# Patient Record
Sex: Female | Born: 1955 | Race: White | Hispanic: No | Marital: Married | Smoking: Current every day smoker
Health system: Southern US, Community
[De-identification: ages and names within clinical notes are randomized; demographics above are authoritative.]

## PROBLEM LIST (undated history)

## (undated) ENCOUNTER — Emergency Department (HOSPITAL_BASED_OUTPATIENT_CLINIC_OR_DEPARTMENT_OTHER): Admission: EM | Payer: Medicare Other | Source: Home / Self Care

## (undated) DIAGNOSIS — M519 Unspecified thoracic, thoracolumbar and lumbosacral intervertebral disc disorder: Secondary | ICD-10-CM

## (undated) DIAGNOSIS — F419 Anxiety disorder, unspecified: Secondary | ICD-10-CM

## (undated) DIAGNOSIS — R911 Solitary pulmonary nodule: Secondary | ICD-10-CM

## (undated) DIAGNOSIS — N390 Urinary tract infection, site not specified: Secondary | ICD-10-CM

## (undated) DIAGNOSIS — F909 Attention-deficit hyperactivity disorder, unspecified type: Secondary | ICD-10-CM

## (undated) DIAGNOSIS — F319 Bipolar disorder, unspecified: Secondary | ICD-10-CM

## (undated) DIAGNOSIS — F329 Major depressive disorder, single episode, unspecified: Secondary | ICD-10-CM

## (undated) DIAGNOSIS — N189 Chronic kidney disease, unspecified: Secondary | ICD-10-CM

## (undated) DIAGNOSIS — I728 Aneurysm of other specified arteries: Secondary | ICD-10-CM

## (undated) DIAGNOSIS — G8929 Other chronic pain: Secondary | ICD-10-CM

## (undated) DIAGNOSIS — M47812 Spondylosis without myelopathy or radiculopathy, cervical region: Secondary | ICD-10-CM

## (undated) DIAGNOSIS — J439 Emphysema, unspecified: Secondary | ICD-10-CM

## (undated) DIAGNOSIS — M545 Low back pain: Secondary | ICD-10-CM

## (undated) HISTORY — DX: Unspecified thoracic, thoracolumbar and lumbosacral intervertebral disc disorder: M51.9

## (undated) HISTORY — PX: SHOULDER SURGERY: SHX246

## (undated) HISTORY — PX: APPENDECTOMY: SHX54

## (undated) HISTORY — DX: Major depressive disorder, single episode, unspecified: F32.9

## (undated) HISTORY — DX: Spondylosis without myelopathy or radiculopathy, cervical region: M47.812

## (undated) HISTORY — DX: Urinary tract infection, site not specified: N39.0

## (undated) HISTORY — DX: Attention-deficit hyperactivity disorder, unspecified type: F90.9

## (undated) HISTORY — DX: Anxiety disorder, unspecified: F41.9

## (undated) HISTORY — DX: Low back pain: M54.5

## (undated) HISTORY — DX: Other chronic pain: G89.29

## (undated) HISTORY — PX: COLONOSCOPY: SHX174

## (undated) HISTORY — PX: CHOLECYSTECTOMY: SHX55

---

## 1990-09-04 HISTORY — PX: OTHER SURGICAL HISTORY: SHX169

## 1993-09-04 HISTORY — PX: NECK SURGERY: SHX720

## 1995-09-05 HISTORY — PX: ABDOMINAL HYSTERECTOMY: SHX81

## 2000-07-16 ENCOUNTER — Encounter: Payer: Self-pay | Admitting: Emergency Medicine

## 2000-07-16 ENCOUNTER — Emergency Department (HOSPITAL_COMMUNITY): Admission: EM | Admit: 2000-07-16 | Discharge: 2000-07-16 | Payer: Self-pay | Admitting: Emergency Medicine

## 2000-09-21 ENCOUNTER — Encounter: Admission: RE | Admit: 2000-09-21 | Discharge: 2000-09-21 | Payer: Self-pay | Admitting: Family Medicine

## 2000-10-12 ENCOUNTER — Encounter: Admission: RE | Admit: 2000-10-12 | Discharge: 2000-10-12 | Payer: Self-pay | Admitting: Family Medicine

## 2000-11-09 ENCOUNTER — Encounter: Payer: Self-pay | Admitting: Family Medicine

## 2000-11-09 ENCOUNTER — Encounter: Admission: RE | Admit: 2000-11-09 | Discharge: 2000-11-09 | Payer: Self-pay | Admitting: Family Medicine

## 2000-12-13 ENCOUNTER — Encounter
Admission: RE | Admit: 2000-12-13 | Discharge: 2000-12-13 | Payer: Self-pay | Admitting: Physical Medicine and Rehabilitation

## 2000-12-13 ENCOUNTER — Encounter: Payer: Self-pay | Admitting: Physical Medicine and Rehabilitation

## 2001-01-23 ENCOUNTER — Emergency Department (HOSPITAL_COMMUNITY): Admission: EM | Admit: 2001-01-23 | Discharge: 2001-01-23 | Payer: Self-pay | Admitting: Emergency Medicine

## 2001-01-23 ENCOUNTER — Encounter: Payer: Self-pay | Admitting: Emergency Medicine

## 2001-10-07 ENCOUNTER — Emergency Department (HOSPITAL_COMMUNITY): Admission: EM | Admit: 2001-10-07 | Discharge: 2001-10-07 | Payer: Self-pay | Admitting: *Deleted

## 2002-04-20 ENCOUNTER — Emergency Department (HOSPITAL_COMMUNITY): Admission: EM | Admit: 2002-04-20 | Discharge: 2002-04-20 | Payer: Self-pay | Admitting: Emergency Medicine

## 2003-09-05 HISTORY — PX: OTHER SURGICAL HISTORY: SHX169

## 2006-01-05 ENCOUNTER — Ambulatory Visit (HOSPITAL_COMMUNITY): Admission: RE | Admit: 2006-01-05 | Discharge: 2006-01-06 | Payer: Self-pay | Admitting: General Surgery

## 2006-01-05 ENCOUNTER — Encounter (INDEPENDENT_AMBULATORY_CARE_PROVIDER_SITE_OTHER): Payer: Self-pay | Admitting: Specialist

## 2006-01-05 IMAGING — RF DG CHOLANGIOGRAM OPERATIVE
1 series · 4 of 4 positions shown · non-contrast
Comparison: none

CLINICAL DATA: Cholecystectomy for gallstones.  
INTRAOPERATIVE CHOLANGIOGRAM ? [DATE]:
Cholangiogram images submitted for interpretation were performed with a C-arm in the operating room.  Opacified biliary tree shows no filling defects or obstruction.   of contrast into the duodenum.  No extravasation.

[Series 1: run · 4 of 95 frames shown]
[frame 15/95]
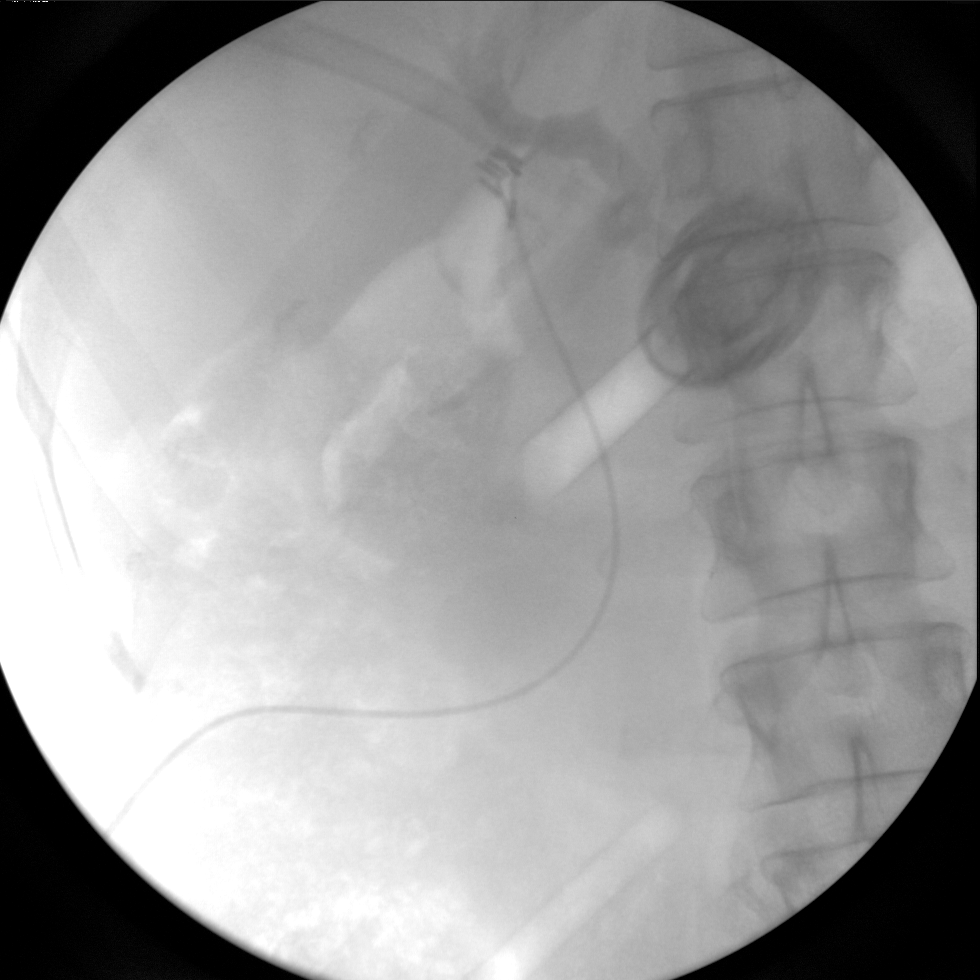
[frame 48/95]
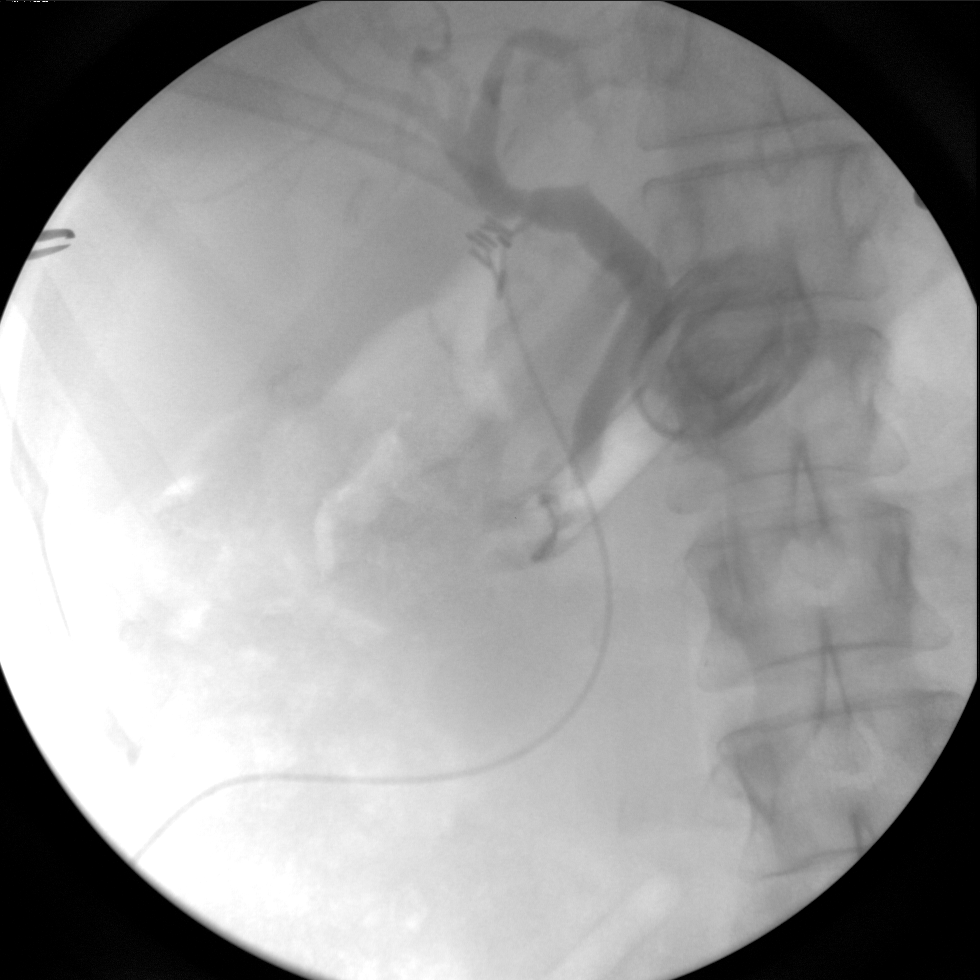
[frame 70/95]
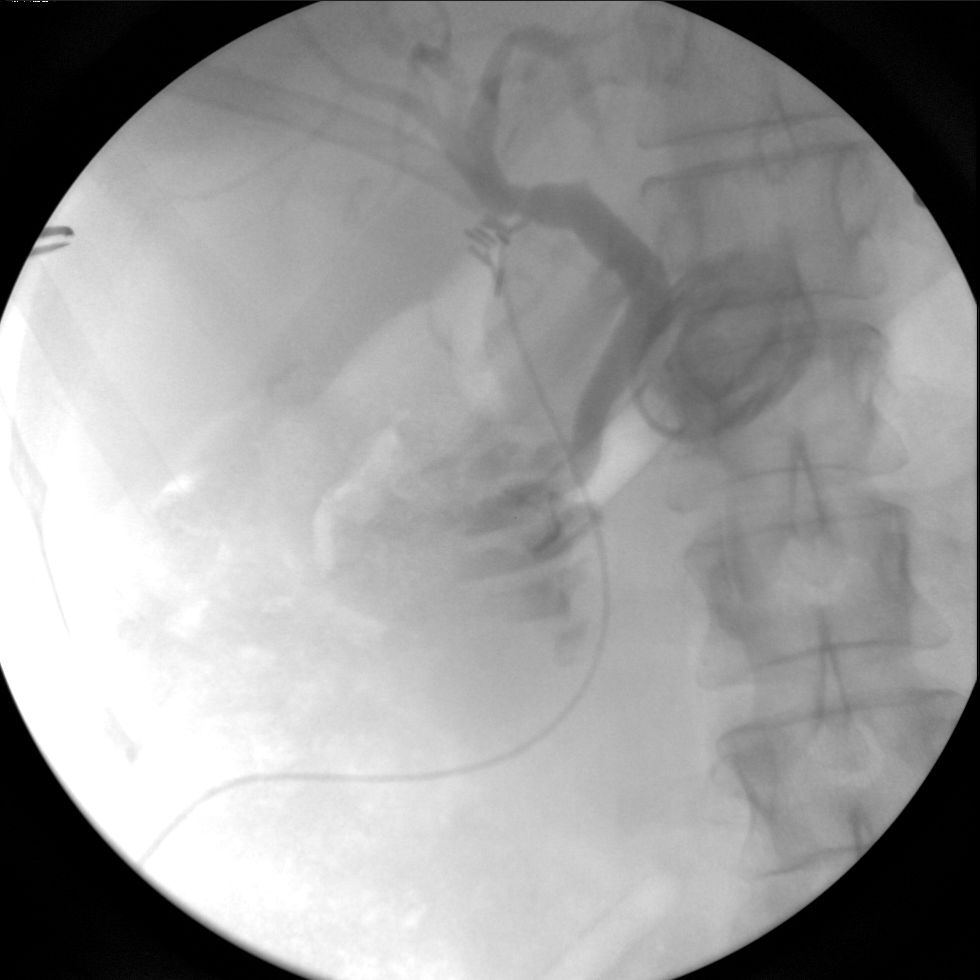
[frame 81/95]
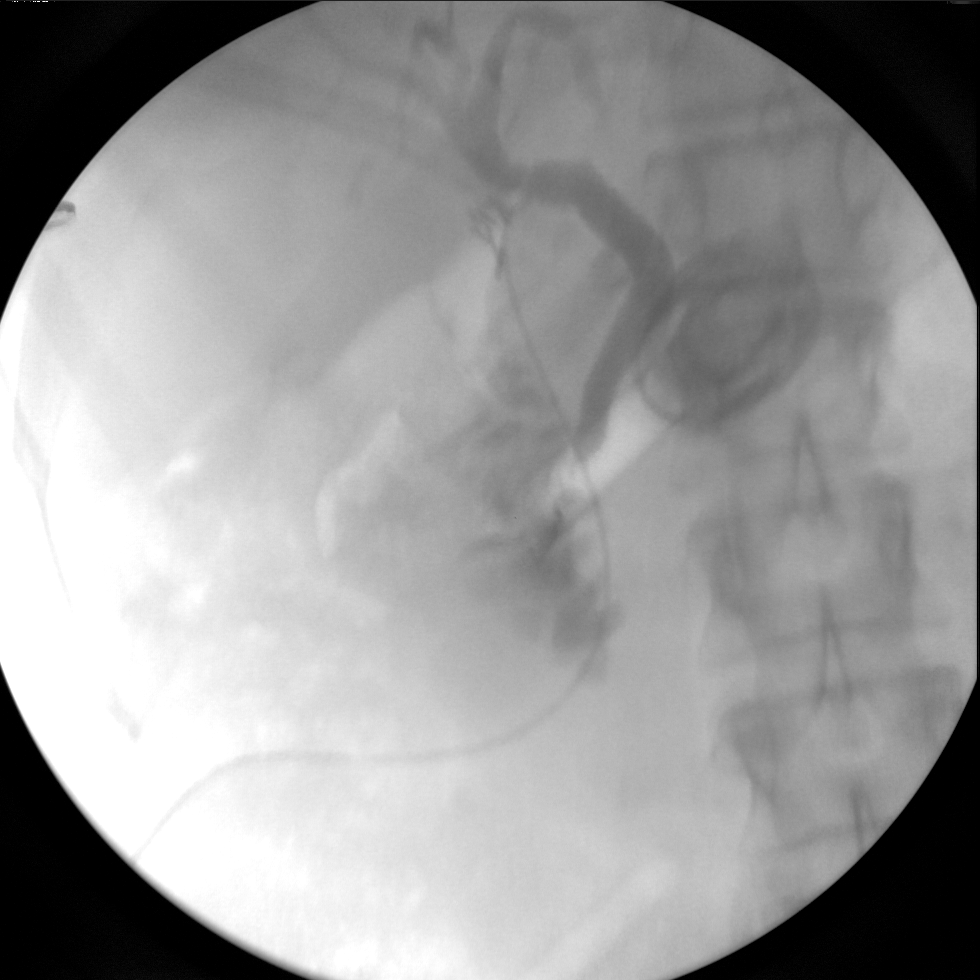

[4 of 4 positions shown; findings below may reference images not displayed]

IMPRESSION: Normal intraoperative cholangiogram.

## 2008-12-26 ENCOUNTER — Emergency Department (HOSPITAL_COMMUNITY): Admission: EM | Admit: 2008-12-26 | Discharge: 2008-12-26 | Payer: Self-pay | Admitting: Emergency Medicine

## 2009-03-04 DIAGNOSIS — F411 Generalized anxiety disorder: Secondary | ICD-10-CM | POA: Insufficient documentation

## 2009-03-04 DIAGNOSIS — F988 Other specified behavioral and emotional disorders with onset usually occurring in childhood and adolescence: Secondary | ICD-10-CM | POA: Insufficient documentation

## 2009-03-04 DIAGNOSIS — F329 Major depressive disorder, single episode, unspecified: Secondary | ICD-10-CM | POA: Insufficient documentation

## 2009-03-18 ENCOUNTER — Encounter: Admission: RE | Admit: 2009-03-18 | Discharge: 2009-03-18 | Payer: Self-pay | Admitting: Family Medicine

## 2009-10-25 ENCOUNTER — Encounter (INDEPENDENT_AMBULATORY_CARE_PROVIDER_SITE_OTHER): Payer: Self-pay | Admitting: *Deleted

## 2009-10-25 ENCOUNTER — Ambulatory Visit: Payer: Self-pay | Admitting: Internal Medicine

## 2009-11-04 ENCOUNTER — Ambulatory Visit: Payer: Self-pay | Admitting: Internal Medicine

## 2009-11-04 ENCOUNTER — Telehealth: Payer: Self-pay | Admitting: Internal Medicine

## 2010-02-10 ENCOUNTER — Emergency Department (HOSPITAL_COMMUNITY): Admission: EM | Admit: 2010-02-10 | Discharge: 2010-02-10 | Payer: Self-pay | Admitting: Emergency Medicine

## 2010-02-10 IMAGING — CR DG FOOT COMPLETE 3+V*L*
3 series · 3 of 3 positions shown · non-contrast
Comparison: None.

CLINICAL DATA: Foot pain and swelling following injury today.

LEFT FOOT - COMPLETE 3+ VIEW

[t foot ap left]
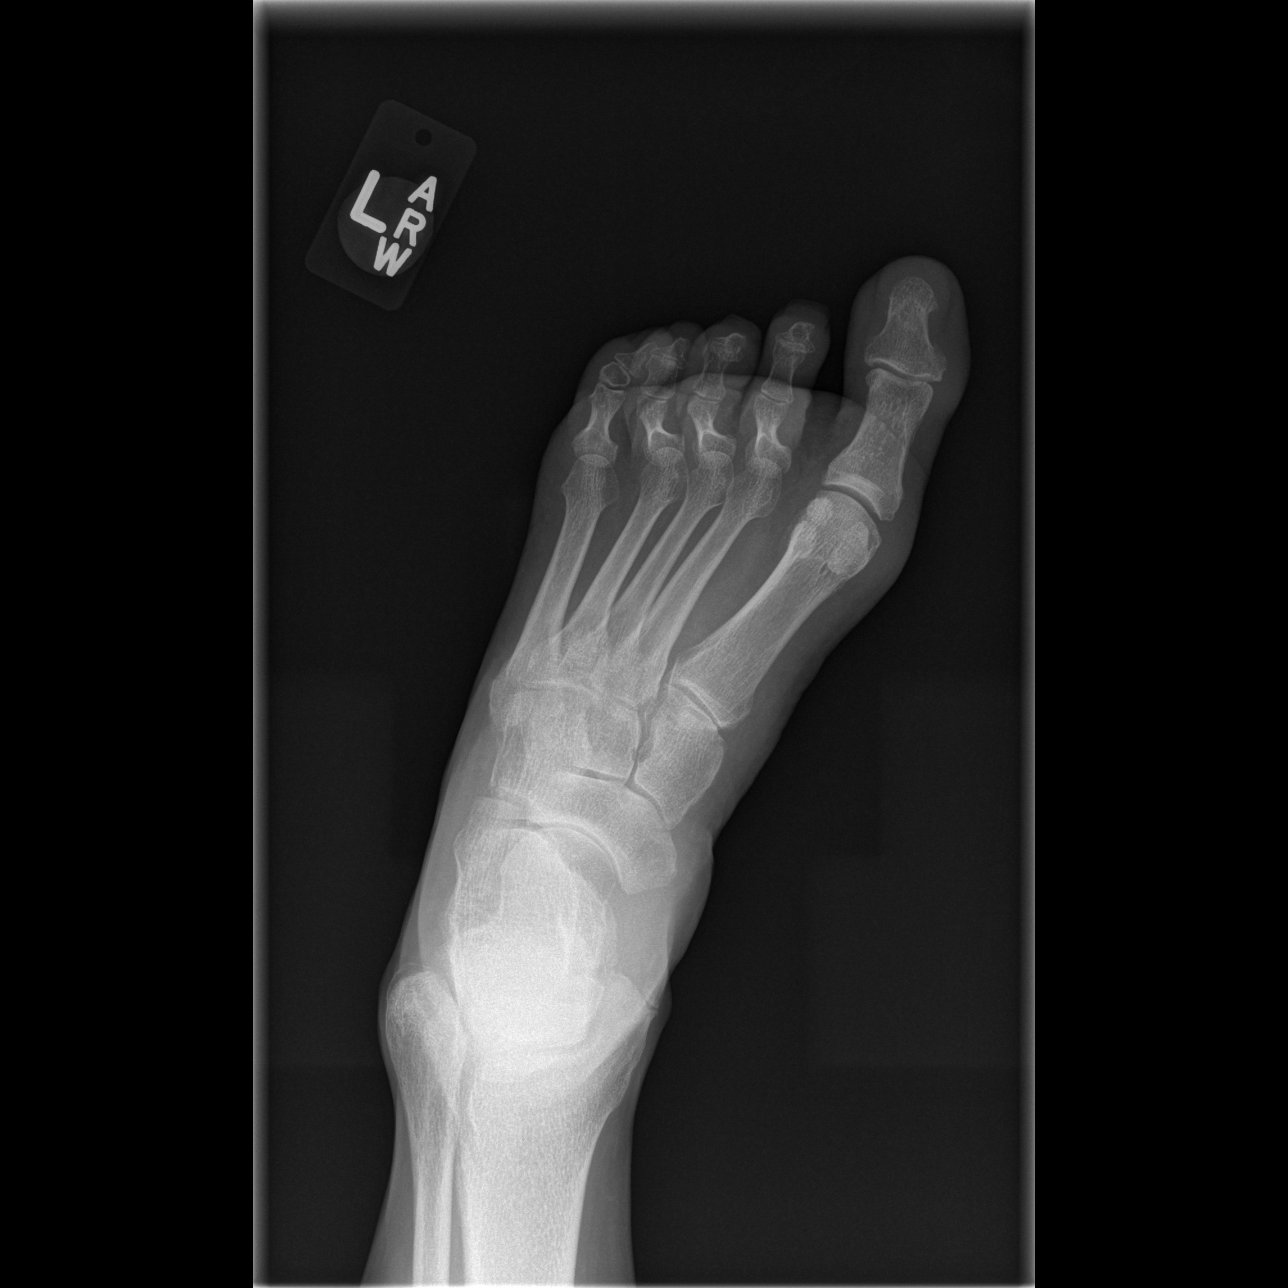

[t foot oblique left]
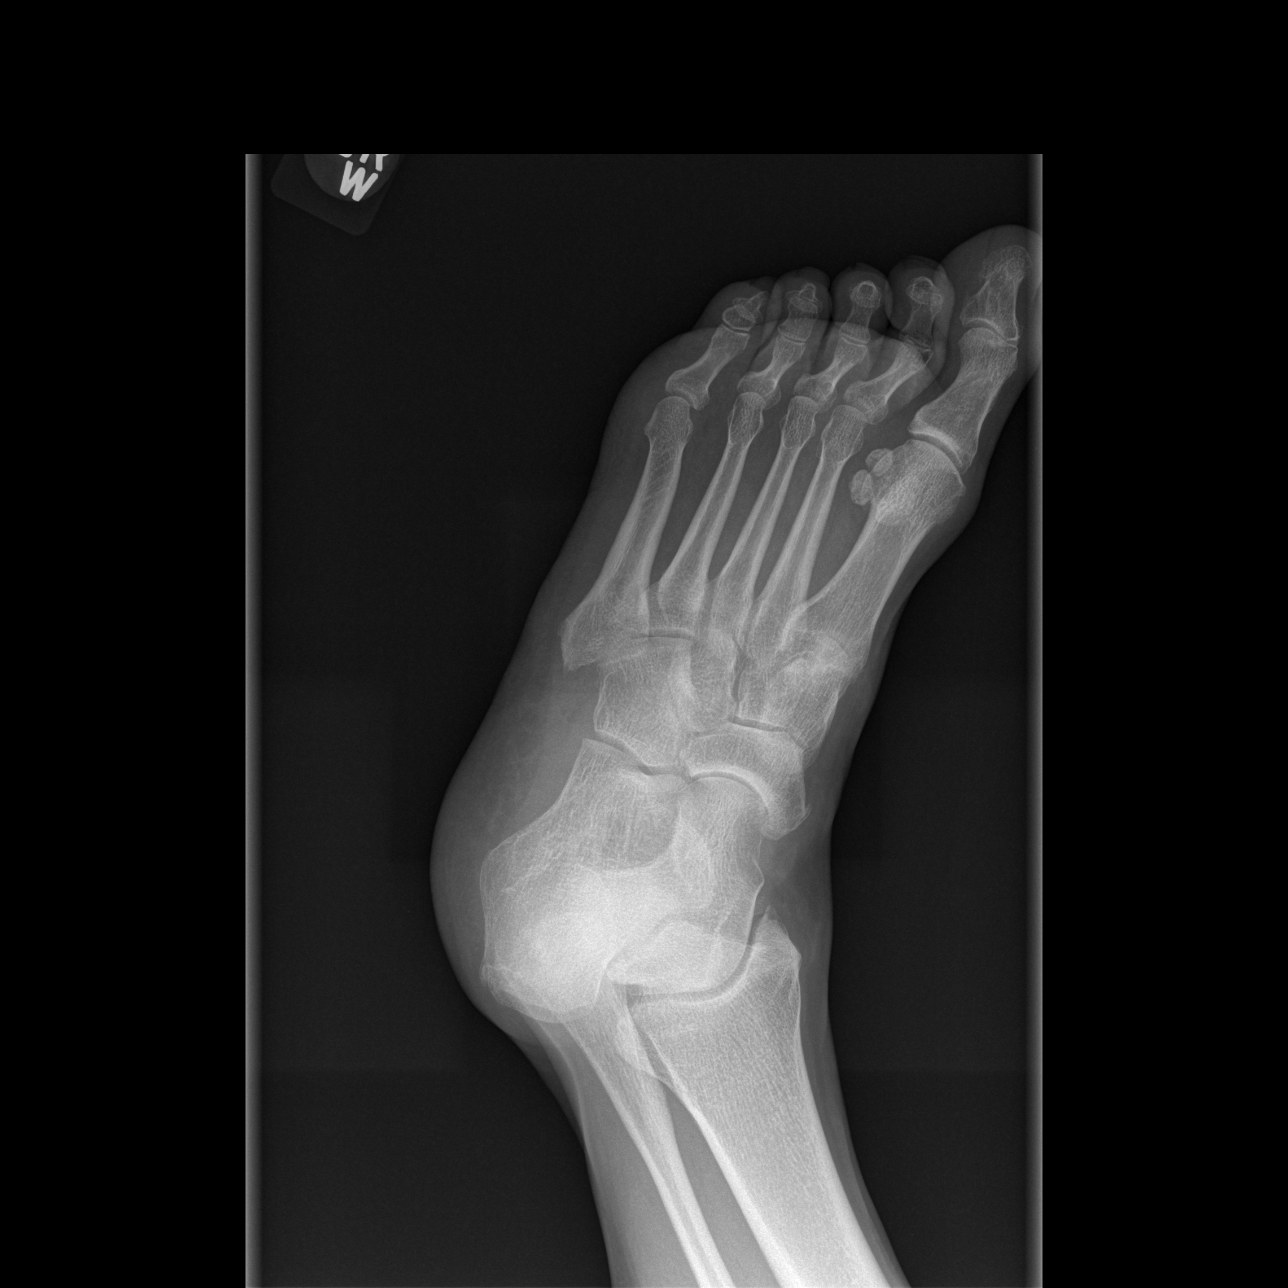

[t foot lat left]
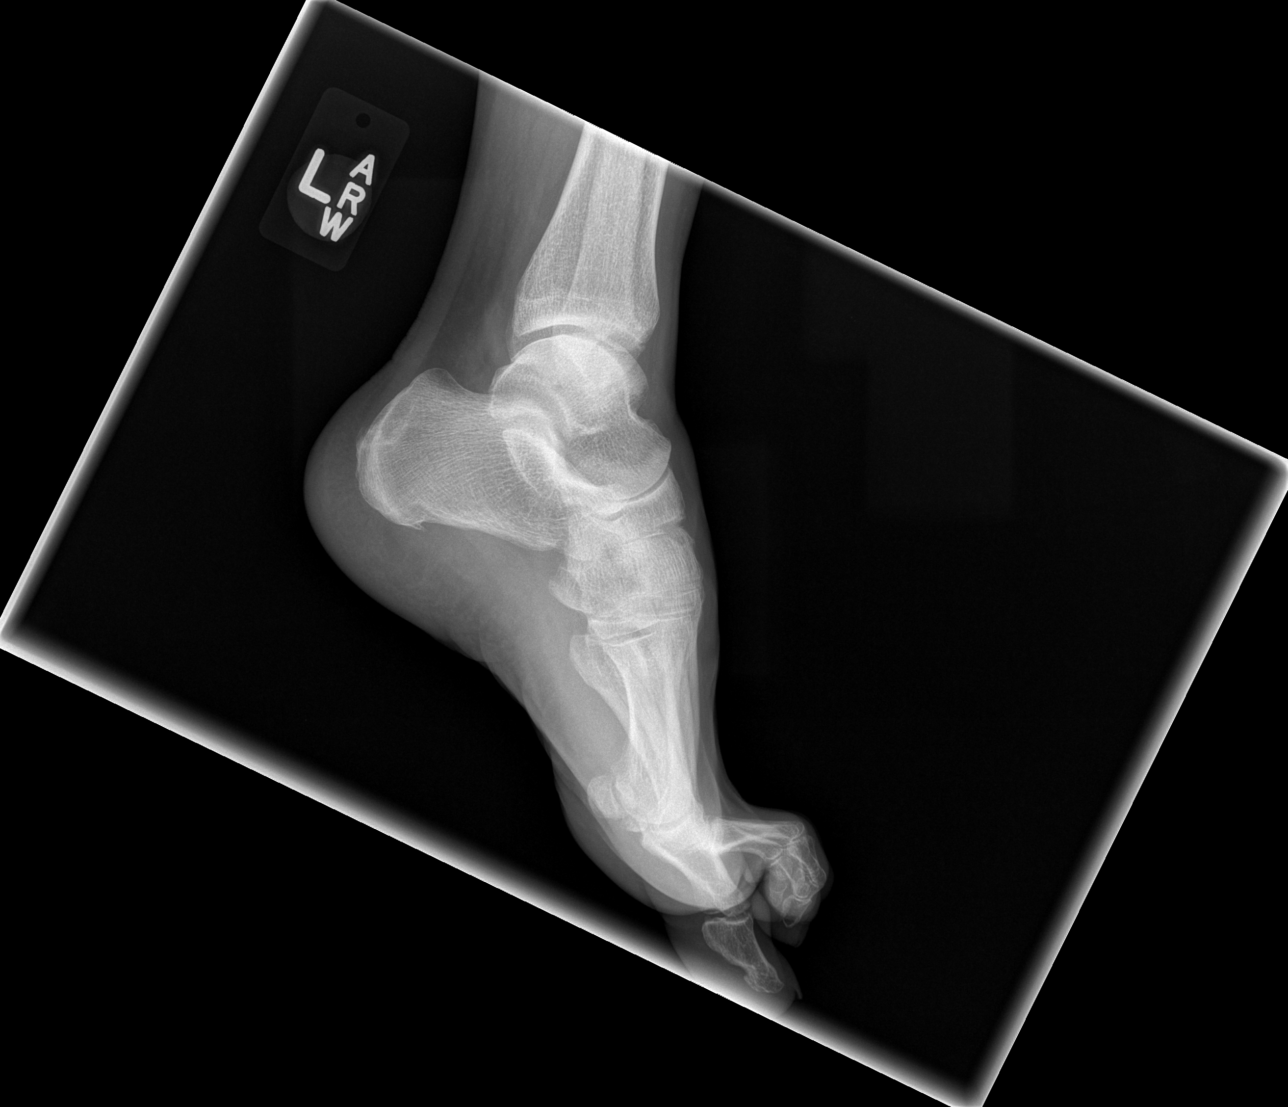

[3 of 3 positions shown; findings below may reference images not displayed]

FINDINGS: There is a high arch.  Mineralization and alignment are
normal.  There is no evidence of acute fracture or dislocation.
The lateral sesamoid of the first metatarsal appears bipartite.  No
acute soft tissue findings are evident.
IMPRESSION: No acute osseous findings.

## 2010-03-04 LAB — HM MAMMOGRAPHY: HM Mammogram: NORMAL

## 2010-10-04 NOTE — Miscellaneous (Signed)
Summary: previsit/rm  Clinical Lists Changes  Medications: Added new medication of OSMOPREP 1.102-0.398 GM  TABS (SOD PHOS MONO-SOD PHOS DIBASIC) As per prep instructions. - Signed Rx of OSMOPREP 1.102-0.398 GM  TABS (SOD PHOS MONO-SOD PHOS DIBASIC) As per prep instructions.;  #32 x 0;  Signed;  Entered by: Sherren Kerns RN;  Authorized by: Hilarie Fredrickson MD;  Method used: Electronically to [facility] [address] [place], [place]  [address], Ph: [phone] or [phone], Fax: [phone] Allergies: Added new allergy or adverse reaction of SULFA Added new allergy or adverse reaction of ASPIRIN Observations: Added new observation of ALLERGY REV: Done (10/25/2009 10:45) Added new observation of NKA: F (10/25/2009 10:45)    Prescriptions: OSMOPREP 1.102-0.398 GM  TABS (SOD PHOS MONO-SOD PHOS DIBASIC) As per prep instructions.  #32 x 0   Entered by:   Sherren Kerns RN   Authorized by:   Hilarie Fredrickson MD   Signed by:   Sherren Kerns RN on 10/25/2009   Method used:   Electronically to        [facility]* (retail)       [address] [place], [place]  [address]       Ph: [phone] or [phone]       Fax: [phone]   RxID:   [id number]

## 2010-10-04 NOTE — Procedures (Signed)
Summary: Colonoscopy  Patient: [name] Note: All result statuses are Final unless otherwise noted.  Tests: (1) Colonoscopy (COL)   COL Colonoscopy           DONE     [facility]     [address]     [place], [place]  [address]           COLONOSCOPY PROCEDURE REPORT           PATIENT:  Diana Alvarado, Diana Alvarado  MR#:  [id number]     BIRTHDATE:  02-Nov-1955, 53 yrs. old  GENDER:  female           ENDOSCOPIST:  Wilhemina Bonito. Eda Keys, MD     Referred by:  .Direct Self,           PROCEDURE DATE:  11/04/2009     PROCEDURE:  Average-risk screening colonoscopy     G0121     ASA CLASS:  Class II     INDICATIONS:  Routine Risk Screening           MEDICATIONS:   Fentanyl 75 mcg IV, Versed 8 mg IV, Benadryl 50 mg     IV           DESCRIPTION OF PROCEDURE:   After the risks benefits and     alternatives of the procedure were thoroughly explained, informed     consent was obtained.  Digital rectal exam was performed and     revealed no abnormalities.   The LB CF-[id number] endoscope     was introduced through the anus and advanced to the cecum, which     was identified by both the appendix and ileocecal valve, limited     by poor preparation. Time to cecum = 6:27 min.   The quality of     the prep was poor, using Osmoprep.  The instrument was then     withdrawn as the colon was fully examined with great limitations.     <<PROCEDUREIMAGES>>           FINDINGS:  Poor prep limited this examination.   Retroflexed views     in the rectum revealed no abnormalities.    The scope was then     withdrawn from the patient and the procedure completed.           COMPLICATIONS:  None           ENDOSCOPIC IMPRESSION:     1) Poor prep (exam limited). Exam inadequate     RECOMMENDATIONS:     1) RECOMMEND REPEATING SCREENING COLONOSCOPY IN ONE YEAR.     RECOMMEND SPLIT PREP (MOVI PREP) WITH STRICT ATTENTION TO THE     INSTRUCTIONS           ______________________________     Wilhemina Bonito. Eda Keys, MD          CC:  Cannon Kettle NP;  The Patient           n.     eSIGNED:   Timica Marcom N. Eda Keys at 11/04/2009 12:51 PM           Dawayne Cirri, [id number]  Note: An exclamation mark (!) indicates a result that was not dispersed into the flowsheet. Document Creation Date: 11/04/2009 12:51 PM _______________________________________________________________________  (1) Order result status: Final Collection or observation date-time: 11/04/2009 12:45 Requested date-time:  Receipt date-time:  Reported date-time:  Referring Physician:   Ordering Physician: Fransico Setters [id number]) Specimen Source:  Source: Launa Grill Order Number: [id number] Lab site:   Appended Document: Colonoscopy    Clinical Lists Changes  Observations: Added new observation of COLONNXTDUE: 11/2010 (11/04/2009 13:23)

## 2010-10-04 NOTE — Letter (Signed)
Summary: [facility] Instructions  [facility]  [address] [place], [place] [address]   Phone: [phone]  Fax: [phone]       Diana Alvarado    1956/03/07    MRN: [id number]        Procedure Day /Date:  thursday 11/04/09     Arrival Time: 10:30 am     Procedure Time: 11:30 am     Location of Procedure:                    _x_  [facility] (4th Floor)     PREPARATION FOR COLONOSCOPY WITH OSMOPREP  Starting 5 days prior to your procedure  10/30/09 do not eat nuts, seeds, popcorn, corn, beans, peas,  salads, or any raw vegetables.  Do not take any fiber supplements (e.g. Metamucil, Citrucel, and Benefiber). _________________________________________________________________________________________________  THE DAY BEFORE YOUR PROCEDURE             DATE: 11/03/09    DAY: Wednesday  1.   Drink clear liquids the entire day - NO SOLID FOOD.  Drink at least 64 oz. of fluid during the day to prevent hydration and help the prep work efficiently.    2.   Do not drink anything colored red or purple.  Avoid juices with pulp.  No orange juice.              CLEAR LIQUIDS INCLUDE: Water Jello Ice Popsicles Tea (sugar ok, no milk/cream) Powdered fruit flavored drinks Coffee (sugar ok, no milk/cream) Gatorade Juice: apple, white grape, white cranberry  Lemonade Clear bullion, consomm, broth(low sodium) Carbonated beverages (any kind) Strained chicken noodle soup Hard Candy   3.   Beginning at 5:00 p.m. or 6:00 p.m. the night before your procedure, drink one dose (4 tablets with 8 oz. of any clear liquid) every 15 minutes for a total of 5 doses (20 tablets total).  ___________________________________________________________________________________________________   THE DAY OF YOUR PROCEDURE            DATE: 11/04/09   DAY: Thursday  1.   Beginning at 6:30 am (5 hours before procedure), drink one dose (4 tablets with 8 oz. of any clear liquid) every 15 minutes  for a total of 3 doses (12 tablets).  2.   You may drink clear liquids until  9:30 am (2 hours before exam).  Do not drink anything after this time.       MEDICATION INSTRUCTIONS  Unless otherwise instructed, you should take regular prescription medications with a small sip of water as early as possible the morning of your procedure.          Additional medication instructions: n/a          OTHER INSTRUCTIONS  You will need a responsible adult at least 54 years of age to accompany you and drive you home.   This person must remain in the waiting room during your procedure.  Wear loose fitting clothing that is easily removed.  Leave jewelry and other valuables at home.  However, you may wish to bring a book to read or an iPod/MP3 player to listen to music as you wait for your procedure to start.  Remove all body piercing jewelry and leave at home.  Total time from sign-in until discharge is approximately 2-3 hours.  You should go home directly after your procedure and rest.  You can resume normal activities the day after your procedure.  The day of your procedure  you should not:   Drive   Make legal decisions   Operate machinery   Drink alcohol   Return to work  You will receive specific instructions about eating, activities and medications before you leave.   The above instructions have been reviewed and explained to me by  Sherren Kerns RN  October 25, 2009 11:52 AM     I fully understand and can verbalize these instructions _____________________________ Date _________

## 2010-10-04 NOTE — Progress Notes (Signed)
Summary: prep prblems  Phone Note Call from Patient   Summary of Call: has not mved bowels yet after first part of osmoprep mild distress but no nausea vamiting or sig pain advised go ahead with second half of prep and take 1-2 fleets enema and we would call this AM to reassess  Initial call taken by: Iva Boop MD, Clementeen Graham,  November 04, 2009 7:20 AM  Follow-up for Phone Call        Spoke with pt stated her prep started working at 07:30- Dr Marina Goodell informed pt told to drink plenty of wateruntil 2 hours prior to procedure and come on in. Pt reassured. Follow-up by: Oda Cogan,  November 04, 2009 8:29 AM

## 2010-12-22 ENCOUNTER — Encounter: Payer: Self-pay | Admitting: Internal Medicine

## 2011-01-20 NOTE — Op Note (Signed)
NAME:  Diana Alvarado, Diana Alvarado                  ACCOUNT NO.:  [id number]   MEDICAL RECORD NO.:  [id number]          PATIENT TYPE:  AMB   LOCATION:  SDS                          FACILITY:  [facility]   PHYSICIAN:  Gita Kudo, M.D. DATE OF BIRTH:  04-29-1956   DATE OF PROCEDURE:  DATE OF DISCHARGE:                                 OPERATIVE REPORT   OPERATIVE PROCEDURE:  Laparoscopic cholecystectomy with intraoperative  cholangiogram.   SURGEON:  Dr. Jerelene Redden.   ASSISTANT:  Dr. Cherylynn Ridges.   ANESTHESIA:  General endotracheal.   PREOPERATIVE DIAGNOSIS:  Gallstones.   POSTOPERATIVE DIAGNOSIS:  Gallstones.   CLINICAL SUMMARY:  [name] is a 55 year old woman with chronic abdominal  pain and urinary tract infections.  While being worked up, renal ultrasound  was obtained and also demonstrated gallstones.  In retrospect, she does have  some symptoms of cholecystitis and is brought in for elective  cholecystectomy.   OPERATIVE FINDINGS:  Her liver function studies were normal.  The  gallbladder ultrasound showed stones and small stones were felt.  The  cholangiogram looked normal.   OPERATIVE PROCEDURE:  Under satisfactory general endotracheal anesthesia,  having received 1.0 grams Ancef preop, the patient was positioned, prepped  and draped in the standard fashion.  A total of 30 mL of 0.5% Marcaine with  epinephrine was infiltrated for postop analgesia.  A midline incision made  at the umbilicus inferiorly and carried into the peritoneum through the  midline.  A figure-of-eight #0 Vicryl suture used to control this incision  and a office Hussan port inserted and secured.  Then two #5 ports placed  laterally and a second #10 medially.  Visualization was good.  Lateral  graspers gave excellent exposure and operating through the medial port, I  took down filmy adhesions to the gallbladder.  With lateral retraction, the  cystic duct and artery were each circumferentially dissected and  definitely  identified and then controlled with multiple clips on the artery which was  divided and a single clip on the duct near the gallbladder.  Incision made  in the duct and a percutaneous catheter placed into it and cholangiogram  obtained.  Catheter withdrawn when we reviewed the cholangiogram which  showed no obstruction or defect.  Then the duct was controlled with multiple  clips and divided.  Bleeding sites were controlled with cautery and then the  gallbladder removed from below upward with cautery.  The liver bed was made  hemostatic by cautery or clips and lavaged with saline and suctioned dry.  Following that, the camera moved to the upper port and a large grasper  placed through the umbilicus and the gallbladder removed intact, without  spillage or problem.  The operative site again checked, lavaged with saline  and suctioned dry and then ports and CO2 released.  Midline closed with a previous figure-of-eight Vicryl and a second  interrupted Vicryl suture.  Then the skin edges approximated with Steri-  Strips after 4-0 Vicryl for subcu.  Sterile absorbent dressings were applied  and the patient went to the recovery  room from the operating room in good  condition without complication.           ______________________________  Gita Kudo, M.D.     MRL/MEDQ  D:  01/05/2006  T:  01/05/2006  Job:  [id number]   cc:   Nolon Nations, MD   Bertram Millard. Dahlstedt, M.D.  Fax: [phone]

## 2011-02-15 ENCOUNTER — Encounter: Payer: Self-pay | Admitting: Internal Medicine

## 2011-04-19 ENCOUNTER — Other Ambulatory Visit (INDEPENDENT_AMBULATORY_CARE_PROVIDER_SITE_OTHER): Payer: Medicare Other

## 2011-04-19 ENCOUNTER — Encounter: Payer: Self-pay | Admitting: Internal Medicine

## 2011-04-19 ENCOUNTER — Ambulatory Visit (INDEPENDENT_AMBULATORY_CARE_PROVIDER_SITE_OTHER): Payer: Medicare Other | Admitting: Internal Medicine

## 2011-04-19 VITALS — BP 120/80 | HR 100 | Temp 97.0°F | Ht 61.0 in | Wt 134.2 lb

## 2011-04-19 DIAGNOSIS — M519 Unspecified thoracic, thoracolumbar and lumbosacral intervertebral disc disorder: Secondary | ICD-10-CM

## 2011-04-19 DIAGNOSIS — F32A Depression, unspecified: Secondary | ICD-10-CM

## 2011-04-19 DIAGNOSIS — J45909 Unspecified asthma, uncomplicated: Secondary | ICD-10-CM

## 2011-04-19 DIAGNOSIS — Z0000 Encounter for general adult medical examination without abnormal findings: Secondary | ICD-10-CM

## 2011-04-19 DIAGNOSIS — M545 Low back pain, unspecified: Secondary | ICD-10-CM

## 2011-04-19 DIAGNOSIS — G8929 Other chronic pain: Secondary | ICD-10-CM

## 2011-04-19 DIAGNOSIS — Z79899 Other long term (current) drug therapy: Secondary | ICD-10-CM

## 2011-04-19 DIAGNOSIS — M5030 Other cervical disc degeneration, unspecified cervical region: Secondary | ICD-10-CM | POA: Insufficient documentation

## 2011-04-19 DIAGNOSIS — M47812 Spondylosis without myelopathy or radiculopathy, cervical region: Secondary | ICD-10-CM

## 2011-04-19 HISTORY — DX: Unspecified thoracic, thoracolumbar and lumbosacral intervertebral disc disorder: M51.9

## 2011-04-19 HISTORY — DX: Spondylosis without myelopathy or radiculopathy, cervical region: M47.812

## 2011-04-19 HISTORY — DX: Other chronic pain: G89.29

## 2011-04-19 HISTORY — DX: Depression, unspecified: F32.A

## 2011-04-19 HISTORY — DX: Low back pain, unspecified: M54.50

## 2011-04-19 LAB — BASIC METABOLIC PANEL
BUN: 16 mg/dL (ref 6–23)
CO2: 32 mEq/L (ref 19–32)
Calcium: 9.1 mg/dL (ref 8.4–10.5)
Chloride: 106 mEq/L (ref 96–112)
Creatinine, Ser: 0.8 mg/dL (ref 0.4–1.2)
GFR: 76.21 mL/min (ref >60.00)
Glucose, Bld: 91 mg/dL (ref 70–99)
Potassium: 4.5 mEq/L (ref 3.5–5.1)
Sodium: 143 mEq/L (ref 135–145)

## 2011-04-19 LAB — URINALYSIS, ROUTINE W REFLEX MICROSCOPIC
Bilirubin Urine: NEGATIVE
Hgb urine dipstick: NEGATIVE
Ketones, ur: NEGATIVE
Leukocytes, UA: NEGATIVE
Nitrite: NEGATIVE
Specific Gravity, Urine: >=1.03 (ref 1.000–1.030)
Total Protein, Urine: NEGATIVE
Urine Glucose: NEGATIVE
Urobilinogen, UA: 0.2 (ref 0.0–1.0)
pH: 5 (ref 5.0–8.0)

## 2011-04-19 LAB — LIPID PANEL
Cholesterol: 197 mg/dL (ref 0–200)
HDL: 82 mg/dL (ref >39.00)
LDL Cholesterol: 103 mg/dL — ABNORMAL HIGH (ref 0–99)
Total CHOL/HDL Ratio: 2
Triglycerides: 58 mg/dL (ref 0.0–149.0)
VLDL: 11.6 mg/dL (ref 0.0–40.0)

## 2011-04-19 LAB — CBC WITH DIFFERENTIAL/PLATELET
Basophils Absolute: 0 10*3/uL (ref 0.0–0.1)
Basophils Relative: 0.5 % (ref 0.0–3.0)
Eosinophils Absolute: 0.1 10*3/uL (ref 0.0–0.7)
Eosinophils Relative: 1.8 % (ref 0.0–5.0)
HCT: 40.1 % (ref 36.0–46.0)
Hemoglobin: 13.6 g/dL (ref 12.0–15.0)
Lymphocytes Relative: 40.2 % (ref 12.0–46.0)
Lymphs Abs: 2.1 10*3/uL (ref 0.7–4.0)
MCHC: 33.9 g/dL (ref 30.0–36.0)
MCV: 95.1 fl (ref 78.0–100.0)
Monocytes Absolute: 0.3 10*3/uL (ref 0.1–1.0)
Monocytes Relative: 5.4 % (ref 3.0–12.0)
Neutro Abs: 2.7 10*3/uL (ref 1.4–7.7)
Neutrophils Relative %: 52.1 % (ref 43.0–77.0)
Platelets: 175 10*3/uL (ref 150.0–400.0)
RBC: 4.22 Mil/uL (ref 3.87–5.11)
RDW: 13.8 % (ref 11.5–14.6)
WBC: 5.1 10*3/uL (ref 4.5–10.5)

## 2011-04-19 LAB — HEPATIC FUNCTION PANEL
ALT: 47 U/L — ABNORMAL HIGH (ref 0–35)
AST: 25 U/L (ref 0–37)
Albumin: 4.4 g/dL (ref 3.5–5.2)
Alkaline Phosphatase: 104 U/L (ref 39–117)
Bilirubin, Direct: 0.1 mg/dL (ref 0.0–0.3)
Total Bilirubin: 0.6 mg/dL (ref 0.3–1.2)
Total Protein: 7.4 g/dL (ref 6.0–8.3)

## 2011-04-19 LAB — TSH: TSH: 1 u[IU]/mL (ref 0.35–5.50)

## 2011-04-19 MED ORDER — AMPHETAMINE-DEXTROAMPHETAMINE 30 MG PO TABS: 30.0000 mg | ORAL_TABLET | Freq: Two times a day (BID) | ORAL | Status: DC

## 2011-04-19 MED ORDER — FENTANYL 100 MCG/HR TD PT72: MEDICATED_PATCH | TRANSDERMAL | Status: DC

## 2011-04-19 MED ORDER — DULOXETINE HCL 60 MG PO CPEP: 60.0000 mg | ORAL_CAPSULE | Freq: Every day | ORAL | Status: DC

## 2011-04-19 MED ORDER — ARIPIPRAZOLE 10 MG PO TABS: 10.0000 mg | ORAL_TABLET | Freq: Every day | ORAL | Status: DC

## 2011-04-19 MED ORDER — ALPRAZOLAM 1 MG PO TABS: 1.0000 mg | ORAL_TABLET | Freq: Two times a day (BID) | ORAL | Status: DC

## 2011-04-19 MED ORDER — OXYCODONE-ACETAMINOPHEN 10-325 MG PO TABS: 1.0000 | ORAL_TABLET | Freq: Four times a day (QID) | ORAL | Status: DC | PRN

## 2011-04-19 MED ORDER — TRAZODONE HCL 100 MG PO TABS: ORAL_TABLET | ORAL | Status: DC

## 2011-04-19 NOTE — Patient Instructions (Addendum)
Please remember to followup with your GYN for the yearly pap smear and/or mammogram Please call the number on the recall letter to schedule your colonoscopy Please consider contacting your insurance to see if they cover the shingles shot Please return at your convnience for the flu shot Please go to LAB in the Basement for the blood and/or urine tests to be done today Please call the phone number [phone] (the PhoneTree System) for results of testing in 2-3 days;  When calling, simply dial the number, and when prompted enter the MRN number above (the Medical Record Number) and the # key, then the message should start. All medications were refilled today, and sent to the pharmacy except for the controlled substances Please continue to see Dr Ethelene Hal for your ongoing pain needs Please return in 1 year for your yearly visit, or sooner if needed, with Lab testing done 3-5 days before

## 2011-04-19 NOTE — Assessment & Plan Note (Signed)
Overall doing well, age appropriate education and counseling updated, referrals for preventative services and immunizations addressed, dietary and smoking counseling addressed, most recent labs and ECG reviewed.  I have personally reviewed and have noted: 1) the patient's medical and social history 2) The pt's use of alcohol, tobacco, and illicit drugs 3) The patient's current medications and supplements 4) Functional ability including ADL's, fall risk, home safety risk, hearing and visual impairment 5) Diet and physical activities 6) Evidence for depression or mood disorder 7) The patient's height, weight, and BMI have been recorded in the chart I have made referrals, and provided counseling and education based on review of the above Pt to call for mammogram, and f/u colonoscopy

## 2011-04-19 NOTE — Progress Notes (Signed)
Subjective:    Patient ID: Diana Alvarado, female    DOB: 07-Sep-1955, 55 y.o.   MRN: [id number]  HPI  Here for wellness and f/u;  Overall doing ok;  Pt denies CP, worsening SOB, DOE, wheezing, orthopnea, PND, worsening LE edema, palpitations, dizziness or syncope.  Pt denies neurological change such as new Headache, facial or extremity weakness.  Pt denies polydipsia, polyuria, or low sugar symptoms. Pt states overall good compliance with treatment and medications, good tolerability, and trying to follow lower cholesterol diet.  Pt denies worsening depressive symptoms, suicidal ideation or panic. No fever, wt loss, night sweats, loss of appetite, or other constitutional symptoms.  Pt states good ability with ADL's, low fall risk, home safety reviewed and adequate, no significant changes in hearing or vision, and occasionally active with exercise.  Sees Dr Ethelene Hal for ongoing back pain, no change recently, and pain relatively well controlled, disabled for about 8 yrs Past Medical History  Diagnosis Date  . Asthma 04/19/2011  . Depression 04/19/2011  . DJD (degenerative joint disease), cervical 04/19/2011  . UTI (lower urinary tract infection)   . Lumbar disc disease 04/19/2011  . Chronic LBP 04/19/2011   Past Surgical History  Procedure Date  . Gall bladder 2005  . Abdominal hysterectomy 1997  . Ruptured disc 1992    repaired ruptured disc, neck    reports that she has been smoking.  She does not have any smokeless tobacco history on file. She reports that she does not drink alcohol or use illicit drugs. family history includes Arthritis in her other; Cancer in her other; and Diabetes in her other. Allergies  Allergen Reactions  . Aspirin     REACTION: hives  . Sulfonamide Derivatives     REACTION: hives   No current outpatient prescriptions on file prior to visit.   Review of Systems Review of Systems  Constitutional: Negative for diaphoresis, activity change, appetite change and unexpected  weight change.  HENT: Negative for hearing loss, ear pain, facial swelling, mouth sores and neck stiffness.   Eyes: Negative for pain, redness and visual disturbance.  Respiratory: Negative for shortness of breath and wheezing.   Cardiovascular: Negative for chest pain and palpitations.  Gastrointestinal: Negative for diarrhea, blood in stool, abdominal distention and rectal pain.  Genitourinary: Negative for hematuria, flank pain and decreased urine volume.  Musculoskeletal: Negative for myalgias and joint swelling.  Skin: Negative for color change and wound.  Neurological: Negative for syncope and numbness.  Hematological: Negative for adenopathy.  Psychiatric/Behavioral: Negative for hallucinations, self-injury, decreased concentration and agitation.      Objective:   Physical Exam BP 120/80  Pulse 100  Temp(Src) 97 F (36.1 C) (Oral)  Ht 5\' 1"  (1.549 m)  Wt 134 lb 4 oz (60.895 kg)  BMI 25.37 kg/m2  SpO2 91% Physical Exam  VS noted, NAD, somewhat masked facies, dysphoric affect Constitutional: Pt is oriented to person, place, and time. Appears well-developed and well-nourished.  HENT:  Head: Normocephalic and atraumatic.  Right Ear: External ear normal.  Left Ear: External ear normal.  Nose: Nose normal.  Mouth/Throat: Oropharynx is clear and moist.  Eyes: Conjunctivae and EOM are normal. Pupils are equal, round, and reactive to light.  Neck: Normal range of motion. Neck supple. No JVD present. No tracheal deviation present.  Cardiovascular: Normal rate, regular rhythm, normal heart sounds and intact distal pulses.   Pulmonary/Chest: Effort normal and breath sounds normal.  Abdominal: Soft. Bowel sounds are normal. There is no tenderness.  Musculoskeletal: Normal range of motion except for worsening left LBP with left hip flexion. Exhibits no edema.  Lymphadenopathy:  Has no cervical adenopathy.  Neurological: Pt is alert and oriented to person, place, and time. Pt has  normal reflexes. No cranial nerve deficit.  Skin: Skin is warm and dry. No rash noted.       Assessment & Plan:

## 2011-07-06 ENCOUNTER — Telehealth: Payer: Self-pay | Admitting: Internal Medicine

## 2011-07-06 MED ORDER — AMPHETAMINE-DEXTROAMPHETAMINE 30 MG PO TABS: 30.0000 mg | ORAL_TABLET | Freq: Two times a day (BID) | ORAL | Status: DC

## 2011-07-06 NOTE — Telephone Encounter (Signed)
Pt informed, rx placed upfront in cabinet ready for pickup. 

## 2011-07-06 NOTE — Telephone Encounter (Signed)
Addended by: Anselm Jungling on: 07/06/2011 10:44 AM   Modules accepted: Orders

## 2011-07-06 NOTE — Telephone Encounter (Signed)
i printed 

## 2011-07-06 NOTE — Telephone Encounter (Signed)
Addended by: Romero Belling on: 07/06/2011 01:04 PM   Modules accepted: Orders

## 2011-07-06 NOTE — Telephone Encounter (Signed)
The pt is requesting an adderall refill   Thanks!

## 2011-07-11 ENCOUNTER — Other Ambulatory Visit: Payer: Self-pay

## 2011-07-11 MED ORDER — AMPHETAMINE-DEXTROAMPHETAMINE 30 MG PO TABS: 30.0000 mg | ORAL_TABLET | Freq: Two times a day (BID) | ORAL | Status: DC

## 2011-07-11 NOTE — Telephone Encounter (Signed)
Too soon for xanax ;  Should have refills to mid feb 2013

## 2011-07-11 NOTE — Telephone Encounter (Signed)
Done hardcopy to robin  

## 2011-07-11 NOTE — Telephone Encounter (Signed)
Called the patient to inform 3  Month adderral prescriptions as requested are ready for pickup at the front desk.

## 2011-07-11 NOTE — Telephone Encounter (Signed)
The patient had 1 adderal refiled while JWJ was out of town. She has not picked up yet and is requesting to add 2 more refills as usually gets a 3 month supply at one time. Also needs her xanax refilled, please advise. Call back number when prescriptions are ready is [phone]

## 2011-07-19 ENCOUNTER — Ambulatory Visit: Payer: Medicare Other

## 2011-07-21 ENCOUNTER — Ambulatory Visit: Payer: Medicare Other

## 2011-07-26 ENCOUNTER — Ambulatory Visit: Payer: Medicare Other

## 2011-07-31 ENCOUNTER — Other Ambulatory Visit: Payer: Self-pay

## 2011-08-01 ENCOUNTER — Other Ambulatory Visit: Payer: Self-pay | Admitting: Internal Medicine

## 2011-08-01 NOTE — Telephone Encounter (Signed)
Pt received 6 mo total rx starting about mid august 2012

## 2011-08-02 NOTE — Telephone Encounter (Signed)
Patient informed. 

## 2011-08-09 ENCOUNTER — Other Ambulatory Visit: Payer: Self-pay

## 2011-08-09 NOTE — Telephone Encounter (Signed)
6 mo rx done mid aug 2012;   Too soon for new rx

## 2011-08-10 NOTE — Telephone Encounter (Signed)
Patient informed. 

## 2011-09-01 ENCOUNTER — Other Ambulatory Visit: Payer: Self-pay | Admitting: *Deleted

## 2011-09-01 NOTE — Telephone Encounter (Signed)
Ok to inform pharmacy ok for early refill this time

## 2011-09-01 NOTE — Telephone Encounter (Signed)
Pt is requesting early refill of Alprazolam-pt states that she is going out of town tomorrow morning and will be out of town on January 1-please advise.

## 2011-09-04 MED ORDER — ALPRAZOLAM 1 MG PO TABS: 1.0000 mg | ORAL_TABLET | Freq: Two times a day (BID) | ORAL | Status: DC

## 2011-09-04 NOTE — Telephone Encounter (Signed)
Done hardcopy to robin  

## 2011-09-04 NOTE — Telephone Encounter (Signed)
Called the pharmacy and prescription is not too early to fill as last fill was 07/10/11,  The pharmacist stated she has no refills and needs new prescription written

## 2011-10-03 ENCOUNTER — Other Ambulatory Visit: Payer: Self-pay

## 2011-10-03 MED ORDER — AMPHETAMINE-DEXTROAMPHETAMINE 30 MG PO TABS: 30.0000 mg | ORAL_TABLET | Freq: Two times a day (BID) | ORAL | Status: DC

## 2011-10-03 NOTE — Telephone Encounter (Signed)
Called patient informed prescription requested is ready for pickup. 

## 2011-10-03 NOTE — Telephone Encounter (Signed)
Done hardcopy to robin  

## 2011-10-09 ENCOUNTER — Telehealth: Payer: Self-pay | Admitting: Internal Medicine

## 2011-10-09 MED ORDER — AMPHETAMINE-DEXTROAMPHETAMINE 30 MG PO TABS: 30.0000 mg | ORAL_TABLET | Freq: Two times a day (BID) | ORAL | Status: DC

## 2011-10-09 NOTE — Telephone Encounter (Signed)
Per pt daughter only picked up 2 adderall 30mg  twice a day prescription--usually pick up 3--

## 2011-10-09 NOTE — Telephone Encounter (Signed)
I think pt really picked up one rx of adderall  I added the next 2 for total of 3 - Done hardcopy to robin

## 2011-11-17 ENCOUNTER — Other Ambulatory Visit: Payer: Medicare Other

## 2011-11-17 ENCOUNTER — Encounter: Payer: Self-pay | Admitting: Internal Medicine

## 2011-11-17 ENCOUNTER — Ambulatory Visit (INDEPENDENT_AMBULATORY_CARE_PROVIDER_SITE_OTHER): Payer: Medicare Other | Admitting: Internal Medicine

## 2011-11-17 VITALS — BP 110/74 | HR 80 | Temp 98.2°F | Ht 61.0 in | Wt 134.4 lb

## 2011-11-17 DIAGNOSIS — F419 Anxiety disorder, unspecified: Secondary | ICD-10-CM

## 2011-11-17 DIAGNOSIS — R300 Dysuria: Secondary | ICD-10-CM | POA: Insufficient documentation

## 2011-11-17 DIAGNOSIS — F411 Generalized anxiety disorder: Secondary | ICD-10-CM

## 2011-11-17 HISTORY — DX: Anxiety disorder, unspecified: F41.9

## 2011-11-17 LAB — POCT URINALYSIS DIPSTICK
Bilirubin, UA: NEGATIVE
Glucose, UA: NEGATIVE
Ketones, UA: NEGATIVE
Nitrite, UA: NEGATIVE
Protein, UA: NEGATIVE
Spec Grav, UA: 1.01
Urobilinogen, UA: 0.2
pH, UA: 7

## 2011-11-17 MED ORDER — ALPRAZOLAM 1 MG PO TABS: 1.0000 mg | ORAL_TABLET | Freq: Two times a day (BID) | ORAL | Status: DC

## 2011-11-17 MED ORDER — CIPROFLOXACIN HCL 500 MG PO TABS: 500.0000 mg | ORAL_TABLET | Freq: Two times a day (BID) | ORAL | Status: AC

## 2011-11-17 NOTE — Patient Instructions (Signed)
Take all new medications as prescribed  - sent to your pharmacy Continue all other medications as before, including the refill of the alprazolam today Your specimen will be sent for urine culture If results indicate need for change, you will notified by phone.

## 2011-11-18 ENCOUNTER — Encounter: Payer: Self-pay | Admitting: Internal Medicine

## 2011-11-18 NOTE — Assessment & Plan Note (Signed)
C/w acute cystitis, for cipro course,  Urine studies,  to f/u any worsening symptoms or concerns

## 2011-11-18 NOTE — Assessment & Plan Note (Signed)
O/w stable overall by hx and exam, med refill given

## 2011-11-18 NOTE — Progress Notes (Signed)
  Subjective:    Patient ID: Diana Alvarado, female    DOB: 12-Jun-1956, 56 y.o.   MRN: [id number]  HPI  Here with 1 days onset midl to mod dysuria and frequency, urgency, and low mid abd pain, but no hematuria, high fever, chills , n/v, flank pain.  Has had mult UTI in the past, none recent.  Overall good compliance with treatment, and good medicine tolerability.  Needs xanax refill.  Past Medical History  Diagnosis Date  . Asthma 04/19/2011  . Depression 04/19/2011  . DJD (degenerative joint disease), cervical 04/19/2011  . UTI (lower urinary tract infection)   . Lumbar disc disease 04/19/2011  . Chronic LBP 04/19/2011  . Anxiety 11/17/2011   Past Surgical History  Procedure Date  . Gall bladder 2005  . Abdominal hysterectomy 1997  . Ruptured disc 1992    repaired ruptured disc, neck    reports that she has been smoking.  She does not have any smokeless tobacco history on file. She reports that she does not drink alcohol or use illicit drugs. family history includes Arthritis in her other; Cancer in her other; and Diabetes in her other. Allergies  Allergen Reactions  . Aspirin     REACTION: hives  . Sulfonamide Derivatives     REACTION: hives   Current Outpatient Prescriptions on File Prior to Visit  Medication Sig Dispense Refill  . amphetamine-dextroamphetamine (ADDERALL) 30 MG tablet Take 1 tablet (30 mg total) by mouth 2 (two) times daily. To fill Dec 03, 2011  60 tablet  0  . ARIPiprazole (ABILIFY) 10 MG tablet Take 1 tablet (10 mg total) by mouth daily.  90 tablet  3  . DULoxetine (CYMBALTA) 60 MG capsule Take 1 capsule (60 mg total) by mouth daily.  90 capsule  3  . fentaNYL (DURAGESIC) 100 MCG/HR Use asd 1 patch every 2 days  5 patch  0  . oxyCODONE-acetaminophen (PERCOCET) 10-325 MG per tablet Take 1 tablet by mouth every 6 (six) hours as needed for pain.  20 tablet  0  . traZODone (DESYREL) 100 MG tablet 2 by mouth at bedtime  180 tablet  1   Review of Systems All otherwise  neg per pt    Objective:   Physical Exam BP 110/74  Pulse 80  Temp(Src) 98.2 F (36.8 C) (Oral)  Ht 5\' 1"  (1.549 m)  Wt 134 lb 6 oz (60.952 kg)  BMI 25.39 kg/m2  SpO2 94% Physical Exam  VS noted, mild ill Constitutional: Pt appears well-developed and well-nourished.  HENT: Head: Normocephalic.  Right Ear: External ear normal.  Left Ear: External ear normal.  Eyes: Conjunctivae and EOM are normal. Pupils are equal, round, and reactive to light.  Neck: Normal range of motion. Neck supple.  Cardiovascular: Normal rate and regular rhythm.   Pulmonary/Chest: Effort normal and breath sounds normal.  Abd:  Soft, NT, non-distended, + BS except mild low mid abd tender without guarding or rebound, no flank tender Psychiatric: Pt behavior is normal. Thought content normal. 1+nervous       Assessment & Plan:

## 2011-11-19 LAB — URINE CULTURE: Colony Count: 4000

## 2011-12-25 ENCOUNTER — Telehealth: Payer: Self-pay | Admitting: *Deleted

## 2011-12-25 MED ORDER — AMPHETAMINE-DEXTROAMPHETAMINE 30 MG PO TABS: 30.0000 mg | ORAL_TABLET | Freq: Two times a day (BID) | ORAL | Status: DC

## 2011-12-25 NOTE — Telephone Encounter (Signed)
Done hardcopy to robin  

## 2011-12-25 NOTE — Telephone Encounter (Signed)
Pt left msg on vm requesting refill on adderral prescription for the next 3 months...12/25/11@12 :20pm/LMB

## 2011-12-25 NOTE — Telephone Encounter (Signed)
Called left detailed message that prescriptions requested are ready for pickup at the front desk. 

## 2012-01-07 ENCOUNTER — Other Ambulatory Visit: Payer: Self-pay | Admitting: Internal Medicine

## 2012-01-08 NOTE — Telephone Encounter (Signed)
Done per emr 

## 2012-01-23 ENCOUNTER — Telehealth: Payer: Self-pay | Admitting: Internal Medicine

## 2012-01-23 DIAGNOSIS — M519 Unspecified thoracic, thoracolumbar and lumbosacral intervertebral disc disorder: Secondary | ICD-10-CM

## 2012-01-23 DIAGNOSIS — M47812 Spondylosis without myelopathy or radiculopathy, cervical region: Secondary | ICD-10-CM

## 2012-01-23 DIAGNOSIS — G8929 Other chronic pain: Secondary | ICD-10-CM

## 2012-01-23 DIAGNOSIS — M545 Low back pain, unspecified: Secondary | ICD-10-CM

## 2012-01-23 NOTE — Telephone Encounter (Signed)
The pt called and is hoping to get a referral to a pain clinic.     [phone]

## 2012-01-24 NOTE — Telephone Encounter (Signed)
Refer done.

## 2012-02-01 ENCOUNTER — Encounter: Payer: Self-pay | Admitting: Physical Medicine & Rehabilitation

## 2012-02-06 ENCOUNTER — Encounter: Payer: Medicare Other | Attending: Physical Medicine & Rehabilitation

## 2012-02-06 ENCOUNTER — Encounter: Payer: Self-pay | Admitting: Physical Medicine & Rehabilitation

## 2012-02-06 ENCOUNTER — Ambulatory Visit (HOSPITAL_BASED_OUTPATIENT_CLINIC_OR_DEPARTMENT_OTHER): Payer: Medicare Other | Admitting: Physical Medicine & Rehabilitation

## 2012-02-06 VITALS — BP 123/79 | HR 91 | Ht 61.0 in | Wt 134.0 lb

## 2012-02-06 DIAGNOSIS — M545 Low back pain, unspecified: Secondary | ICD-10-CM | POA: Insufficient documentation

## 2012-02-06 DIAGNOSIS — G8929 Other chronic pain: Secondary | ICD-10-CM

## 2012-02-06 DIAGNOSIS — M961 Postlaminectomy syndrome, not elsewhere classified: Secondary | ICD-10-CM | POA: Insufficient documentation

## 2012-02-06 DIAGNOSIS — M549 Dorsalgia, unspecified: Secondary | ICD-10-CM

## 2012-02-06 DIAGNOSIS — R52 Pain, unspecified: Secondary | ICD-10-CM

## 2012-02-06 DIAGNOSIS — M79609 Pain in unspecified limb: Secondary | ICD-10-CM | POA: Insufficient documentation

## 2012-02-06 MED ORDER — GABAPENTIN 100 MG PO CAPS: 100.0000 mg | ORAL_CAPSULE | Freq: Three times a day (TID) | ORAL | Status: DC

## 2012-02-06 NOTE — Progress Notes (Addendum)
Subjective:    Patient ID: Diana Alvarado, female    DOB: December 21, 1955, 56 y.o.   MRN: [id number]  HPI Several year history of low back pain radiating into the left lower extremity down to the foot. No history of trauma. Symptoms are worsening with time. Last imaging study of the lumbar area was 9 years ago. Has seen an orthopedist for her back up until one month ago. Patient has had epidurals in the past. She has been maintained on high-dose narcotic analgesics on a chronic basis. Her dose has been reduced more recently to fentanyl 25 mcg. Her pain seemed to be worsened when she went from the 75 mcg patch to the 50 mcg patch. Also taking oxycodone 10 mg dose 3-4 times per day but is currently out of them for the last week. On disability for neck and back problems Pain Inventory Average Pain 10 Pain Right Now 9 My pain is sharp and stabbing  In the last 24 hours, has pain interfered with the following? General activity 10 Relation with others 10 Enjoyment of life 10 What TIME of day is your pain at its worst? evening Sleep (in general) Fair  Pain is worse with: walking, sitting and some activites Pain improves with: medication Relief from Meds: 7  Mobility use a cane how many minutes can you walk? ability to climb steps?  no do you drive?  yes Do you have any goals in this area?  yes  Function disabled: date disabled  I need assistance with the following:  household duties Do you have any goals in this area?  no  Neuro/Psych tingling depression anxiety  Prior Studies Any changes since last visit?  no  Physicians involved in your care Any changes since last visit?  no   Family History  Problem Relation Age of Onset  . Arthritis Other   . Cancer Other     cancer  . Diabetes Other    History   Social History  . Marital Status: Divorced    Spouse Name: N/A    Number of Children: N/A  . Years of Education: 16   Occupational History  . RN    Social History  Main Topics  . Smoking status: Current Everyday Smoker  . Smokeless tobacco: None  . Alcohol Use: No  . Drug Use: No  . Sexually Active: None   Other Topics Concern  . None   Social History Narrative  . None   Past Surgical History  Procedure Date  . Gall bladder 2005  . Abdominal hysterectomy 1997  . Ruptured disc 1992    repaired ruptured disc, neck   Past Medical History  Diagnosis Date  . Asthma 04/19/2011  . Depression 04/19/2011  . DJD (degenerative joint disease), cervical 04/19/2011  . UTI (lower urinary tract infection)   . Lumbar disc disease 04/19/2011  . Chronic LBP 04/19/2011  . Anxiety 11/17/2011   BP 123/79  Pulse 91  Ht 5\' 1"  (1.549 m)  Wt 134 lb (60.782 kg)  BMI 25.32 kg/m2  SpO2 99%     Review of Systems  Psychiatric/Behavioral: Positive for dysphoric mood.  All other systems reviewed and are negative.       Objective:   Physical Exam  Constitutional: She is oriented to person, place, and time. She appears well-developed and well-nourished.  HENT:  Head: Normocephalic and atraumatic.  Eyes: Conjunctivae and EOM are normal. Pupils are equal, round, and reactive to light.  Musculoskeletal:  Cervical back: She exhibits decreased range of motion and tenderness. She exhibits no deformity and no spasm.       Lumbar back: She exhibits decreased range of motion and tenderness. She exhibits no swelling, no deformity and no spasm.       Tenderness at the lumbosacral junction paraspinal muscles-mild Right trapezius tenderness mild to moderate  Neurological: She is alert and oriented to person, place, and time. She displays no atrophy. No sensory deficit. She exhibits normal muscle tone. Coordination and gait normal.  Reflex Scores:      Tricep reflexes are 2+ on the right side and 2+ on the left side.      Bicep reflexes are 2+ on the right side and 2+ on the left side.      Brachioradialis reflexes are 2+ on the right side and 2+ on the left  side.      Patellar reflexes are 2+ on the right side and 2+ on the left side.      Achilles reflexes are 2+ on the right side and 2+ on the left side.      -SLR  Psychiatric: She has a normal mood and affect.          Assessment & Plan:  1. Chronic low back pain. Some radiation to the left lower extremity. No other physical exam findings consistent with radiculopathy. She has not had any physical therapy in the past. We'll make a referral. We'll check x-rays of the lumbar spine. If no improvement in one month would get MRI.  Urine drug screen showed methadone, hydrocodone which she did not report She is not a good narcotic analgesic candidate Recommend [facility] We can treat back pain without narcotics after she finishes their program

## 2012-02-06 NOTE — Patient Instructions (Signed)
You'll need to get an x-ray at [facility] imaging I have referred you to physical therapy at: [facility] Please call and about one week's time. By that time we should have the x-rays as well as the urine drug screen results. We discussed the policies of this clinic and the necessary steps we need to document the reason for pain medicine usage

## 2012-02-07 ENCOUNTER — Other Ambulatory Visit: Payer: Self-pay

## 2012-02-07 NOTE — Telephone Encounter (Signed)
Too soon, had total 4 mo rx on mar 15

## 2012-02-08 ENCOUNTER — Other Ambulatory Visit: Payer: Self-pay | Admitting: Family Medicine

## 2012-02-08 DIAGNOSIS — Z1231 Encounter for screening mammogram for malignant neoplasm of breast: Secondary | ICD-10-CM

## 2012-02-12 ENCOUNTER — Telehealth: Payer: Self-pay

## 2012-02-12 NOTE — Telephone Encounter (Signed)
Pt aware that her UDS was inconsistent. Positive for Methadone, she states that she may have taken on of her son's Methadone tablets. She is aware that Dr. Wynn Banker will have to look these results over and we will get back with her.

## 2012-02-12 NOTE — Telephone Encounter (Signed)
Pt would like to know uds results so she can start getting medications.

## 2012-02-13 ENCOUNTER — Telehealth: Payer: Self-pay | Admitting: *Deleted

## 2012-02-13 NOTE — Telephone Encounter (Signed)
Pt aware and states that she wants Dr. Wynn Banker to know that she hasn't been using Methadone. Her son has it in a liquid form and he put it in a gatorade and she drank half of it.

## 2012-02-13 NOTE — Telephone Encounter (Signed)
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      I will not be prescribing narcotic analgesics for this patient.      I would recommend detox[facility] at [facility].      If she completes the [facility] program I can see her for nonnarcotic back pain management

## 2012-02-13 NOTE — Telephone Encounter (Signed)
LM for pt to call back.

## 2012-02-19 ENCOUNTER — Encounter: Payer: Medicare Other | Attending: Physical Medicine & Rehabilitation | Admitting: Physical Medicine and Rehabilitation

## 2012-02-22 ENCOUNTER — Other Ambulatory Visit: Payer: Self-pay

## 2012-02-22 NOTE — Telephone Encounter (Signed)
Next refill not due until July 13 by my count

## 2012-02-24 ENCOUNTER — Other Ambulatory Visit: Payer: Self-pay | Admitting: Internal Medicine

## 2012-02-26 ENCOUNTER — Other Ambulatory Visit: Payer: Self-pay | Admitting: Internal Medicine

## 2012-02-26 NOTE — Telephone Encounter (Signed)
Faxed hardcopy to pharmacy. 

## 2012-02-26 NOTE — Telephone Encounter (Signed)
Done hardcopy to robin  

## 2012-02-28 ENCOUNTER — Ambulatory Visit: Payer: Medicare Other

## 2012-03-05 ENCOUNTER — Ambulatory Visit: Payer: Medicare Other

## 2012-03-08 ENCOUNTER — Ambulatory Visit: Payer: Medicare Other

## 2012-03-15 ENCOUNTER — Other Ambulatory Visit: Payer: Self-pay

## 2012-03-15 MED ORDER — AMPHETAMINE-DEXTROAMPHETAMINE 30 MG PO TABS: 30.0000 mg | ORAL_TABLET | Freq: Two times a day (BID) | ORAL | Status: DC

## 2012-03-15 NOTE — Telephone Encounter (Signed)
Called the patient left detailed message that prescription requested is ready for pickup at the front desk. 

## 2012-03-15 NOTE — Telephone Encounter (Signed)
Done hardcopy to robin  

## 2012-03-25 ENCOUNTER — Encounter: Payer: Self-pay | Admitting: Internal Medicine

## 2012-03-27 ENCOUNTER — Telehealth: Payer: Self-pay

## 2012-03-27 MED ORDER — VARENICLINE TARTRATE 1 MG PO TABS: 1.0000 mg | ORAL_TABLET | Freq: Two times a day (BID) | ORAL | Status: AC

## 2012-03-27 MED ORDER — VARENICLINE TARTRATE 0.5 MG X 11 & 1 MG X 42 PO MISC: ORAL | Status: DC

## 2012-03-27 MED ORDER — VARENICLINE TARTRATE 0.5 MG X 11 & 1 MG X 42 PO MISC: ORAL | Status: AC

## 2012-03-27 NOTE — Telephone Encounter (Signed)
[facility] 

## 2012-03-27 NOTE — Telephone Encounter (Signed)
Pt called requesting Rx for chantix to assist in smoking cessation, please advise.   [facility]

## 2012-03-27 NOTE — Telephone Encounter (Signed)
Patient informed. 

## 2012-03-27 NOTE — Telephone Encounter (Signed)
Faxed hardcopy to pharmacy. 

## 2012-04-15 ENCOUNTER — Ambulatory Visit: Payer: Medicare Other

## 2012-04-19 ENCOUNTER — Other Ambulatory Visit (INDEPENDENT_AMBULATORY_CARE_PROVIDER_SITE_OTHER): Payer: Medicare Other

## 2012-04-19 ENCOUNTER — Encounter: Payer: Self-pay | Admitting: Internal Medicine

## 2012-04-19 ENCOUNTER — Ambulatory Visit (INDEPENDENT_AMBULATORY_CARE_PROVIDER_SITE_OTHER): Payer: Medicare Other | Admitting: Internal Medicine

## 2012-04-19 VITALS — BP 122/78 | HR 92 | Temp 97.2°F | Ht 61.0 in | Wt 132.2 lb

## 2012-04-19 DIAGNOSIS — Z79899 Other long term (current) drug therapy: Secondary | ICD-10-CM

## 2012-04-19 DIAGNOSIS — M545 Low back pain, unspecified: Secondary | ICD-10-CM

## 2012-04-19 DIAGNOSIS — Z0000 Encounter for general adult medical examination without abnormal findings: Secondary | ICD-10-CM

## 2012-04-19 DIAGNOSIS — Z13220 Encounter for screening for lipoid disorders: Secondary | ICD-10-CM

## 2012-04-19 DIAGNOSIS — G8929 Other chronic pain: Secondary | ICD-10-CM

## 2012-04-19 LAB — CBC WITH DIFFERENTIAL/PLATELET
Basophils Absolute: 0 10*3/uL (ref 0.0–0.1)
Basophils Relative: 0.4 % (ref 0.0–3.0)
Eosinophils Absolute: 0.1 10*3/uL (ref 0.0–0.7)
Eosinophils Relative: 2.5 % (ref 0.0–5.0)
HCT: 40.1 % (ref 36.0–46.0)
Hemoglobin: 13.4 g/dL (ref 12.0–15.0)
Lymphocytes Relative: 33.9 % (ref 12.0–46.0)
Lymphs Abs: 2 10*3/uL (ref 0.7–4.0)
MCHC: 33.4 g/dL (ref 30.0–36.0)
MCV: 95.4 fl (ref 78.0–100.0)
Monocytes Absolute: 0.3 10*3/uL (ref 0.1–1.0)
Monocytes Relative: 5.5 % (ref 3.0–12.0)
Neutro Abs: 3.5 10*3/uL (ref 1.4–7.7)
Neutrophils Relative %: 57.7 % (ref 43.0–77.0)
Platelets: 170 10*3/uL (ref 150.0–400.0)
RBC: 4.21 Mil/uL (ref 3.87–5.11)
RDW: 13.2 % (ref 11.5–14.6)
WBC: 6 10*3/uL (ref 4.5–10.5)

## 2012-04-19 LAB — URINALYSIS, ROUTINE W REFLEX MICROSCOPIC
Bilirubin Urine: NEGATIVE
Hgb urine dipstick: NEGATIVE
Ketones, ur: NEGATIVE
Leukocytes, UA: NEGATIVE
Nitrite: NEGATIVE
Specific Gravity, Urine: >=1.03 (ref 1.000–1.030)
Total Protein, Urine: NEGATIVE
Urine Glucose: NEGATIVE
Urobilinogen, UA: 0.2 (ref 0.0–1.0)
pH: 5.5 (ref 5.0–8.0)

## 2012-04-19 LAB — BASIC METABOLIC PANEL
BUN: 15 mg/dL (ref 6–23)
CO2: 28 mEq/L (ref 19–32)
Calcium: 9.3 mg/dL (ref 8.4–10.5)
Chloride: 103 mEq/L (ref 96–112)
Creatinine, Ser: 0.8 mg/dL (ref 0.4–1.2)
GFR: 76.99 mL/min (ref >60.00)
Glucose, Bld: 89 mg/dL (ref 70–99)
Potassium: 4.3 mEq/L (ref 3.5–5.1)
Sodium: 139 mEq/L (ref 135–145)

## 2012-04-19 LAB — LIPID PANEL
Cholesterol: 205 mg/dL — ABNORMAL HIGH (ref 0–200)
HDL: 69.7 mg/dL (ref >39.00)
Total CHOL/HDL Ratio: 3
Triglycerides: 73 mg/dL (ref 0.0–149.0)
VLDL: 14.6 mg/dL (ref 0.0–40.0)

## 2012-04-19 LAB — HEPATIC FUNCTION PANEL
ALT: 15 U/L (ref 0–35)
AST: 19 U/L (ref 0–37)
Albumin: 4.4 g/dL (ref 3.5–5.2)
Alkaline Phosphatase: 69 U/L (ref 39–117)
Bilirubin, Direct: 0.1 mg/dL (ref 0.0–0.3)
Total Bilirubin: 0.5 mg/dL (ref 0.3–1.2)
Total Protein: 7.2 g/dL (ref 6.0–8.3)

## 2012-04-19 LAB — TSH: TSH: 1.12 u[IU]/mL (ref 0.35–5.50)

## 2012-04-19 MED ORDER — GABAPENTIN 100 MG PO CAPS: 100.0000 mg | ORAL_CAPSULE | Freq: Three times a day (TID) | ORAL | Status: DC

## 2012-04-19 MED ORDER — ARIPIPRAZOLE 10 MG PO TABS: 10.0000 mg | ORAL_TABLET | Freq: Every day | ORAL | Status: DC

## 2012-04-19 MED ORDER — FENTANYL 100 MCG/HR TD PT72: MEDICATED_PATCH | TRANSDERMAL | Status: DC

## 2012-04-19 MED ORDER — AMPHETAMINE-DEXTROAMPHETAMINE 30 MG PO TABS: 30.0000 mg | ORAL_TABLET | Freq: Two times a day (BID) | ORAL | Status: DC

## 2012-04-19 MED ORDER — TRAZODONE HCL 100 MG PO TABS: ORAL_TABLET | ORAL | Status: DC

## 2012-04-19 MED ORDER — DULOXETINE HCL 60 MG PO CPEP: 60.0000 mg | ORAL_CAPSULE | Freq: Every day | ORAL | Status: DC

## 2012-04-19 MED ORDER — OXYCODONE-ACETAMINOPHEN 10-325 MG PO TABS
1.0000 | ORAL_TABLET | Freq: Four times a day (QID) | ORAL | Status: DC | PRN
Start: 2012-04-19 — End: 2012-05-15

## 2012-04-19 MED ORDER — ALPRAZOLAM 1 MG PO TABS: 1.0000 mg | ORAL_TABLET | Freq: Two times a day (BID) | ORAL | Status: DC | PRN

## 2012-04-19 NOTE — Progress Notes (Signed)
Subjective:    Patient ID: Diana Alvarado, female    DOB: 14-Oct-1955, 56 y.o.   MRN: [id number]  HPI  Here for wellness and f/u;  Overall doing ok;  Pt denies CP, worsening SOB, DOE, wheezing, orthopnea, PND, worsening LE edema, palpitations, dizziness or syncope.  Pt denies neurological change such as new Headache, facial or extremity weakness.  Pt denies polydipsia, polyuria, or low sugar symptoms. Pt states overall good compliance with treatment and medications, good tolerability, and trying to follow lower cholesterol diet.  Pt denies worsening depressive symptoms, suicidal ideation or panic. No fever, wt loss, night sweats, loss of appetite, or other constitutional symptoms.  Pt states good ability with ADL's, low fall risk, home safety reviewed and adequate, no significant changes in hearing or vision, and occasionally active with exercise.  Pt continues to have recurring LBP without change in severity, bowel or bladder change, fever, wt loss,  worsening LE pain/numbness/weakness, gait change or falls.; did see orthopedic/dr Beane who rec'd  surgury (lumbar fusion) but still considering for LLE radicular pain persistent.   No longer sees Dr Ethelene Hal for pain, has colonscopy and mammogram sched;d in the very near future. Currently on the chantix - trying to quit smoking. Disable RN due to spine dz Past Medical History  Diagnosis Date  . Asthma 04/19/2011  . Depression 04/19/2011  . DJD (degenerative joint disease), cervical 04/19/2011  . UTI (lower urinary tract infection)   . Lumbar disc disease 04/19/2011  . Chronic LBP 04/19/2011  . Anxiety 11/17/2011   Past Surgical History  Procedure Date  . Gall bladder 2005  . Abdominal hysterectomy 1997  . Ruptured disc 1992    repaired ruptured disc, neck    reports that she has been smoking.  She does not have any smokeless tobacco history on file. She reports that she does not drink alcohol or use illicit drugs. family history includes Arthritis in her  other; Cancer in her other; and Diabetes in her other. Allergies  Allergen Reactions  . Aspirin     REACTION: hives  . Sulfonamide Derivatives     REACTION: hives   Current Outpatient Prescriptions on File Prior to Visit  Medication Sig Dispense Refill  . ALPRAZolam (XANAX) 1 MG tablet Take 1 tablet (1 mg total) by mouth 2 (two) times daily as needed for sleep.  60 tablet  1  . amphetamine-dextroamphetamine (ADDERALL) 30 MG tablet Take 1 tablet (30 mg total) by mouth 2 (two) times daily. To fil March 31, 2012  60 tablet  0  . ARIPiprazole (ABILIFY) 10 MG tablet Take 1 tablet (10 mg total) by mouth daily.  90 tablet  3  . DULoxetine (CYMBALTA) 60 MG capsule Take 1 capsule (60 mg total) by mouth daily.  90 capsule  3  . fentaNYL (DURAGESIC) 100 MCG/HR Use asd 1 patch every 2 days  5 patch  0  . gabapentin (NEURONTIN) 100 MG capsule Take 1 capsule (100 mg total) by mouth 3 (three) times daily.  90 capsule  1  . traZODone (DESYREL) 100 MG tablet TAKE 2 TABLETS BY MOUTH EVERY NIGHT AT BEDTIME  180 tablet  1  . varenicline (CHANTIX CONTINUING MONTH PAK) 1 MG tablet Take 1 tablet (1 mg total) by mouth 2 (two) times daily.  60 tablet  1  . varenicline (CHANTIX STARTING MONTH PAK) 0.5 MG X 11 & 1 MG X 42 tablet Take one 0.5 mg tablet by mouth once daily for 3 days, then increase to  one 0.5 mg tablet twice daily for 4 days, then increase to one 1 mg tablet twice daily.  53 tablet  0  . oxyCODONE-acetaminophen (PERCOCET) 10-325 MG per tablet Take 1 tablet by mouth every 6 (six) hours as needed for pain.  20 tablet  0   Review of Systems Review of Systems  Constitutional: Negative for diaphoresis, activity change, appetite change and unexpected weight change.  HENT: Negative for hearing loss, ear pain, facial swelling, mouth sores and neck stiffness.   Eyes: Negative for pain, redness and visual disturbance.  Respiratory: Negative for shortness of breath and wheezing.   Cardiovascular: Negative for  chest pain and palpitations.  Gastrointestinal: Negative for diarrhea, blood in stool, abdominal distention and rectal pain.  Genitourinary: Negative for hematuria, flank pain and decreased urine volume.  Musculoskeletal: Negative for myalgias and joint swelling. 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  Neurological: Negative for syncope and numbness.  Hematological: Negative for adenopathy.  Psychiatric/Behavioral: Negative for hallucinations, self-injury, decreased concentration and agitation.      Objective:   Physical Exam BP 122/78  Pulse 92  Temp 97.2 F (36.2 C) (Oral)  Ht 5\' 1"  (1.549 m)  Wt 132 lb 4 oz (59.988 kg)  BMI 24.99 kg/m2  SpO2 98% Physical Exam  VS noted Constitutional: Pt is oriented to person, place, and time. Appears well-developed and well-nourished.  HENT:  Head: Normocephalic and atraumatic.  Right Ear: External ear normal.  Left Ear: External ear normal.  Nose: Nose normal.  Mouth/Throat: Oropharynx is clear and moist.  Eyes: Conjunctivae and EOM are normal. Pupils are equal, round, and reactive to light.  Neck: Normal range of motion. Neck supple. No JVD present. No tracheal deviation present.  Cardiovascular: Normal rate, regular rhythm, normal heart sounds and intact distal pulses.   Pulmonary/Chest: Effort normal and breath sounds normal.  Abdominal: Soft. Bowel sounds are normal. There is no tenderness.  Musculoskeletal: Normal range of motion. Exhibits no edema. 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  Neurological: Pt is alert and oriented to person, place, and time. Pt has normal reflexes. No cranial nerve deficit. Motor/gait intact Skin: Skin is warm and  dry. No rash noted.  Psychiatric:  Has  normal mood and affect. Behavior is normal.     Assessment & Plan:

## 2012-04-19 NOTE — Assessment & Plan Note (Signed)

## 2012-04-19 NOTE — Patient Instructions (Addendum)
Continue all other medications as before You are given the refills as requested Please keep your appointments with your specialists as you have planned - Dr Shelle Iron, as well as the mammogram and colonoscopy Please go to LAB in the Basement for the blood and/or urine tests to be done today You will be contacted by phone if any changes need to be made immediately.  Otherwise, you will receive a letter about your results with an explanation. Please stop smoking as you are doing Please return in 1 year for your yearly visit, or sooner if needed, with Lab testing done 3-5 days before

## 2012-04-20 ENCOUNTER — Encounter: Payer: Self-pay | Admitting: Internal Medicine

## 2012-04-20 ENCOUNTER — Other Ambulatory Visit: Payer: Self-pay | Admitting: Physical Medicine & Rehabilitation

## 2012-04-20 NOTE — Assessment & Plan Note (Signed)
stable overall by hx and exam, most recent data reviewed with pt, and pt to continue medical treatment as before, for pain med refills

## 2012-04-22 ENCOUNTER — Encounter: Payer: Self-pay | Admitting: Internal Medicine

## 2012-04-22 LAB — LDL CHOLESTEROL, DIRECT: Direct LDL: 118.8 mg/dL

## 2012-04-24 ENCOUNTER — Telehealth: Payer: Self-pay | Admitting: Internal Medicine

## 2012-04-24 ENCOUNTER — Ambulatory Visit: Payer: Medicare Other

## 2012-04-24 MED ORDER — ZOLPIDEM TARTRATE 10 MG PO TABS: 10.0000 mg | ORAL_TABLET | Freq: Every evening | ORAL | Status: DC | PRN

## 2012-04-24 NOTE — Telephone Encounter (Signed)
Done hardcopy to robin  

## 2012-04-24 NOTE — Telephone Encounter (Signed)
Called the patient faxed hardcopy to [facility] per pt. Request.

## 2012-04-24 NOTE — Telephone Encounter (Signed)
Caller: [name]/Patient; Patient Name: Diana Alvarado; PCP: Oliver Barre; Best Callback Phone Number: [phone].  Forgot to ask MD for RX for Ambien 10 mg  at office visit 04/19/12; RX was formerly written by Dr Ethelene Hal. [facility]   Information noted and sent to [facility] pool for follow up  for older adult with question about prescribed medication per Medication Question Call guideline. Please call cell [phone] if any problem with new RX otherwise caller will follow up with pharmacy within 24 hours.

## 2012-05-01 ENCOUNTER — Other Ambulatory Visit: Payer: Self-pay | Admitting: Internal Medicine

## 2012-05-01 ENCOUNTER — Ambulatory Visit
Admission: RE | Admit: 2012-05-01 | Discharge: 2012-05-01 | Disposition: A | Discharge status: Home / Self Care | Payer: Medicare Other | Source: Ambulatory Visit | Attending: Family Medicine | Admitting: Family Medicine

## 2012-05-01 DIAGNOSIS — Z1231 Encounter for screening mammogram for malignant neoplasm of breast: Secondary | ICD-10-CM

## 2012-05-14 ENCOUNTER — Encounter: Payer: Medicare Other | Admitting: Internal Medicine

## 2012-05-14 ENCOUNTER — Telehealth: Payer: Self-pay

## 2012-05-14 ENCOUNTER — Telehealth: Payer: Self-pay | Admitting: *Deleted

## 2012-05-14 NOTE — Telephone Encounter (Signed)
To robin to clarify question

## 2012-05-14 NOTE — Telephone Encounter (Signed)
Pt RSC previsit til 05/21/12.

## 2012-05-14 NOTE — Telephone Encounter (Signed)
Pt called requesting a call back regarding question on last lab work

## 2012-05-15 ENCOUNTER — Telehealth: Payer: Self-pay

## 2012-05-15 MED ORDER — OXYCODONE-ACETAMINOPHEN 10-325 MG PO TABS: 1.0000 | ORAL_TABLET | Freq: Four times a day (QID) | ORAL | Status: DC | PRN

## 2012-05-15 MED ORDER — BENZONATATE 100 MG PO CAPS: ORAL_CAPSULE | ORAL | Status: DC

## 2012-05-15 NOTE — Telephone Encounter (Signed)
Called the patient and she wanted to know what her cholesterol results were.  Informed of those results and that letter had been mailed.  The patient had not been able to check her PO Box as has taking care of her sister.  Patient has been informed of results.

## 2012-05-15 NOTE — Telephone Encounter (Signed)
Patient informed rx sent in for cough and MD instructions.  Also that hardcopy is ready for pickup at the front desk.

## 2012-05-15 NOTE — Telephone Encounter (Signed)
Hard to know why she has the cough;  Ok for [employer], would need OV if not improved or persists  Percocet - Done hardcopy to [employer]

## 2012-05-15 NOTE — Telephone Encounter (Signed)
The patient called to inform she has had a cough, no fever for 3 days and would like something called ([facility] in.  Also requesting to pickup her percocet prescription on Friday.  Please advise call back number is [phone]

## 2012-05-21 ENCOUNTER — Ambulatory Visit (AMBULATORY_SURGERY_CENTER): Payer: Medicare Other | Admitting: *Deleted

## 2012-05-21 ENCOUNTER — Encounter: Payer: Self-pay | Admitting: Internal Medicine

## 2012-05-21 VITALS — Ht 61.0 in | Wt 131.6 lb

## 2012-05-21 DIAGNOSIS — Z1211 Encounter for screening for malignant neoplasm of colon: Secondary | ICD-10-CM

## 2012-05-21 MED ORDER — MOVIPREP 100 G PO SOLR: 1.0000 | Freq: Once | ORAL | Status: DC

## 2012-05-22 ENCOUNTER — Telehealth: Payer: Self-pay

## 2012-05-22 NOTE — Telephone Encounter (Signed)
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

## 2012-05-22 NOTE — Telephone Encounter (Signed)
[facility] in [place] called to inform the patient brought a rx for percocet today to be filled written by Dr. Jonny Ruiz.They returned the hardcopy to the patient and did not fill. They  Wanted to inform MD that the patient was given #120 percocet on 05/15/12 for [facility] written by another MD.  Also she had filled at [facility] in [place] percocet on 04/24/12 by Dr. Jonny Ruiz.  Call back number for [facility] is [phone] Leotis Shames)

## 2012-05-23 NOTE — Telephone Encounter (Signed)
D/C letter completed by MD given to [place] to mail.

## 2012-05-24 ENCOUNTER — Telehealth: Payer: Self-pay | Admitting: Internal Medicine

## 2012-05-24 NOTE — Telephone Encounter (Signed)
Dismissal Letter sent by Certified Mail 05/24/2012  Received the Return Receipt showing the patient has picked up Dismissal Letter 06/04/2012

## 2012-05-27 ENCOUNTER — Telehealth: Payer: Self-pay | Admitting: Internal Medicine

## 2012-05-27 ENCOUNTER — Telehealth: Payer: Self-pay | Admitting: *Deleted

## 2012-05-27 NOTE — Telephone Encounter (Signed)
Spoke with patient.  Explained to her that this should not interfere with her procedure tomorrow.  She will proceed with prep for procedure tomorrow.

## 2012-05-27 NOTE — Telephone Encounter (Signed)
Returned pts call.  She states that she has a dry cough and wanted to make sure it was still ok to proceed with procedure.  She reports no fever or any other symptoms.  She will call back if anything else develops but will plan to be here for procedure tomorrow.

## 2012-05-28 ENCOUNTER — Encounter: Payer: Self-pay | Admitting: Internal Medicine

## 2012-05-28 ENCOUNTER — Encounter: Payer: Medicare Other | Admitting: Internal Medicine

## 2012-05-28 ENCOUNTER — Ambulatory Visit (AMBULATORY_SURGERY_CENTER): Payer: Medicare Other | Admitting: Internal Medicine

## 2012-05-28 VITALS — BP 119/72 | HR 77 | Temp 96.9°F | Resp 18 | Ht 61.0 in | Wt 131.0 lb

## 2012-05-28 DIAGNOSIS — Z1211 Encounter for screening for malignant neoplasm of colon: Secondary | ICD-10-CM

## 2012-05-28 MED ORDER — SODIUM CHLORIDE 0.9 % IV SOLN: 500.0000 mL | INTRAVENOUS | Status: DC

## 2012-05-28 NOTE — Op Note (Signed)
[facility] [address] [place], [address]   COLONOSCOPY PROCEDURE REPORT  PATIENT: Diana Alvarado, Diana Alvarado  MR#: [id number] BIRTHDATE: 1955-09-11 , 55  yrs. old GENDER: Female ENDOSCOPIST: Roxy Cedar, MD REFERRED XB:[id number] Recall, M.D. PROCEDURE DATE:  05/28/2012 PROCEDURE:   Colonoscopy, screening ASA CLASS:   Class II INDICATIONS:average risk patient for colon cancer.   Prior exam 11-2009 w/ poor prep MEDICATIONS: MAC sedation, administered by CRNA and propofol (Diprivan) 200mg  IV  DESCRIPTION OF PROCEDURE:   After the risks benefits and alternatives of the procedure were thoroughly explained, informed consent was obtained.  A digital rectal exam revealed no abnormalities of the rectum.   The LB [id number]  endoscope was introduced through the anus and advanced to the cecum, which was identified by both the appendix and ileocecal valve. No adverse events experienced.   The quality of the prep was good, using MoviPrep  The instrument was then slowly withdrawn as the colon was fully examined.      COLON FINDINGS: A normal appearing cecum, ileocecal valve, and appendiceal orifice were identified.  The ascending, hepatic flexure, transverse, splenic flexure, descending, sigmoid colon and rectum appeared unremarkable.  No polyps or cancers were seen. Retroflexed views revealed no abnormalities. The time to cecum=2 minutes 57 seconds.  Withdrawal time=10 minutes 0 seconds.  The scope was withdrawn and the procedure completed. COMPLICATIONS: There were no complications.  ENDOSCOPIC IMPRESSION: 1. Normal colon  RECOMMENDATIONS: 1. Continue current colorectal screening recommendations for "routine risk" patients with a repeat colonoscopy in 10 years.   eSigned:  Roxy Cedar, MD 05/28/2012 10:46 AM   cc: Corwin Levins, MD and The Patient

## 2012-05-28 NOTE — Patient Instructions (Addendum)
YOU HAD AN ENDOSCOPIC PROCEDURE TODAY AT THE [facility]: Refer to the procedure report that was given to you for any specific questions about what was found during the examination.  If the procedure report does not answer your questions, please call your gastroenterologist to clarify.  If you requested that your care partner not be given the details of your procedure findings, then the procedure report has been included in a sealed envelope for you to review at your convenience later.  YOU SHOULD EXPECT: Some feelings of bloating in the abdomen. Passage of more gas than usual.  Walking can help get rid of the air that was put into your GI tract during the procedure and reduce the bloating. If you had a lower endoscopy (such as a colonoscopy or flexible sigmoidoscopy) you may notice spotting of blood in your stool or on the toilet paper. If you underwent a bowel prep for your procedure, then you may not have a normal bowel movement for a few days.  DIET: Your first meal following the procedure should be a light meal and then it is ok to progress to your normal diet.  A half-sandwich or bowl of soup is an example of a good first meal.  Heavy or fried foods are harder to digest and may make you feel nauseous or bloated.  Likewise meals heavy in dairy and vegetables can cause extra gas to form and this can also increase the bloating.  Drink plenty of fluids but you should avoid alcoholic beverages for 24 hours.  ACTIVITY: Your care partner should take you home directly after the procedure.  You should plan to take it easy, moving slowly for the rest of the day.  You can resume normal activity the day after the procedure however you should NOT DRIVE or use heavy machinery for 24 hours (because of the sedation medicines used during the test).    SYMPTOMS TO REPORT IMMEDIATELY: A gastroenterologist can be reached at any hour.  During normal business hours, 8:30 AM to 5:00 PM Monday through Friday,  call [phone].  After hours and on weekends, please call the GI answering service at [phone] who will take a message and have the physician on call contact you.   Following lower endoscopy (colonoscopy or flexible sigmoidoscopy):  Excessive amounts of blood in the stool  Significant tenderness or worsening of abdominal pains  Swelling of the abdomen that is new, acute  Fever of 100F or higher  FOLLOW UP: If any biopsies were taken you will be contacted by phone or by letter within the next 1-3 weeks.  Call your gastroenterologist if you have not heard about the biopsies in 3 weeks.  Our staff will call the home number listed on your records the next business day following your procedure to check on you and address any questions or concerns that you may have at that time regarding the information given to you following your procedure. This is a courtesy call and so if there is no answer at the home number and we have not heard from you through the emergency physician on call, we will assume that you have returned to your regular daily activities without incident.  SIGNATURES/CONFIDENTIALITY: You and/or your care partner have signed paperwork which will be entered into your electronic medical record.  These signatures attest to the fact that that the information above on your After Visit Summary has been reviewed and is understood.  Full responsibility of the confidentiality of this   discharge information lies with you and/or your care-partner.   Thank-you for choosing us for your healthcare needs. 

## 2012-05-28 NOTE — Progress Notes (Addendum)
Patient did not have preoperative order for IV antibiotic SSI prophylaxis. (G8918)  Patient did not experience any of the following events: a burn prior to discharge; a fall within the facility; wrong site/side/patient/procedure/implant event; or a hospital transfer or hospital admission upon discharge from the facility. (G8907)  

## 2012-05-29 ENCOUNTER — Telehealth: Payer: Self-pay | Admitting: *Deleted

## 2012-05-29 NOTE — Telephone Encounter (Signed)
  Follow up Call-  Call back number 05/28/2012  Post procedure Call Back phone  # [phone]  Permission to leave phone message Yes     Patient questions:  Do you have a fever, pain , or abdominal swelling? no Pain Score  0 *  Have you tolerated food without any problems? yes  Have you been able to return to your normal activities? yes  Do you have any questions about your discharge instructions: Diet   no Medications  no Follow up visit  no  Do you have questions or concerns about your Care? no  Actions: * If pain score is 4 or above: No action needed, pain <4.

## 2012-06-04 ENCOUNTER — Ambulatory Visit: Payer: Medicare Other | Admitting: Family Medicine

## 2012-06-05 ENCOUNTER — Ambulatory Visit (INDEPENDENT_AMBULATORY_CARE_PROVIDER_SITE_OTHER): Payer: Medicare Other | Admitting: Family Medicine

## 2012-06-05 ENCOUNTER — Encounter: Payer: Self-pay | Admitting: Family Medicine

## 2012-06-05 VITALS — BP 136/83 | HR 99 | Ht 61.0 in | Wt 131.0 lb

## 2012-06-05 DIAGNOSIS — F411 Generalized anxiety disorder: Secondary | ICD-10-CM

## 2012-06-05 DIAGNOSIS — IMO0002 Reserved for concepts with insufficient information to code with codable children: Secondary | ICD-10-CM

## 2012-06-05 DIAGNOSIS — F17210 Nicotine dependence, cigarettes, uncomplicated: Secondary | ICD-10-CM | POA: Insufficient documentation

## 2012-06-05 DIAGNOSIS — M549 Dorsalgia, unspecified: Secondary | ICD-10-CM

## 2012-06-05 DIAGNOSIS — G8929 Other chronic pain: Secondary | ICD-10-CM

## 2012-06-05 DIAGNOSIS — F1920 Other psychoactive substance dependence, uncomplicated: Secondary | ICD-10-CM

## 2012-06-05 DIAGNOSIS — F1120 Opioid dependence, uncomplicated: Secondary | ICD-10-CM

## 2012-06-05 DIAGNOSIS — F1921 Other psychoactive substance dependence, in remission: Secondary | ICD-10-CM | POA: Insufficient documentation

## 2012-06-05 DIAGNOSIS — J40 Bronchitis, not specified as acute or chronic: Secondary | ICD-10-CM

## 2012-06-05 DIAGNOSIS — F419 Anxiety disorder, unspecified: Secondary | ICD-10-CM

## 2012-06-05 MED ORDER — AZITHROMYCIN 250 MG PO TABS: ORAL_TABLET | ORAL | Status: AC

## 2012-06-05 MED ORDER — ALBUTEROL SULFATE HFA 108 (90 BASE) MCG/ACT IN AERS: 2.0000 | INHALATION_SPRAY | Freq: Four times a day (QID) | RESPIRATORY_TRACT | Status: DC | PRN

## 2012-06-05 NOTE — Progress Notes (Signed)
CC: Diana Alvarado is a 56 y.o. female is here for Establish Care and Shortness of Breath   Subjective: HPI:  Patient presents to establish care with acute complaint of cough and requesting multiple medication refills.  She describes a productive cough has been worsening over the 1-2 weeks not associated with shortness of breath with exertion. Cough produces phlegm he does not have blood or any rust discoloration to it. Cough is present 24 hours a day however shortness of breath improves at night. She's a smoker does not believe she has had a condition like this before. She endorses chest discomfort described as "congestion "it has not appreciably changed with rest or activity. She denies left arm numbness, jaw discomfort, motor sensory disturbances, irregular heartbeat, orthopnea, fevers, chills, confusion.  She requests a refill on fentanyl patches, Percocet, Adderall, and Xanax.  Chart review she was recently discharged from Dr. Tresa Endo practice after [name] controlled substance database reflected Percocet been prescribed by him another provider. On further chart review her orthopedist Dr. Ethelene Hal who was also prescribing Percocet describes in his progress notes that the patient was specifically requesting Percocet and denied fentanyl use despite what is reflected on the [name]. Additionally it appears in June the patient was seen at a pain clinic staff by Dr. Claudette Laws and it appears a UDS was in consistent with her subjective use of medications specifically showing unaccounted for methadone in her urine.    I brought to the patient's attention that she's had 120 pill refills for Percocet since the middle of September and I don't feel that she's due for a refill. She states that her daughter erroneously refilled one of these medications and the pills were thrown out when this was discovered.  She was unable to answer why she was requesting a refill  of Percocet approximately 15 days early. She tells me she takes Xanax "maybe once every 3 days ". I pointed out that the [name] reflects monthly refills and this doesn't correlate with her self-reported dosing, she was to provide an answer for this discrepancy.  Without asking she tells me that she never has cravings or any medications and she "has never had a problem with medications ".   Review Of Systems Outlined In HPI  Past Medical History  Diagnosis Date  . Asthma 04/19/2011  . Depression 04/19/2011  . DJD (degenerative joint disease), cervical 04/19/2011  . UTI (lower urinary tract infection)   . Lumbar disc disease 04/19/2011  . Chronic LBP 04/19/2011  . Anxiety 11/17/2011     Family History  Problem Relation Age of Onset  . Arthritis Other   . Cancer Other     cancer  . Diabetes Other   . Colon cancer Maternal Aunt   . Esophageal cancer Neg Hx   . Rectal cancer Neg Hx   . Stomach cancer Neg Hx      History  Substance Use Topics  . Smoking status: Current Every Day Smoker -- 1.0 packs/day    Types: Cigarettes  . Smokeless tobacco: Never Used  . Alcohol Use: No     Objective: Filed Vitals:   06/05/12 1100  BP: 136/83  Pulse: 99    General: Alert and Oriented, No Acute Distress HEENT: Pupils equal, round, reactive to light. Conjunctivae clear.  Pink inferior turbinates.  Moist mucous membranes, pharynx without inflammation nor lesions.  Neck supple without palpable lymphadenopathy nor abnormal masses. Lungs: Diffuse rhonchi and end  expiratory wheezing without signs of consolidation nor rales.  Comfortable work of breathing. Good air movement. Cardiac: Regular rate and rhythm. Normal S1/S2.  No murmurs, rubs, nor gallops.   Extremities: No peripheral edema.   Mental Status: Mild agitation, resting tremor in bilateral hands. Overall pleasant. Skin: Warm and dry.  Assessment & Plan: Diana Alvarado was seen today for establish care and  shortness of breath.  Diagnoses and associated orders for this visit:  Bronchitis - azithromycin (ZITHROMAX) 250 MG tablet; Take two tabs at once on day 1, then one tab daily on days 2-5. - albuterol (PROVENTIL HFA;VENTOLIN HFA) 108 (90 BASE) MCG/ACT inhaler; Inhale 2 puffs into the lungs every 6 (six) hours as needed for wheezing.  Chronic back pain  Degenerative disc disease  Anxiety  Narcotic Dependence  Discussed with the patient is quite concerned about her medication requests today in light of her irregular and inappropriate filling patterns recently on the [name]. Additionally, her inconsistencies between what is reported in the electronic medical record and her self reported medication regimen as he worried that medications including but not limited to Adderall, Xanax, Ambien, Percocet, fentanyl her causing more harm than good from a psychological and physical standpoint. On 3 separate attempts I expressed to the patient that I was concerned about her physical and psychological well-being on her current medication regimen and I believe immediate detoxification was warranted, I pointed out that there are many drug treatment programs locally be willing to help her with this problem. On all attempts she responded that she was not interested and did not feel that her current medication regimen was negatively affecting her. She was open to the idea of going to a pain management clinic, she tells me she's never been the one before and was unable to answer why her medical record states that she been seen by Dr.Kirsteins in June. I told her that I felt it was inappropriate for her to be prescribed controlled substances at this time and strongly encouraged her to consider psychiatric evaluation, she has accepted a referral to our team downstairs.  Worsening productive cough with shortness of breath and a smoker, suspicion of bacterial bronchitis, she prefers a  Z-Pak instead of doxycycline and will start albuterol. I have asked her to return if no better by Friday for consideration of stepup therapy. Rest return in 4 weeks to ensure psych and pain management are in the works and have asked her to call me if she reconsiders about drug treatment programs between now and then.  35 minutes spent in face-to-face visit today of which at least 50% was counseling or coordinating care.  Return in about 4 weeks (around 07/03/2012).

## 2012-06-08 ENCOUNTER — Other Ambulatory Visit: Payer: Self-pay | Admitting: Internal Medicine

## 2012-06-10 NOTE — Telephone Encounter (Signed)
To forward med request

## 2012-06-12 NOTE — Telephone Encounter (Signed)
See note

## 2012-07-07 ENCOUNTER — Other Ambulatory Visit: Payer: Self-pay | Admitting: Internal Medicine

## 2012-10-23 ENCOUNTER — Ambulatory Visit: Payer: Medicare Other | Admitting: Internal Medicine

## 2012-12-02 ENCOUNTER — Emergency Department (HOSPITAL_COMMUNITY)
Admission: EM | Admit: 2012-12-02 | Discharge: 2012-12-02 | Disposition: A | Discharge status: Home / Self Care | Payer: Medicare Other | Attending: Emergency Medicine | Admitting: Emergency Medicine

## 2012-12-02 ENCOUNTER — Encounter (HOSPITAL_COMMUNITY): Payer: Self-pay | Admitting: Emergency Medicine

## 2012-12-02 ENCOUNTER — Emergency Department (HOSPITAL_COMMUNITY): Payer: Medicare Other

## 2012-12-02 DIAGNOSIS — M1990 Unspecified osteoarthritis, unspecified site: Secondary | ICD-10-CM

## 2012-12-02 DIAGNOSIS — M6740 Ganglion, unspecified site: Secondary | ICD-10-CM | POA: Insufficient documentation

## 2012-12-02 DIAGNOSIS — M67431 Ganglion, right wrist: Secondary | ICD-10-CM

## 2012-12-02 DIAGNOSIS — Z87440 Personal history of urinary (tract) infections: Secondary | ICD-10-CM | POA: Insufficient documentation

## 2012-12-02 DIAGNOSIS — F329 Major depressive disorder, single episode, unspecified: Secondary | ICD-10-CM | POA: Insufficient documentation

## 2012-12-02 DIAGNOSIS — Z8739 Personal history of other diseases of the musculoskeletal system and connective tissue: Secondary | ICD-10-CM | POA: Insufficient documentation

## 2012-12-02 DIAGNOSIS — G8929 Other chronic pain: Secondary | ICD-10-CM | POA: Insufficient documentation

## 2012-12-02 DIAGNOSIS — Z79899 Other long term (current) drug therapy: Secondary | ICD-10-CM | POA: Insufficient documentation

## 2012-12-02 DIAGNOSIS — M19039 Primary osteoarthritis, unspecified wrist: Secondary | ICD-10-CM | POA: Insufficient documentation

## 2012-12-02 DIAGNOSIS — F17200 Nicotine dependence, unspecified, uncomplicated: Secondary | ICD-10-CM | POA: Insufficient documentation

## 2012-12-02 DIAGNOSIS — F411 Generalized anxiety disorder: Secondary | ICD-10-CM | POA: Insufficient documentation

## 2012-12-02 DIAGNOSIS — F3289 Other specified depressive episodes: Secondary | ICD-10-CM | POA: Insufficient documentation

## 2012-12-02 DIAGNOSIS — J45909 Unspecified asthma, uncomplicated: Secondary | ICD-10-CM | POA: Insufficient documentation

## 2012-12-02 IMAGING — CR DG WRIST COMPLETE 3+V*R*
4 series · 4 of 4 positions shown · non-contrast
Comparison: None.

CLINICAL DATA: Left wrist pain

RIGHT WRIST - COMPLETE 3+ VIEW

[x wrist pa right]
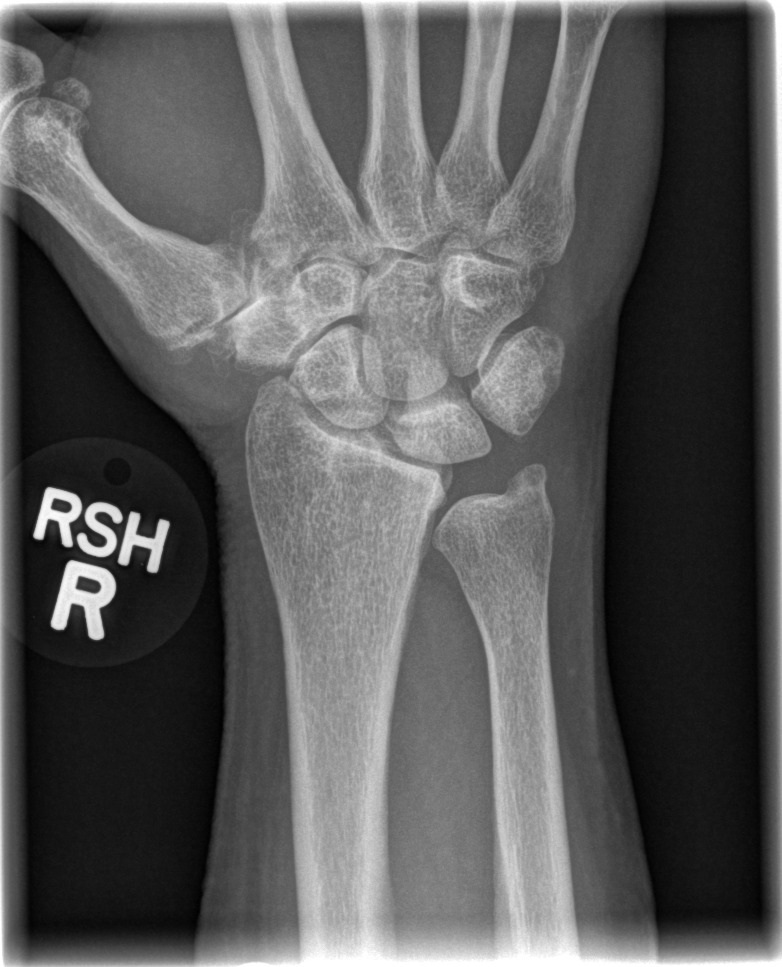

[x wrist obl right]
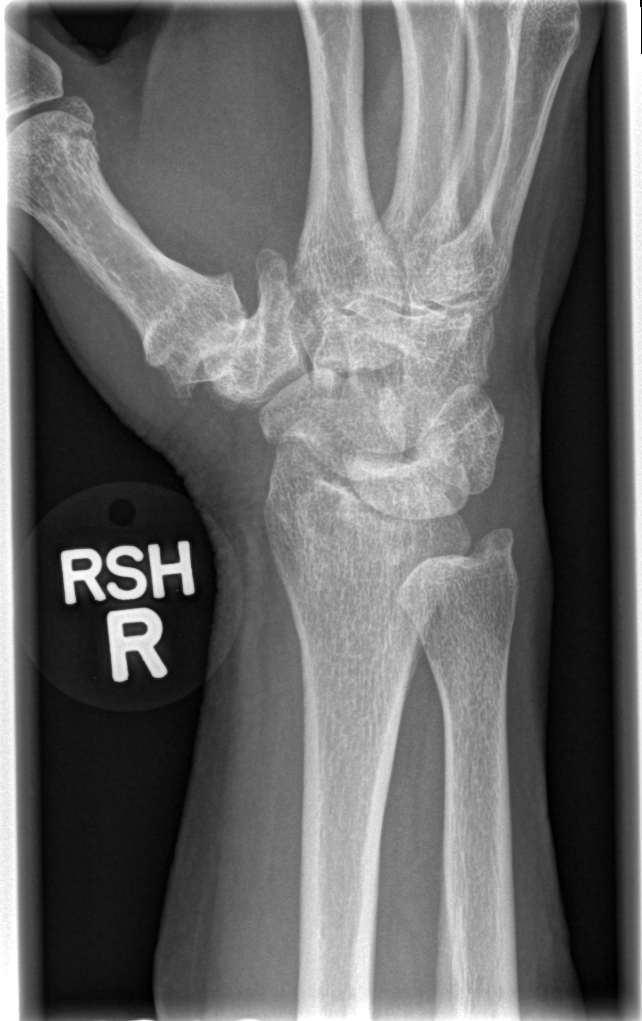

[x wrist lat right]
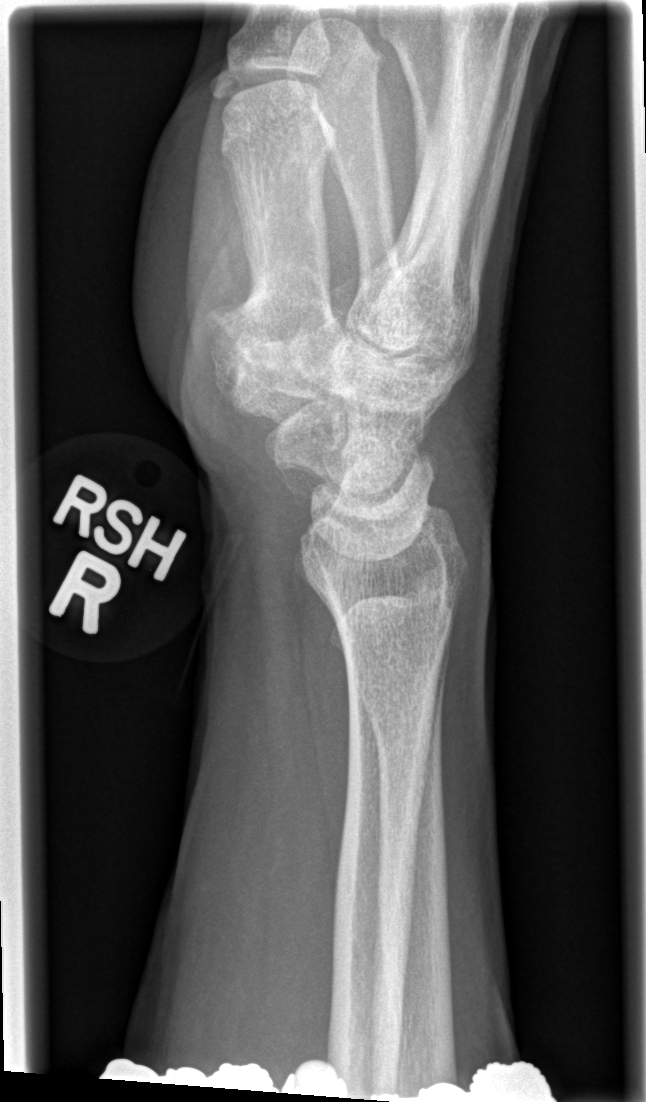

[x navicular]
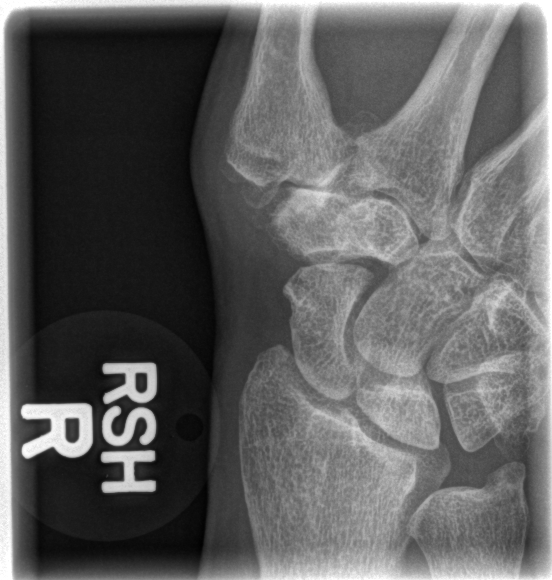

[4 of 4 positions shown; findings below may reference images not displayed]

FINDINGS: No fracture or dislocation is noted.  Narrowing and
osteophyte formation is seen involving the first carpometacarpal
joint consistent with osteoarthritis.  No radiopaque foreign body
or other soft tissue abnormality is noted.
IMPRESSION: Degenerative joint disease of first carpometacarpal joint.  No
acute abnormality seen in the right wrist.

## 2012-12-02 MED ORDER — HYDROCODONE-ACETAMINOPHEN 5-325 MG PO TABS: 2.0000 | ORAL_TABLET | Freq: Four times a day (QID) | ORAL | Status: DC | PRN

## 2012-12-02 NOTE — ED Provider Notes (Signed)
History     CSN: [id number]  Arrival date & time 12/02/12  1158   First MD Initiated Contact with Patient 12/02/12 1225      Chief Complaint  Patient presents with  . Wrist Pain    (Consider location/radiation/quality/duration/timing/severity/associated sxs/prior treatment) HPI Comments: Patient reports that she has had pain of her right wrist for the past 2 days.  She has also noticed a bump on the dorsal aspect of the wrist.  She denies any acute injury or trauma.  She does have a history of arthritis in her fingers.  She denies any erythema, warmth, or swelling of the wrist.  No numbness or tingling.  She has been taking Tylenol for the pain without relief.  Pain worse with movement.  Patient is a 57 y.o. female presenting with wrist pain. The history is provided by the patient.  Wrist Pain This is a new problem. Episode onset: Two days ago. The problem occurs constantly. The problem has been gradually worsening. Pertinent negatives include no chills, fever, joint swelling or numbness.    Past Medical History  Diagnosis Date  . Asthma 04/19/2011  . Depression 04/19/2011  . DJD (degenerative joint disease), cervical 04/19/2011  . UTI (lower urinary tract infection)   . Lumbar disc disease 04/19/2011  . Chronic LBP 04/19/2011  . Anxiety 11/17/2011    Past Surgical History  Procedure Laterality Date  . Gall bladder  2005  . Abdominal hysterectomy  1997  . Ruptured disc  1992    repaired ruptured disc, neck  . Neck surgery  1995    Family History  Problem Relation Age of Onset  . Arthritis Other   . Cancer Other     cancer  . Diabetes Other   . Colon cancer Maternal Aunt   . Esophageal cancer Neg Hx   . Rectal cancer Neg Hx   . Stomach cancer Neg Hx     History  Substance Use Topics  . Smoking status: Current Every Day Smoker -- 1.00 packs/day    Types: Cigarettes  . Smokeless tobacco: Never Used  . Alcohol Use: No    OB History   Grav Para Term Preterm  Abortions TAB SAB Ect Mult Living                  Review of Systems  Constitutional: Negative for fever and chills.  Musculoskeletal: Negative for joint swelling.       Wrist pain  Neurological: Negative for numbness.    Allergies  Aspirin; Codeine; and Sulfonamide derivatives  Home Medications   Current Outpatient Rx  Name  Route  Sig  Dispense  Refill  . albuterol (PROVENTIL HFA;VENTOLIN HFA) 108 (90 BASE) MCG/ACT inhaler   Inhalation   Inhale 2 puffs into the lungs every 6 (six) hours as needed for wheezing or shortness of breath.         . ALPRAZolam (XANAX) 1 MG tablet   Oral   Take 1 mg by mouth 2 (two) times daily as needed for anxiety.         Marland Kitchen amphetamine-dextroamphetamine (ADDERALL) 30 MG tablet   Oral   Take 30 mg by mouth 2 (two) times daily.         . ARIPiprazole (ABILIFY) 10 MG tablet   Oral   Take 1 tablet (10 mg total) by mouth daily.   90 tablet   3   . DULoxetine (CYMBALTA) 60 MG capsule   Oral   Take 60 mg  by mouth daily.         . traZODone (DESYREL) 100 MG tablet   Oral   Take 200 mg by mouth at bedtime.         Marland Kitchen zolpidem (AMBIEN) 10 MG tablet   Oral   Take 10 mg by mouth at bedtime as needed for sleep.            BP 117/72  Pulse 102  Temp(Src) 98.2 F (36.8 C) (Oral)  Resp 18  Ht 5\' 1"  (1.549 m)  Wt 130 lb (58.968 kg)  BMI 24.58 kg/m2  SpO2 98%  Physical Exam  Nursing note and vitals reviewed. Constitutional: She appears well-developed and well-nourished. No distress.  HENT:  Head: Normocephalic and atraumatic.  Neck: Normal range of motion. Neck supple.  Cardiovascular: Normal rate, regular rhythm and normal heart sounds.   Pulses:      Radial pulses are 2+ on the right side, and 2+ on the left side.  Pulmonary/Chest: Effort normal and breath sounds normal.  Musculoskeletal: Normal range of motion.       Right wrist: She exhibits normal range of motion, no swelling and no effusion.  Ganglion cyst of the  dorsal aspect of the right wrist  Neurological: She is alert. No sensory deficit.  Skin: Skin is warm and dry. She is not diaphoretic. No erythema.  Psychiatric: She has a normal mood and affect.    ED Course  Procedures (including critical care time)  Labs Reviewed - No data to display Dg Wrist Complete Right  12/02/2012  *RADIOLOGY REPORT*  Clinical Data: Left wrist pain  RIGHT WRIST - COMPLETE 3+ VIEW  Comparison: None.  Findings: No fracture or dislocation is noted.  Narrowing and osteophyte formation is seen involving the first carpometacarpal joint consistent with osteoarthritis.  No radiopaque foreign body or other soft tissue abnormality is noted.  IMPRESSION: Degenerative joint disease of first carpometacarpal joint.  No acute abnormality seen in the right wrist.   Original Report Authenticated By: Lupita Raider.,  M.D.      No diagnosis found.    MDM  Patient presenting with pain of the right wrist.  No acute injury.  Xray showing degenerative joint disease.  Patient found to have a Ganglion Cyst on physical exam.  Patient does not have any redness or warmth.  Full ROM of the wrist.  Therefore, cause of the pain does not appear to be infectious.  Xray showing degenerative changes.  Patient given short course of pain medication and instructed to follow up with PCP.        Pascal Lux [place], PA-C 12/02/12 1821

## 2012-12-02 NOTE — ED Notes (Addendum)
Pt c/o right wrist pain with knots on bone areas x 2 days with pain; pt denies injury

## 2012-12-03 NOTE — ED Provider Notes (Signed)
Medical screening examination/treatment/procedure(s) were performed by non-physician practitioner and as supervising physician I was immediately available for consultation/collaboration.  Lacrecia Delval, MD 12/03/12 0733 

## 2012-12-24 DIAGNOSIS — M6740 Ganglion, unspecified site: Secondary | ICD-10-CM | POA: Insufficient documentation

## 2012-12-24 DIAGNOSIS — M5020 Other cervical disc displacement, unspecified cervical region: Secondary | ICD-10-CM | POA: Insufficient documentation

## 2013-05-08 ENCOUNTER — Other Ambulatory Visit: Payer: Self-pay | Admitting: *Deleted

## 2013-05-08 ENCOUNTER — Other Ambulatory Visit: Payer: Self-pay

## 2013-05-13 ENCOUNTER — Other Ambulatory Visit: Payer: Self-pay | Admitting: Family Medicine

## 2013-05-13 DIAGNOSIS — Z780 Asymptomatic menopausal state: Secondary | ICD-10-CM

## 2013-05-13 DIAGNOSIS — E2839 Other primary ovarian failure: Secondary | ICD-10-CM

## 2013-05-22 ENCOUNTER — Other Ambulatory Visit: Payer: Medicare Other

## 2013-12-08 DIAGNOSIS — G4700 Insomnia, unspecified: Secondary | ICD-10-CM | POA: Insufficient documentation

## 2016-09-12 DIAGNOSIS — I728 Aneurysm of other specified arteries: Secondary | ICD-10-CM | POA: Insufficient documentation

## 2017-02-21 ENCOUNTER — Institutional Professional Consult (permissible substitution): Payer: Medicare Other | Admitting: Neurology

## 2017-03-28 ENCOUNTER — Institutional Professional Consult (permissible substitution): Payer: Medicare Other | Admitting: Neurology

## 2017-04-02 ENCOUNTER — Encounter: Payer: Self-pay | Admitting: Neurology

## 2017-04-30 ENCOUNTER — Telehealth: Payer: Self-pay | Admitting: Neurology

## 2017-04-30 ENCOUNTER — Institutional Professional Consult (permissible substitution): Payer: Medicare Other | Admitting: Neurology

## 2017-04-30 ENCOUNTER — Other Ambulatory Visit: Payer: Self-pay | Admitting: Orthopedic Surgery

## 2017-04-30 DIAGNOSIS — M533 Sacrococcygeal disorders, not elsewhere classified: Secondary | ICD-10-CM

## 2017-04-30 NOTE — Telephone Encounter (Signed)
Do not reschedule , 2 no shows is enough.

## 2017-04-30 NOTE — Telephone Encounter (Signed)
Pt no showed on 03/28/17 w/Dr. Rexene Alberts. She was rescheduled with Dr. Brett Fairy. Pt called she will not make her appt for today w/Dr. Brett Fairy. Do the doctors want to dismiss the pt

## 2017-05-01 ENCOUNTER — Encounter: Payer: Self-pay | Admitting: Neurology

## 2017-05-16 ENCOUNTER — Inpatient Hospital Stay
Admission: RE | Admit: 2017-05-16 | Discharge: 2017-05-16 | Disposition: A | Discharge status: Home / Self Care | Payer: Self-pay | Source: Ambulatory Visit | Attending: Orthopedic Surgery | Admitting: Orthopedic Surgery

## 2017-05-16 ENCOUNTER — Other Ambulatory Visit: Payer: Self-pay

## 2017-05-25 ENCOUNTER — Ambulatory Visit
Admission: RE | Admit: 2017-05-25 | Discharge: 2017-05-25 | Disposition: A | Discharge status: Home / Self Care | Payer: Medicare Other | Source: Ambulatory Visit | Attending: Orthopedic Surgery | Admitting: Orthopedic Surgery

## 2017-05-25 DIAGNOSIS — M533 Sacrococcygeal disorders, not elsewhere classified: Secondary | ICD-10-CM

## 2017-05-28 ENCOUNTER — Telehealth: Payer: Self-pay | Admitting: Radiology

## 2017-05-28 NOTE — Telephone Encounter (Signed)
Pt states she fell after going home Friday, post CT guided SI joint injection. Pt wanted to know if her leg getting numb after she got home was normal. Explained that yes it could happen but rarely after she had gotten home. Would have expected her to be numb before exiting the building

## 2018-02-04 DIAGNOSIS — M7541 Impingement syndrome of right shoulder: Secondary | ICD-10-CM | POA: Insufficient documentation

## 2018-02-25 ENCOUNTER — Other Ambulatory Visit: Payer: Self-pay

## 2018-02-25 ENCOUNTER — Emergency Department (HOSPITAL_COMMUNITY)
Admission: EM | Admit: 2018-02-25 | Discharge: 2018-02-25 | Discharge status: Against Medical Advice | Payer: Medicare Other

## 2018-03-25 ENCOUNTER — Ambulatory Visit: Payer: Medicare Other | Admitting: Nurse Practitioner

## 2018-03-26 ENCOUNTER — Ambulatory Visit: Payer: Medicare Other | Attending: Nurse Practitioner | Admitting: Nurse Practitioner

## 2018-03-26 ENCOUNTER — Other Ambulatory Visit: Payer: Self-pay

## 2018-03-26 ENCOUNTER — Encounter: Payer: Self-pay | Admitting: Nurse Practitioner

## 2018-03-26 VITALS — BP 129/77 | HR 87 | Temp 98.1°F | Ht 61.0 in | Wt 150.0 lb

## 2018-03-26 DIAGNOSIS — M5442 Lumbago with sciatica, left side: Secondary | ICD-10-CM | POA: Diagnosis not present

## 2018-03-26 DIAGNOSIS — G8929 Other chronic pain: Secondary | ICD-10-CM

## 2018-03-26 DIAGNOSIS — Z79899 Other long term (current) drug therapy: Secondary | ICD-10-CM

## 2018-03-26 DIAGNOSIS — M25552 Pain in left hip: Secondary | ICD-10-CM | POA: Insufficient documentation

## 2018-03-26 DIAGNOSIS — M542 Cervicalgia: Secondary | ICD-10-CM | POA: Diagnosis not present

## 2018-03-26 DIAGNOSIS — Z79891 Long term (current) use of opiate analgesic: Secondary | ICD-10-CM

## 2018-03-26 DIAGNOSIS — F17210 Nicotine dependence, cigarettes, uncomplicated: Secondary | ICD-10-CM | POA: Diagnosis not present

## 2018-03-26 DIAGNOSIS — F411 Generalized anxiety disorder: Secondary | ICD-10-CM | POA: Insufficient documentation

## 2018-03-26 DIAGNOSIS — M79604 Pain in right leg: Secondary | ICD-10-CM

## 2018-03-26 DIAGNOSIS — M961 Postlaminectomy syndrome, not elsewhere classified: Secondary | ICD-10-CM | POA: Insufficient documentation

## 2018-03-26 DIAGNOSIS — F329 Major depressive disorder, single episode, unspecified: Secondary | ICD-10-CM | POA: Insufficient documentation

## 2018-03-26 DIAGNOSIS — M899 Disorder of bone, unspecified: Secondary | ICD-10-CM

## 2018-03-26 DIAGNOSIS — G894 Chronic pain syndrome: Secondary | ICD-10-CM | POA: Insufficient documentation

## 2018-03-26 DIAGNOSIS — Z789 Other specified health status: Secondary | ICD-10-CM

## 2018-03-26 DIAGNOSIS — M79642 Pain in left hand: Secondary | ICD-10-CM

## 2018-03-26 DIAGNOSIS — M79641 Pain in right hand: Secondary | ICD-10-CM

## 2018-03-26 DIAGNOSIS — M79605 Pain in left leg: Secondary | ICD-10-CM

## 2018-03-26 NOTE — Patient Instructions (Signed)

## 2018-03-26 NOTE — Progress Notes (Signed)
Patient's Name: Diana Alvarado  MRN: [id number]  Referring Provider: Normajean Glasgow, MD  DOB: 03-Apr-1956  PCP: Default, Provider, MD  DOS: 03/26/2018  Note by: Dionisio David NP  Service setting: Ambulatory outpatient  Specialty: Interventional Pain Management  Location: [facility] (AMB) Pain Management Facility    Patient type: New Patient    Primary Reason(s) for Visit: Initial Patient Evaluation CC: Back Pain (lower); Neck Pain; and Hand Pain (bilateral)  HPI  Ms. Staff is a 62 y.o. year old, female patient, who comes today for an initial evaluation. She has Asthma; Major depression; DJD (degenerative joint disease), cervical; Preventative health care; Lumbar disc disease; Chronic LBP; Anxiety state; Cervical post-laminectomy syndrome; Cigarette nicotine dependence without complication; Attention deficit disorder; DDD (degenerative disc disease), cervical; Ganglion cyst; Generalized anxiety disorder; Impingement syndrome of right shoulder region; Insomnia; Personal history of drug dependence ([place]); Protrusion of cervical intervertebral disc; Splenic artery aneurysm (Uncertain); Chronic bilateral low back pain with left-sided sciatica (Primary Area of Pain); Chronic pain of left lower extremity (Secondary Area of Pain); Chronic neck pain (Tertiary Area of Pain); Pain in both hands (Fourth Area of Pain); Chronic hip pain, left; Chronic pain syndrome; Long term current use of opiate analgesic; Pharmacologic therapy; Disorder of skeletal system; and Problems influencing health status on their problem list.. Her primarily concern today is the Back Pain (lower); Neck Pain; and Hand Pain (bilateral)  Pain Assessment: Location: Lower Back Radiating: left hip down back of leg to bottom of foot Onset: More than a month ago Duration: Chronic pain Quality: Aching, Throbbing, Constant, Discomfort, Sharp Severity: 7 /10 (subjective, self-reported pain score)  Note: Reported level is compatible with observation. Clinically the  patient looks like a 1/10 A 1/10 is viewed as "Mild" and described as nagging, annoying, but not interfering with basic activities of daily living (ADL). [name] is able to eat, bathe, get dressed, do toileting (being able to get on and off the toilet and perform personal hygiene functions), transfer (move in and out of bed or a chair without assistance), and maintain continence (able to control bladder and bowel functions). Physiologic parameters such as blood pressure and heart rate apear wnl. Information on the proper use of the pain scale provided to the patient today. When using our objective Pain Scale, levels between 6 and 10/10 are said to belong in an emergency room, as it progressively worsens from a 6/10, described as severely limiting, requiring emergency care not usually available at an outpatient pain management facility. At a 6/10 level, communication becomes difficult and requires great effort. Assistance to reach the emergency department may be required. Facial flushing and profuse sweating along with potentially dangerous increases in heart rate and blood pressure will be evident. Effect on ADL: cant lie left side, hard to climb steps, limit mobility, cant sleep Timing: Constant Modifying factors: heat, lying on right side BP: 129/77  HR: 87  Onset and Duration: Date of injury: 5 years ago Cause of pain: Unknown Severity: Getting worse, NAS-11 at its worse: 8/10, NAS-11 at its best: 6/10, NAS-11 now: 7/10 and NAS-11 on the average: 6/10 Timing: Morning, Night and After a period of immobility Aggravating Factors: Bending, Climbing, Prolonged sitting, Prolonged standing, Squatting, Stooping , Twisting, Walking uphill and Working Alleviating Factors: Hot packs, Lying down, Medications, Resting and Warm showers or baths Associated Problems: Constipation, Depression, Fatigue, Sadness, Weakness and Pain that wakes patient up Quality of Pain: Aching, Burning, Constant, Deep, Disabling,  Heavy, Horrible, Nagging, Sharp, Stabbing, Throbbing and Toothache-like  Previous Examinations or Tests: Epidurogram, MRI scan, X-rays, Nerve conduction test and Orthopedic evaluation Previous Treatments: Epidural steroid injections, Narcotic medications, Pool exercises and Trigger point injections  The patient comes into the clinics today for the first time for a chronic pain management evaluation.  According to the patient her primary area of pain is in her lower back.  He admits that she has been having this pain for approximately 5 years.  Denies any precipitating factors.  She admits the pain is worse on the left.  She denies any previous surgery.  She has had an epidural steroid injection by different Ortho which was effective for her back pain.  She later states she had trigger point injections by Dr. Nelva Bush 5 years ago which helped a little. She has had physical therapy.  She states that it was not effective for her pain.  She had an MRI done 2018.  Her second area of pain is in her legs.  She admits that it goes down the left leg into the bottom of her foot.  She denies any toes being involved.  She denies any numbness or tingling.  She admits that she does have weakness.  She admits that she has had a nerve conduction study many years ago in [facility].  Her third area pain is in her neck.  She admits that she has limited range of motion.  She is status post cervical fusion for ruptured disc 10 years ago in [place].  She admits she did have a bone graft from her left hip.  She denies any previous injections, physical therapy or recent images.  Her fourth area of pain is in her hands.  She admits that she was diagnosed with osteoarthritis.  She has pain and swelling.  She denies any previous surgery, interventional therapy or physical therapy.  She denies any recent images.  Today I took the time to provide the patient with information regarding this pain practice. The patient  was informed that the practice is divided into two sections: an interventional pain management section, as well as a completely separate and distinct medication management section. I explained that there are procedure days for interventional therapies, and evaluation days for follow-ups and medication management. Because of the amount of documentation required during both, they are kept separated. This means that there is the possibility that she may be scheduled for a procedure on one day, and medication management the next. I have also informed her that because of staffing and facility limitations, this practice will no longer take patients for medication management only. To illustrate the reasons for this, I gave the patient the example of surgeons, and how inappropriate it would be to refer a patient to his/her care, just to write for the post-surgical antibiotics on a surgery done by a different surgeon.   Because interventional pain management is part of the board-certified specialty for the doctors, the patient was informed that joining this practice means that they are open to any and all interventional therapies. I made it clear that this does not mean that they will be forced to have any procedures done. What this means is that I believe interventional therapies to be essential part of the diagnosis and proper management of chronic pain conditions. Therefore, patients not interested in these interventional alternatives will be better served under the care of a different practitioner.  The patient was also made aware of my Comprehensive Pain Management Safety Guidelines where by joining this practice,  they limit all of their nerve blocks and joint injections to those done by our practice, for as long as we are retained to manage their care. Historic Controlled Substance Pharmacotherapy Review  PMP and historical list of controlled substances: Dextroamp amphetamine 30 mg, zolpidem 10 mg,  hydrocodone/acetaminophen 5/325 mg, buprenorphine 10 mcg patch, acetaminophen codeine No. 3, diazepam 5 mg, Lyrica 100 mg, hydrocodone/homo-atropine syrup, oxycodone 5 mg, Suboxone 2 mg /0.5 sublingual film, fentanyl 75 mcg, fentanyl 100 mcg, oxycodone 10 mg, oxycodone 15 mg, presently M0 0.5 mg, tramadol 50 mg, oxymorphone 10 mg, oxycodone 20 mg, ketamine HCl powder, hydrocodone/acetaminophen 7.5/325 mg, phentermine 37.5 mg, Highest opioid analgesic regimen found: Fentanyl 100 mcg every 42 hours plus oxycodone 20 mg 4 times daily (last fill date 10/24/2013) fentanyl 100 mcg plus oxycodone 80 mg/day Most recent opioid analgesic: Hydrocodone/acetaminophen 5/325 1 tablet 3 times daily (fill date 12/17/17 ) hydrocodone 15 mg/day Current opioid analgesics: None Highest recorded MME/day: 480 mg/day ( PMP overdose risk assessment 690) MME/day: 0 mg/day Medications: The patient did not bring the medication(s) to the appointment, as requested in our "New Patient Package" Pharmacodynamics: Desired effects: Analgesia: The patient reports >50% benefit. Reported improvement in function: The patient reports medication allows her to accomplish basic ADLs. Clinically meaningful improvement in function (CMIF): Sustained CMIF goals met Perceived effectiveness: Described as relatively effective, allowing for increase in activities of daily living (ADL) Undesirable effects: Side-effects or Adverse reactions: None reported Historical Monitoring: The patient  reports that she does not use drugs. List of all UDS Test(s): No results found for: MDMA, COCAINSCRNUR, PCPSCRNUR, PCPQUANT, CANNABQUANT, THCU, [place] List of all Serum Drug Screening Test(s):  No results found for: AMPHSCRSER, BARBSCRSER, BENZOSCRSER, COCAINSCRSER, PCPSCRSER, PCPQUANT, THCSCRSER, CANNABQUANT, OPIATESCRSER, OXYSCRSER, PROPOXSCRSER Historical Background Evaluation: [place] PDMP: Six (6) year initial data search conducted. Abnormal pattern detected. A  pattern of multiple prescribers and multiple pharmacies was identified  PMP overdose risk assessment [address] [place] Department of public safety, offender search: Editor, commissioning Information) Non-contributory Risk Assessment Profile: Aberrant behavior: drug seeking behavior Repeatly asking nurse if she is going to be able to get medication at her next visit Risk factors for fatal opioid overdose: caucasian, family history of addiction, high daily dosage , history of multiple prescribers, multiple prescribers and mulitple prescriptions Fatal overdose hazard ratio (HR): Calculation deferred Non-fatal overdose hazard ratio (HR): Calculation deferred Risk of opioid abuse or dependence: 0.7-3.0% with doses ? 36 MME/day and 6.1-26% with doses ? 120 MME/day. Substance use disorder (SUD) risk level: Pending results of Medical Psychology Evaluation for SUD Opioid risk tool (ORT) (Total Score): 3  ORT Scoring interpretation table:  Score <3 = Low Risk for SUD  Score between 4-7 = Moderate Risk for SUD  Score >8 = High Risk for Opioid Abuse   PHQ-2 Depression Scale:  Total score: 0  PHQ-2 Scoring interpretation table: (Score and probability of major depressive disorder)  Score 0 = No depression  Score 1 = 15.4% Probability  Score 2 = 21.1% Probability  Score 3 = 38.4% Probability  Score 4 = 45.5% Probability  Score 5 = 56.4% Probability  Score 6 = 78.6% Probability   PHQ-9 Depression Scale:  Total score: 0  PHQ-9 Scoring interpretation table:  Score 0-4 = No depression  Score 5-9 = Mild depression  Score 10-14 = Moderate depression  Score 15-19 = Moderately severe depression  Score 20-27 = Severe depression (2.4 times higher risk of SUD and 2.89 times higher risk of overuse)   Pharmacologic  Plan: Pending ordered tests and/or consults  Meds  The patient has a current medication list which includes the following prescription(s): acetaminophen, albuterol, amphetamine-dextroamphetamine, aripiprazole,  duloxetine, zolpidem, and trazodone.  Current Outpatient Medications on File Prior to Visit  Medication Sig  . acetaminophen (TYLENOL) 500 MG tablet Take 500 mg by mouth every 6 (six) hours as needed.  Marland Kitchen albuterol (PROVENTIL HFA;VENTOLIN HFA) 108 (90 BASE) MCG/ACT inhaler Inhale 2 puffs into the lungs every 6 (six) hours as needed for wheezing or shortness of breath.  . amphetamine-dextroamphetamine (ADDERALL) 30 MG tablet Take 30 mg by mouth 2 (two) times daily.  . ARIPiprazole (ABILIFY) 15 MG tablet Take 10 mg by mouth daily.   . DULoxetine (CYMBALTA) 60 MG capsule Take 60 mg by mouth daily.  Marland Kitchen zolpidem (AMBIEN) 10 MG tablet Take 10 mg by mouth at bedtime as needed for sleep.   . traZODone (DESYREL) 100 MG tablet Take 200 mg by mouth at bedtime.   No current facility-administered medications on file prior to visit.    Imaging Review  Lumbosacral Imaging:  Lumbar DG Epidurogram OP:  Results for orders placed in visit on 12/13/00  DG Epidurography   Narrative FINDINGS CLINICAL DATA: LOW LEFT BACK PAIN.   DR. Nelva Bush REQUESTS LUMBAR EPIDURAL AND L4-5 AND L5-S1 FACET INJECTIONS LEFT L4-5 FACET INJECTION: A CURVED 22 GAUGE SPINAL NEEDLE WAS DIRECTED INTO THE SUPERIOR RECESS OF THE L4-5 FACET JOINT ON THE LEFT.   INTRA-ARTICULAR POSITIONING WAS CONFIRMED BY INJECTING A SMALL AMOUNT OF OMNIPAQUE 180.   40 MG DEPO-MEDROL AND 1 CC OF 0.5% BUPIVACAINE WERE INSTILLED INTO THE JOINT.  THE INJECTION RESULTED IN A CONCORDANT PAIN. IMPRESSION TECHNICALLY SUCCESSFUL LEFT L4-5 FACET INJECTION.   CONCORDANT PAIN DURING THE INJECTION. LEFT L5-S1 FACET INJECTION: A SIMILAR PROCEDURE WAS PERFORMED USING A CURVED 22 GAUGE SPINAL NEEDLE.   INTERARTICULAR POSITIONING WAS AGAIN CONFIRMED BY INJECTING A SMALL AMOUNT OF OMNIPAQUE 180.   40 MG DEPO-MEDROL AND 1 CC OF 0.5% BUPIVACAINE WERE INSTILLED INTO THE JOINT.   THIS INJECTION DID RESULT IN SOME FAMILIAR PAIN, BUT NOT AS MUCH AS THE L4-5  LEVEL. IMPRESSION: TECHNICALLY SUCCESSFUL L5-S1 FACET INJECTION WITH SOME CONCORDANT PAIN ALTHOUGH LESS THAN SEEN AT THE L4-5 LEVEL. LEFT L5-S1 INTERLAMINAR EPIDURAL: A POSTERIOR APPROACH WAS TAKEN ON THE LEFT AT L5-S1.  A 20 GAUGE CRAWFORD EPIDURAL NEEDLE WAS ADVANCED USING LOSS-OF-RESISTANCE TECHNIQUE. DIAGNOSTIC EPIDURAL INJECTION: INJECTION OF OMNIPAQUE 180 SHOWS A GOOD EPIDURAL PATTERN WITH SPREAD ABOVE AND BELOW  LEVEL OF NEEDLE PLACEMENT, RESTRICTED TO THE LEFT. THERAPEUTIC EPIDURAL INJECTION: 40 MG OF DEPO-MEDROL MIXED WITH 5 CC 1% LIDOCAINE WERE THEN INSTILLED.  THE PROCEDURE WAS WELL- TOLERATED AND THE PATIENT WAS DISCHARGED 30 MINUTES FOLLOWING THE INJECTION IN GOOD CONDITION FEELING CONSIDERABLE RELIEF.  IT IS NOT POSSIBLE TO TELL WHETHER HER RELIEF IS COMING FROM THE EPIDURAL OR THE FACET INJECTIONS AT THIS POINT. IMPRESSION: TECHNICALLY SUCCESSFUL INITIAL LUMBAR EPIDURAL ON THE LEFT AT L5-S1 AS DESCRIBED.  Spine Imaging:  Results for orders placed during the hospital encounter of 05/25/17  CT BIOPSY   Narrative INDICATION: Suspected LEFT SI joint dysfunction.  EXAM: CT-GUIDED LEFT SI JOINT INJECTION ANESTHETIC ONLY.  MEDICATIONS: Described below.  ANESTHESIA/SEDATION: No conscious sedation was employed.  FLUOROSCOPY TIME:  Estimated CT DI of 20 mGy.  COMPLICATIONS: None immediate.  PROCEDURE: Informed written consent was obtained from the patient after a thorough discussion of the procedural risks, benefits and alternatives. All questions were addressed. Maximal Sterile Barrier Technique was utilized  including mask, sterile gloves, sterile drape, hand hygiene and skin antiseptic. A timeout was performed prior to the initiation of the procedure.  An appropriate skin entry site was chosen and anesthetized with 1% lidocaine. A 20 gauge spinal needle was placed in the LEFT SI joint from a posterior approach. Intraarticular positioning was confirmed with a  small amount of Isovue-M 200. Subsequently, the joint was anesthetized with 2.5 mL of 0.5% Sensorcaine. Postprocedure the patient reports she is pain-free from her typical pain at home.  IMPRESSION: Technically successful LEFT sacroiliac joint injection under CT guidance, anesthetic only. Patient reports she is currently pain-free.   Electronically Signed   By: Staci Righter M.D.   On: 05/25/2017 15:21      Note: Available results from prior imaging studies were reviewed.        ROS  Cardiovascular History: No reported cardiovascular signs or symptoms such as High blood pressure, coronary artery disease, abnormal heart rate or rhythm, heart attack, blood thinner therapy or heart weakness and/or failure Pulmonary or Respiratory History: Smoking, Snoring  and Coughing up mucus (Bronchitis) Neurological History: No reported neurological signs or symptoms such as seizures, abnormal skin sensations, urinary and/or fecal incontinence, being born with an abnormal open spine and/or a tethered spinal cord Review of Past Neurological Studies: No results found for this or any previous visit. Psychological-Psychiatric History: Depressed Gastrointestinal History: No reported gastrointestinal signs or symptoms such as vomiting or evacuating blood, reflux, heartburn, alternating episodes of diarrhea and constipation, inflamed or scarred liver, or pancreas or irrregular and/or infrequent bowel movements Genitourinary History: No reported renal or genitourinary signs or symptoms such as difficulty voiding or producing urine, peeing blood, non-functioning kidney, kidney stones, difficulty emptying the bladder, difficulty controlling the flow of urine, or chronic kidney disease Hematological History: No reported hematological signs or symptoms such as prolonged bleeding, low or poor functioning platelets, bruising or bleeding easily, hereditary bleeding problems, low energy levels due to low hemoglobin or  being anemic Endocrine History: No reported endocrine signs or symptoms such as high or low blood sugar, rapid heart rate due to high thyroid levels, obesity or weight gain due to slow thyroid or thyroid disease Rheumatologic History: Joint aches and or swelling due to excess weight (Osteoarthritis) and Generalized muscle aches (Fibromyalgia) Musculoskeletal History: Negative for myasthenia gravis, muscular dystrophy, multiple sclerosis or malignant hyperthermia Work History: Disabled  Allergies  Ms. Boultinghouse is allergic to aspirin; codeine; and sulfonamide derivatives.  Laboratory Chemistry  Inflammation Markers No results found for: CRP, ESRSEDRATE (CRP: Acute Phase) (ESR: Chronic Phase) Renal Function Markers Lab Results  Component Value Date   BUN 15 04/19/2012   CREATININE 0.8 04/19/2012   Hepatic Function Markers Lab Results  Component Value Date   AST 19 04/19/2012   ALT 15 04/19/2012   ALBUMIN 4.4 04/19/2012   ALKPHOS 69 04/19/2012   Electrolytes Lab Results  Component Value Date   NA 139 04/19/2012   K 4.3 04/19/2012   CL 103 04/19/2012   CALCIUM 9.3 04/19/2012   Neuropathy Markers No results found for: [id number] Bone Pathology Markers Lab Results  Component Value Date   ALKPHOS 69 04/19/2012   CALCIUM 9.3 04/19/2012   Coagulation Parameters Lab Results  Component Value Date   PLT 170.0 04/19/2012   Cardiovascular Markers Lab Results  Component Value Date   HGB 13.4 04/19/2012   HCT 40.1 04/19/2012   Note: Lab results reviewed.  [place]  Drug: Ms. Karras  reports that she does not use  drugs. Alcohol:  reports that she does not drink alcohol. Tobacco:  reports that she has been smoking cigarettes.  She has a 10.00 pack-year smoking history. She has never used smokeless tobacco. Medical:  has a past medical history of ADHD, Anxiety (11/17/2011), Asthma (04/19/2011), Chronic LBP (04/19/2011), Depression (04/19/2011), DJD (degenerative joint disease), cervical  (04/19/2011), Lumbar disc disease (04/19/2011), and UTI (lower urinary tract infection). Family: family history includes Arthritis in her other; Cancer in her other; Colon cancer in her maternal aunt; Diabetes in her other.  Past Surgical History:  Procedure Laterality Date  . ABDOMINAL HYSTERECTOMY  1997  . gall bladder  2005  . NECK SURGERY  1995  . ruptured disc  1992   repaired ruptured disc, neck   Active Ambulatory Problems    Diagnosis Date Noted  . Asthma 04/19/2011  . Major depression 03/04/2009  . DJD (degenerative joint disease), cervical 04/19/2011  . Preventative health care 04/19/2011  . Lumbar disc disease 04/19/2011  . Chronic LBP 04/19/2011  . Anxiety state 11/17/2011  . Cervical post-laminectomy syndrome 02/06/2012  . Cigarette nicotine dependence without complication 40/04/6760  . Attention deficit disorder 03/04/2009  . DDD (degenerative disc disease), cervical 04/19/2011  . Ganglion cyst 12/24/2012  . Generalized anxiety disorder 03/04/2009  . Impingement syndrome of right shoulder region 02/04/2018  . Insomnia 12/08/2013  . Personal history of drug dependence ([place]) 06/05/2012  . Protrusion of cervical intervertebral disc 12/24/2012  . Splenic artery aneurysm ([place]) 09/12/2016  . Chronic bilateral low back pain with left-sided sciatica (Primary Area of Pain) 03/26/2018  . Chronic pain of left lower extremity (Secondary Area of Pain) 03/26/2018  . Chronic neck pain (Tertiary Area of Pain) 03/26/2018  . Pain in both hands (Fourth Area of Pain) 03/26/2018  . Chronic hip pain, left 03/26/2018  . Chronic pain syndrome 03/26/2018  . Long term current use of opiate analgesic 03/26/2018  . Pharmacologic therapy 03/26/2018  . Disorder of skeletal system 03/26/2018  . Problems influencing health status 03/26/2018   Resolved Ambulatory Problems    Diagnosis Date Noted  . Dysuria 11/17/2011  . Back pain 02/06/2012   Past Medical History:  Diagnosis Date  . ADHD    . Anxiety 11/17/2011  . Asthma 04/19/2011  . Chronic LBP 04/19/2011  . Depression 04/19/2011  . DJD (degenerative joint disease), cervical 04/19/2011  . Lumbar disc disease 04/19/2011  . UTI (lower urinary tract infection)    Constitutional Exam  General appearance: Well nourished, well developed, and well hydrated. In no apparent acute distress Vitals:   03/26/18 0832  BP: 129/77  Pulse: 87  Temp: 98.1 F (36.7 C)  SpO2: 100%  Weight: 150 lb (68 kg)  Height: _0  (1.549 m)   BMI Assessment: Estimated body mass index is 28.34 kg/m as calculated from the following:   Height as of this encounter: _1  (1.549 m).   Weight as of this encounter: 150 lb (68 kg).  BMI interpretation table: BMI level Category Range association with higher incidence of chronic pain  <18 kg/m2 Underweight   18.5-24.9 kg/m2 Ideal body weight   25-29.9 kg/m2 Overweight Increased incidence by 20%  30-34.9 kg/m2 Obese (Class I) Increased incidence by 68%  35-39.9 kg/m2 Severe obesity (Class II) Increased incidence by 136%  >40 kg/m2 Extreme obesity (Class III) Increased incidence by 254%   BMI Readings from Last 4 Encounters:  03/26/18 28.34 kg/m  12/02/12 24.56 kg/m  06/05/12 24.75 kg/m  05/28/12 24.75 kg/m   Wt  Readings from Last 4 Encounters:  03/26/18 150 lb (68 kg)  12/02/12 130 lb (59 kg)  06/05/12 131 lb (59.4 kg)  05/28/12 131 lb (59.4 kg)  Psych/Mental status: Alert, oriented x 3 (person, place, & time)       Eyes: PERLA Respiratory: No evidence of acute respiratory distress  Cervical Spine Exam  Inspection: Well healed scar from previous spine surgery detected Alignment: Symmetrical Functional ROM: Unrestricted ROM      Stability: No instability detected Muscle strength & Tone: Functionally intact Sensory: Unimpaired Palpation: Complains of area being tender to palpation              Upper Extremity (UE) Exam    Side: Right upper extremity  Side: Left upper extremity   Inspection: Bouchard's nodes (PIP)  Inspection: Bouchard's nodes (PIP)  Functional ROM: Decreased ROM          Functional ROM: Decreased ROM          Muscle strength & Tone: Functionally intact  Muscle strength & Tone: Functionally intact  Sensory: Unimpaired  Sensory: Unimpaired  Palpation: No palpable anomalies              Palpation: No palpable anomalies              Specialized Test(s): Deferred         Specialized Test(s): Deferred          Thoracic Spine Exam  Inspection: No masses, redness, or swelling Alignment: Symmetrical Functional ROM: Unrestricted ROM Stability: No instability detected Sensory: Unimpaired Muscle strength & Tone: No palpable anomalies  Lumbar Spine Exam  Inspection: No masses, redness, or swelling Alignment: Symmetrical Functional ROM: Unrestricted ROM      Stability: No instability detected Muscle strength & Tone: Functionally intact Sensory: Unimpaired Palpation: No palpable anomalies       Provocative Tests: Lumbar Hyperextension and rotation test: evaluation deferred today       Patrick's Maneuver: evaluation deferred today                    Gait & Posture Assessment  Ambulation: Unassisted Gait: Relatively normal for age and body habitus Posture: WNL   Lower Extremity Exam    Side: Right lower extremity  Side: Left lower extremity  Inspection: No masses, redness, swelling, or asymmetry. No contractures  Inspection: No masses, redness, swelling, or asymmetry. No contractures  Functional ROM: Full ROM          Functional ROM: Full ROM          Muscle strength & Tone: Functionally intact  Muscle strength & Tone: Guarding  Sensory: Unimpaired  Sensory: Unimpaired  Palpation: No palpable anomalies  Palpation: No palpable anomalies   Assessment  Primary Diagnosis & Pertinent Problem List: The primary encounter diagnosis was Chronic bilateral low back pain with left-sided sciatica (Primary Area of Pain). Diagnoses of Chronic pain of left lower  extremity (Secondary Area of Pain), Chronic neck pain (Tertiary Area of Pain), Pain in both hands (Fourth Area of Pain), Chronic hip pain, left, Chronic pain syndrome, Long term current use of opiate analgesic, Pharmacologic therapy, Disorder of skeletal system, and Problems influencing health status were also pertinent to this visit.  Visit Diagnosis: 1. Chronic bilateral low back pain with left-sided sciatica (Primary Area of Pain)   2. Chronic pain of left lower extremity (Secondary Area of Pain)   3. Chronic neck pain (Tertiary Area of Pain)   4. Pain in both hands (Fourth Area  of Pain)   5. Chronic hip pain, left   6. Chronic pain syndrome   7. Long term current use of opiate analgesic   8. Pharmacologic therapy   9. Disorder of skeletal system   10. Problems influencing health status    Plan of Care  Initial treatment plan:  Please be advised that as per protocol, today's visit has been an evaluation only. We have not taken over the patient's controlled substance management.  Problem-specific plan: No problem-specific Assessment & Plan notes found for this encounter.  Ordered Lab-work, Procedure(s), Referral(s), & Consult(s): Orders Placed This Encounter  Procedures  . DG Cervical Spine With Flex & Extend  . DG HIP UNILAT W OR W/O PELVIS 2-3 VIEWS LEFT  . DG Hand Complete Left  . DG Hand Complete Right  . Compliance Drug Analysis, Ur  . Comp. Metabolic Panel (12)  . Magnesium  . Vitamin B12  . Sedimentation rate  . 25-Hydroxyvitamin D Lcms D2+D3  . C-reactive protein   Pharmacotherapy: Medications ordered:  No orders of the defined types were placed in this encounter.  Medications administered during this visit: Noheli Melder had no medications administered during this visit.   Pharmacotherapy under consideration:  Opioid Analgesics: The patient was informed that there is no guarantee that she would be a candidate for opioid analgesics. The decision will be made  following CDC guidelines. This decision will be based on the results of diagnostic studies, as well as Ms. Wuebker's risk profile.  PMP overdose risk assessment 690 Membrane stabilizer: To be determined at a later time Muscle relaxant: To be determined at a later time NSAID: To be determined at a later time Other analgesic(s): To be determined at a later time   Interventional therapies under consideration: Ms. Heindl was informed that there is no guarantee that she would be a candidate for interventional therapies. The decision will be based on the results of diagnostic studies, as well as Ms. Therrell's risk profile.  Possible procedure(s): Diagnostic left lumbar epidural steroid injection Diagnostic left lumbar facet nerve block Possible left lumbar facet radiofrequency ablation Diagnostic trigger point injection Diagnostic bilateral hand intra-articular  joint injections   Provider-requested follow-up: Return for 2nd Visit, w/ Dr. Dossie Arbour.  Future Appointments  Date Time Provider [place]  04/29/2018  9:30 AM Milinda Pointer, MD [facility] None    Primary Care Physician: Default, Provider, MD Location: [facility] Note by:  Date: 03/26/2018; Time: 3:01 PM  Pain Score Disclaimer: We use the NRS-11 scale. This is a self-reported, subjective measurement of pain severity with only modest accuracy. It is used primarily to identify changes within a particular patient. It must be understood that outpatient pain scales are significantly less accurate that those used for research, where they can be applied under ideal controlled circumstances with minimal exposure to variables. In reality, the score is likely to be a combination of pain intensity and pain affect, where pain affect describes the degree of emotional arousal or changes in action readiness caused by the sensory experience of pain. Factors such as social and work situation, setting, emotional state,  anxiety levels, expectation, and prior pain experience may influence pain perception and show large inter-individual differences that may also be affected by time variables.  Patient instructions provided during this appointment: Patient Instructions   ____________________________________________________________________________________________  Appointment Policy Summary  It is our goal and responsibility to provide the medical community with assistance in the evaluation and management of patients with chronic pain.  Unfortunately our resources are limited. Because we do not have an unlimited amount of time, or available appointments, we are required to closely monitor and manage their use. The following rules exist to maximize their use:  Patient's responsibilities: 1. Punctuality:  At what time should I arrive? You should be physically present in our office 30 minutes before your scheduled appointment. Your scheduled appointment is with your assigned healthcare provider. However, it takes 5-10 minutes to be "checked-in", and another 15 minutes for the nurses to do the admission. If you arrive to our office at the time you were given for your appointment, you will end up being at least 20-25 minutes late to your appointment with the provider. 2. Tardiness:  What happens if I arrive only a few minutes after my scheduled appointment time? You will need to reschedule your appointment. The cutoff is your appointment time. This is why it is so important that you arrive at least 30 minutes before that appointment. If you have an appointment scheduled for 10:00 AM and you arrive at 10:01, you will be required to reschedule your appointment.  3. Plan ahead:  Always assume that you will encounter traffic on your way in. Plan for it. If you are dependent on a driver, make sure they understand these rules and the need to arrive early. 4. Other appointments and responsibilities:  Avoid scheduling any other  appointments before or after your pain clinic appointments.  5. Be prepared:  Write down everything that you need to discuss with your healthcare provider and give this information to the admitting nurse. Write down the medications that you will need refilled. Bring your pills and bottles (even the empty ones), to all of your appointments, except for those where a procedure is scheduled. 6. No children or pets:  Find someone to take care of them. It is not appropriate to bring them in. 7. Scheduling changes:  We request "advanced notification" of any changes or cancellations. 8. Advanced notification:  Defined as a time period of more than 24 hours prior to the originally scheduled appointment. This allows for the appointment to be offered to other patients. 9. Rescheduling:  When a visit is rescheduled, it will require the cancellation of the original appointment. For this reason they both fall within the category of "Cancellations".  10. Cancellations:  They require advanced notification. Any cancellation less than 24 hours before the  appointment will be recorded as a "No Show". 11. No Show:  Defined as an unkept appointment where the patient failed to notify or declare to the practice their intention or inability to keep the appointment.  Corrective process for repeat offenders:  1. Tardiness: Three (3) episodes of rescheduling due to late arrivals will be recorded as one (1) "No Show". 2. Cancellation or reschedule: Three (3) cancellations or rescheduling will be recorded as one (1) "No Show". 3. "No Shows": Three (3) "No Shows" within a 12 month period will result in discharge from the practice. ____________________________________________________________________________________________  ____________________________________________________________________________________________  Pain Scale  Introduction: The pain score used by this practice is the Verbal Numerical Rating Scale  (VNRS-11). This is an 11-point scale. It is for adults and children 10 years or older. There are significant differences in how the pain score is reported, used, and applied. Forget everything you learned in the past and learn this scoring system.  General Information: The scale should reflect your current level of pain. Unless you are specifically asked for the level of your worst pain, or  your average pain. If you are asked for one of these two, then it should be understood that it is over the past 24 hours.  Basic Activities of Daily Living (ADL): Personal hygiene, dressing, eating, transferring, and using restroom.  Instructions: Most patients tend to report their level of pain as a combination of two factors, their physical pain and their psychosocial pain. This last one is also known as "suffering" and it is reflection of how physical pain affects you socially and psychologically. From now on, report them separately. From this point on, when asked to report your pain level, report only your physical pain. Use the following table for reference.  Pain Clinic Pain Levels (0-5/10)  Pain Level Score  Description  No Pain 0   Mild pain 1 Nagging, annoying, but does not interfere with basic activities of daily living (ADL). Patients are able to eat, bathe, get dressed, toileting (being able to get on and off the toilet and perform personal hygiene functions), transfer (move in and out of bed or a chair without assistance), and maintain continence (able to control bladder and bowel functions). Blood pressure and heart rate are unaffected. A normal heart rate for a healthy adult ranges from 60 to 100 bpm (beats per minute).   Mild to moderate pain 2 Noticeable and distracting. Impossible to hide from other people. More frequent flare-ups. Still possible to adapt and function close to normal. It can be very annoying and may have occasional stronger flare-ups. With discipline, patients may get used to it and  adapt.   Moderate pain 3 Interferes significantly with activities of daily living (ADL). It becomes difficult to feed, bathe, get dressed, get on and off the toilet or to perform personal hygiene functions. Difficult to get in and out of bed or a chair without assistance. Very distracting. With effort, it can be ignored when deeply involved in activities.   Moderately severe pain 4 Impossible to ignore for more than a few minutes. With effort, patients may still be able to manage work or participate in some social activities. Very difficult to concentrate. Signs of autonomic nervous system discharge are evident: dilated pupils (mydriasis); mild sweating (diaphoresis); sleep interference. Heart rate becomes elevated (>115 bpm). Diastolic blood pressure (lower number) rises above 100 mmHg. Patients find relief in laying down and not moving.   Severe pain 5 Intense and extremely unpleasant. Associated with frowning face and frequent crying. Pain overwhelms the senses.  Ability to do any activity or maintain social relationships becomes significantly limited. Conversation becomes difficult. Pacing back and forth is common, as getting into a comfortable position is nearly impossible. Pain wakes you up from deep sleep. Physical signs will be obvious: pupillary dilation; increased sweating; goosebumps; brisk reflexes; cold, clammy hands and feet; nausea, vomiting or dry heaves; loss of appetite; significant sleep disturbance with inability to fall asleep or to remain asleep. When persistent, significant weight loss is observed due to the complete loss of appetite and sleep deprivation.  Blood pressure and heart rate becomes significantly elevated. Caution: If elevated blood pressure triggers a pounding headache associated with blurred vision, then the patient should immediately seek attention at an urgent or emergency care unit, as these may be signs of an impending stroke.    Emergency Department Pain Levels  (6-10/10)  Emergency Room Pain 6 Severely limiting. Requires emergency care and should not be seen or managed at an outpatient pain management facility. Communication becomes difficult and requires great effort. Assistance to reach the  emergency department may be required. Facial flushing and profuse sweating along with potentially dangerous increases in heart rate and blood pressure will be evident.   Distressing pain 7 Self-care is very difficult. Assistance is required to transport, or use restroom. Assistance to reach the emergency department will be required. Tasks requiring coordination, such as bathing and getting dressed become very difficult.   Disabling pain 8 Self-care is no longer possible. At this level, pain is disabling. The individual is unable to do even the most "basic" activities such as walking, eating, bathing, dressing, transferring to a bed, or toileting. Fine motor skills are lost. It is difficult to think clearly.   Incapacitating pain 9 Pain becomes incapacitating. Thought processing is no longer possible. Difficult to remember your own name. Control of movement and coordination are lost.   The worst pain imaginable 10 At this level, most patients pass out from pain. When this level is reached, collapse of the autonomic nervous system occurs, leading to a sudden drop in blood pressure and heart rate. This in turn results in a temporary and dramatic drop in blood flow to the brain, leading to a loss of consciousness. Fainting is one of the body's self defense mechanisms. Passing out puts the brain in a calmed state and causes it to shut down for a while, in order to begin the healing process.    Summary: 1. Refer to this scale when providing [name] with your pain level. 2. Be accurate and careful when reporting your pain level. This will help with your care. 3. Over-reporting your pain level will lead to loss of credibility. 4. Even a level of 1/10 means that there is pain and  will be treated at our facility. 5. High, inaccurate reporting will be documented as "Symptom Exaggeration", leading to loss of credibility and suspicions of possible secondary gains such as obtaining more narcotics, or wanting to appear disabled, for fraudulent reasons. 6. Only pain levels of 5 or below will be seen at our facility. 7. Pain levels of 6 and above will be sent to the Emergency Department and the appointment cancelled. ____________________________________________________________________________________________

## 2018-03-26 NOTE — Progress Notes (Signed)
Safety precautions to be maintained throughout the outpatient stay will include: orient to surroundings, keep bed in low position, maintain call bell within reach at all times, provide assistance with transfer out of bed and ambulation.   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 States that she drunk her sons Dorothy Spark that was on the counter and had liquid Methadone in it.

## 2018-03-29 LAB — COMPLIANCE DRUG ANALYSIS, UR

## 2018-03-30 LAB — COMP. METABOLIC PANEL (12)
AST: 20 IU/L (ref 0–40)
Albumin/Globulin Ratio: 1.6 (ref 1.2–2.2)
Albumin: 4.2 g/dL (ref 3.6–4.8)
Alkaline Phosphatase: 131 IU/L — ABNORMAL HIGH (ref 39–117)
BUN/Creatinine Ratio: 13 (ref 12–28)
BUN: 14 mg/dL (ref 8–27)
Bilirubin Total: 0.3 mg/dL (ref 0.0–1.2)
Calcium: 9.3 mg/dL (ref 8.7–10.3)
Chloride: 99 mmol/L (ref 96–106)
Creatinine, Ser: 1.04 mg/dL — ABNORMAL HIGH (ref 0.57–1.00)
GFR calc Af Amer: 67 mL/min/{1.73_m2} (ref >59)
GFR calc non Af Amer: 59 mL/min/{1.73_m2} — ABNORMAL LOW (ref >59)
Globulin, Total: 2.6 g/dL (ref 1.5–4.5)
Glucose: 86 mg/dL (ref 65–99)
Potassium: 4.2 mmol/L (ref 3.5–5.2)
Sodium: 141 mmol/L (ref 134–144)
Total Protein: 6.8 g/dL (ref 6.0–8.5)

## 2018-03-30 LAB — 25-HYDROXY VITAMIN D LCMS D2+D3
25-Hydroxy, Vitamin D-2: <1 ng/mL
25-Hydroxy, Vitamin D-3: 51 ng/mL
25-Hydroxy, Vitamin D: 51 ng/mL

## 2018-03-30 LAB — SEDIMENTATION RATE: Sed Rate: 36 mm/hr (ref 0–40)

## 2018-03-30 LAB — VITAMIN B12: Vitamin B-12: 398 pg/mL (ref 232–1245)

## 2018-03-30 LAB — MAGNESIUM: Magnesium: 2.1 mg/dL (ref 1.6–2.3)

## 2018-03-30 LAB — C-REACTIVE PROTEIN: CRP: 5 mg/L (ref 0–10)

## 2018-04-01 ENCOUNTER — Other Ambulatory Visit: Payer: Self-pay | Admitting: Nurse Practitioner

## 2018-04-15 ENCOUNTER — Ambulatory Visit
Admission: RE | Admit: 2018-04-15 | Discharge: 2018-04-15 | Disposition: A | Discharge status: Home / Self Care | Payer: Medicare Other | Source: Ambulatory Visit | Attending: Nurse Practitioner | Admitting: Nurse Practitioner

## 2018-04-15 DIAGNOSIS — G8929 Other chronic pain: Secondary | ICD-10-CM

## 2018-04-15 DIAGNOSIS — M79642 Pain in left hand: Secondary | ICD-10-CM | POA: Insufficient documentation

## 2018-04-15 DIAGNOSIS — M25552 Pain in left hip: Secondary | ICD-10-CM | POA: Insufficient documentation

## 2018-04-15 DIAGNOSIS — M542 Cervicalgia: Secondary | ICD-10-CM | POA: Insufficient documentation

## 2018-04-15 DIAGNOSIS — M79641 Pain in right hand: Secondary | ICD-10-CM | POA: Insufficient documentation

## 2018-04-15 DIAGNOSIS — M50321 Other cervical disc degeneration at C4-C5 level: Secondary | ICD-10-CM | POA: Diagnosis not present

## 2018-04-15 DIAGNOSIS — M1288 Other specific arthropathies, not elsewhere classified, other specified site: Secondary | ICD-10-CM | POA: Diagnosis not present

## 2018-04-15 DIAGNOSIS — M19041 Primary osteoarthritis, right hand: Secondary | ICD-10-CM | POA: Diagnosis not present

## 2018-04-15 DIAGNOSIS — Z981 Arthrodesis status: Secondary | ICD-10-CM | POA: Diagnosis not present

## 2018-04-15 DIAGNOSIS — M19042 Primary osteoarthritis, left hand: Secondary | ICD-10-CM | POA: Diagnosis not present

## 2018-04-15 DIAGNOSIS — M160 Bilateral primary osteoarthritis of hip: Secondary | ICD-10-CM | POA: Insufficient documentation

## 2018-04-22 NOTE — Progress Notes (Signed)
Results were reviewed and found to be: abnormal  No acute injury or pathology identified; additional imaging may be beneficial  Review would suggest interventional pain management techniques may be of benefit

## 2018-04-22 NOTE — Progress Notes (Signed)
Results were reviewed and found to be:  abnormal  No acute injury or pathology identified; additional imaging may be beneficial  Review would suggest interventional pain management techniques may be of benefit

## 2018-04-22 NOTE — Progress Notes (Signed)
Results were reviewed and found to be: mildly abnormal  No acute injury or pathology identified  Review would suggest interventional pain management techniques may be of benefit 

## 2018-04-22 NOTE — Progress Notes (Signed)
Results were reviewed and found to be: abnormal  No acute injury or pathology identified  Review would suggest interventional pain management techniques may be of benefit

## 2018-04-28 NOTE — Progress Notes (Signed)
Patient's Name: Diana Alvarado  MRN: [id number]  Referring Provider: No ref. provider found  DOB: [birth year]  PCP: Default, Provider, MD  DOS: 04/29/2018  Note by: Gaspar Cola, MD  Service setting: Ambulatory outpatient  Specialty: Interventional Pain Management  Location: [facility] (AMB) Pain Management Facility    Patient type: Established   Primary Reason(s) for Visit: Encounter for evaluation before starting new chronic pain management plan of care (Level of risk: moderate) CC: Back Pain (low and left) and Leg Pain (posterior to arch of left side)  HPI  Diana Alvarado is a 62 y.o. year old, female patient, who comes today for a follow-up evaluation to review the test results and decide on a treatment plan. She has Asthma; Major depression; Preventative health care; Anxiety state; Cervical post-laminectomy syndrome; Cigarette nicotine dependence without complication; Attention deficit disorder; DDD (degenerative disc disease), cervical; Ganglion cyst; Generalized anxiety disorder; Impingement syndrome of shoulder region (Right); Insomnia; Personal history of drug dependence ([place]); Protrusion of cervical intervertebral disc; Splenic artery aneurysm ([place]); Chronic low back pain (Primary Area of Pain) (Bilateral) (L>R) w/ sciatica (Left); Chronic lower extremity pain (Secondary Area of Pain) (Bilateral) (L>R); Chronic neck pain (Tertiary Area of Pain) (Bilateral); Chronic hand pain (Fourth Area of Pain) (Bilateral); Chronic hip pain (Left); Chronic pain syndrome; Long term current use of opiate analgesic; Pharmacologic therapy; Disorder of skeletal system; Problems influencing health status; Chronic lumbar radicular pain (Left); Cervicalgia (Bilateral); Osteoarthritis of hands (Bilateral); Cervical fusion syndrome; History of fusion of cervical spine; 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 Her primarily concern today is the Back Pain (low and left) and Leg Pain (posterior to arch of left side)  Pain Assessment: Location: Lower Back Radiating: left leg and partial foot Onset: More than a month ago Duration: Chronic pain Quality: Aching, Throbbing, Constant, Discomfort, Sharp Severity: 3 /10 (subjective, self-reported pain score)  Note: Reported level is compatible with observation.                         When using our objective Pain Scale, levels between 6 and 10/10 are said to belong in an emergency room, as it progressively worsens from a 6/10, described as severely limiting, requiring emergency care not usually available at an outpatient pain management facility. At a 6/10 level, communication becomes difficult and requires great effort. Assistance to reach the emergency department may be required. Facial flushing and profuse sweating along with potentially dangerous increases in heart rate and blood pressure will be evident. Timing: Constant Modifying factors: heat, rest,  BP: 115/62  HR: 96  Diana Alvarado comes in today for a follow-up visit after her initial evaluation on 03/26/2018. Today we went over the results of her tests. These were explained in "[place]man's terms". During today's appointment we went over my diagnostic impression, as well as the proposed treatment plan.  According to the patient her primary area of pain is in her lower back (B) (L>R).  He admits that she has been having this pain for approximately 5 years.  Denies any precipitating factors.   She admits the pain is worse  on the left.  She denies any previous surgery. She has had an epidural steroid injection by different Ortho (Dr. Laurena Spies & Dr. Nelva Bush) which was effective for her back pain.  Last ESI about 4 months ago. She later states she had trigger point injections by Dr. Nelva Bush 5 years ago which helped a little. She has had physical therapy.  She states that it was not effective for her pain.  She had an MRI done 2018.  Her second area of pain is in her legs (L).  She admits that it goes down the left leg into the bottom of her foot (S1).  She denies any toes being involved.  She denies any numbness or tingling.  She admits that she does have weakness.  She admits that she has had a nerve conduction study many years ago in [facility].  Her third area pain is in her neck (B) (L>R). Admits to bilateral occipital H/A's w/ the L>R. She admits that she has limited range of motion.  She is s/p cervical fusion for ruptured disc 10 years ago in [place].  She admits she did have a bone graft from her left hip.  She denies any previous injections, physical therapy or recent images.  Her fourth area of pain is in her hands (B) (R>L) (Left-handed).  She admits that she was diagnosed with osteoarthritis.  She has pain and swelling.  She denies any previous surgery, interventional therapy or physical therapy.  She denies any recent images.  Today I had a rather long discussion with this patient about her medications and the methadone found on her system. She claims that she today by mistake when she took her son's daily methadone dose (150 mg per day). She also wanted me to believe that she had absolutely no side effects to this. I have a lot of difficulty believing that a "narcotic nave" 62 year old female patient can tolerate 150 mg of oral methadone without suffering any consequences. Keep reminding that 150 mg of methadone is the equivalent of 1800 mg of oral morphine. This despite  the fact that I explained this to her, she insisted that that's what happened and it was a 1 time deal. I told her that if she wanted to test this theory, we could go ahead and get another UDS today and see if there is anything in there. If it was just a one time injection of this type of medicine, then it should be completely out of her system since it happened before 03/26/2018, more than 30 days ago. Immediately after I told her this, she indicated that she had just went to the bathroom and voided and that she thought that she may not be able to give me a sample. She asked me if it would be okay for her to come back to moral and I told her that it would not be okay. She also indicated that being methadone, it lingers on your system and perhaps I would be finding some additional methadone in her system. I pointed out to her that she really had at one time intake of the medication, that by now it should be significantly decreased. What is amazing to me is that we found 14715 ng/mg of methadone, and 18351 ng/mg of EDDP in her urine, suggesting that she had already metabolized quite a bit of the methadone. Based on the recent study published in [place] (Volume 7, Number 4: 134-141), may be possible to quantify the amount  of methadone taken by dividing the amount of its metabolite EDDP (18351 ng/mg of creatinine) by the creatinine level (41 mg/dL) of the sample. This was result should be healed the amount of methadone consumed to provide that level (447.5 mg of Methadone). For this reason alone, it is very difficult for me to believe what she is telling me.  In view of this, it is my decision that I will not be including opioids in her treatment plan.  In considering the treatment plan options, Diana Alvarado was reminded that I no longer take patients for medication management only. I asked her to let me know if she had no intention of taking advantage of the interventional therapies, so that we  could make arrangements to provide this space to someone interested. I also made it clear that undergoing interventional therapies for the purpose of getting pain medications is very inappropriate on the part of a patient, and it will not be tolerated in this practice. This type of behavior would suggest true addiction and therefore it requires referral to an addiction specialist.   Further details on both, my assessment(s), as well as the proposed treatment plan, please see below.  Controlled Substance Pharmacotherapy Assessment REMS (Risk Evaluation and Mitigation Strategy)  Analgesic: None Highest recorded MME/day: 480 mg/day (PMP overdose risk assessment 690) MME/day: 0 mg/day Pill Count: None expected due to no prior prescriptions written by our practice. Hart Rochester, RN  04/29/2018  9:55 AM  Sign at close encounter Safety precautions to be maintained throughout the outpatient stay will include: orient to surroundings, keep bed in low position, maintain call bell within reach at all times, provide assistance with transfer out of bed and ambulation.    Pharmacokinetics: Liberation and absorption (onset of action): WNL Distribution (time to peak effect): WNL Metabolism and excretion (duration of action): WNL         Pharmacodynamics: Desired effects: Analgesia: Diana Alvarado reports >50% benefit. Functional ability: Patient reports that medication allows her to accomplish basic ADLs Clinically meaningful improvement in function (CMIF): Sustained CMIF goals met Perceived effectiveness: Described as relatively effective, allowing for increase in activities of daily living (ADL) Undesirable effects: Side-effects or Adverse reactions: None reported Monitoring: [place] PMP: Online review of the past 103-monthperiod previously conducted. Not applicable at this point since we have not taken over the patient's medication management yet. List of other Serum/Urine Drug Screening Test(s):  No  results found. List of all UDS test(s) done:  Lab Results  Component Value Date   SUMMARY FINAL 03/26/2018   Last UDS on record: Summary  Date Value Ref Range Status  03/26/2018 FINAL  Final    Comment:    ==================================================================== TOXASSURE COMP DRUG ANALYSIS,UR ==================================================================== Test                             Result       Flag       Units Drug Present   Amphetamine                    >24390                  ng/mg creat (from Adderall)    Amphetamine is available as a schedule II prescription drug.   Methadone                      14715  ng/mg creat   EDDP (Methadone Mtb)           18351                   ng/mg creat    Sources of methadone include scheduled prescription medications.    EDDP is an expected metabolite of methadone.   Duloxetine                     PRESENT   Aripiprazole                   PRESENT ==================================================================== Test                      Result    Flag   Units      Ref Range   Creatinine              41               mg/dL      >=20 ==================================================================== Declared Medications:  Medication list was not provided. ==================================================================== For clinical consultation, please call [phone]. ====================================================================    UDS interpretation: Unexpected findings: they urine drug screen test shows a significant amount of methadone in her system. The patient claims that she took it by accident.  According to her, her son uses 150 mg of methadone per day and she claims to have taken his medication by mistake. I have a lot of difficulty believing that she has taken 150 mg of oral methadone without experiencing any side effects, such as respiratory depression or even death. As I  said, I have a lot of difficulty believing that a "opioid nave 62 year old female can take 150 mg of methadone and not die overdose. For this reason, I do not plan on giving her any opioids. Medication Assessment Form: Patient introduced to form today Treatment compliance: Not applicable Risk Assessment Profile: Aberrant behavior: unsafe use of medication Comorbid factors increasing risk of overdose: caucasian Risk of substance use disorder (SUD): High-to-Very High  ORT Scoring interpretation table:  Score <3 = Low Risk for SUD  Score between 4-7 = Moderate Risk for SUD  Score >8 = High Risk for Opioid Abuse   Risk Mitigation Strategies:  Patient opioid safety counseling: No controlled substances prescribed. Patient-Prescriber Agreement (PPA): No agreement signed.  Controlled substance notification to other providers: None required. No opioid therapy.  Pharmacologic Plan: No opioid analgesic prescribed.             Laboratory Chemistry  Inflammation Markers (CRP: Acute Phase) (ESR: Chronic Phase) Lab Results  Component Value Date   CRP 5 03/26/2018   ESRSEDRATE 36 03/26/2018                         Rheumatology Markers No results found.  Renal Function Markers Lab Results  Component Value Date   BUN 14 03/26/2018   CREATININE 1.04 (H) 03/26/2018   BCR 13 03/26/2018   GFRAA 67 03/26/2018   GFRNONAA 59 (L) 03/26/2018                             Hepatic Function Markers Lab Results  Component Value Date   AST 20 03/26/2018   ALT 15 04/19/2012   ALBUMIN 4.2 03/26/2018   ALKPHOS 131 (H) 03/26/2018  Electrolytes Lab Results  Component Value Date   NA 141 03/26/2018   K 4.2 03/26/2018   CL 99 03/26/2018   CALCIUM 9.3 03/26/2018   MG 2.1 03/26/2018                        Neuropathy Markers Lab Results  Component Value Date   VITAMINB12 398 03/26/2018                        Bone Pathology Markers Lab Results  Component Value Date    25OHVITD1 51 03/26/2018   25OHVITD2 <1.0 03/26/2018   25OHVITD3 51 03/26/2018                         Coagulation Parameters Lab Results  Component Value Date   PLT 170.0 04/19/2012                        Cardiovascular Markers Lab Results  Component Value Date   HGB 13.4 04/19/2012   HCT 40.1 04/19/2012                         CA Markers No results found.  Note: Lab results reviewed.  Recent Diagnostic Imaging Review  Cervical Imaging: Cervical DG Bending/F/E views:  Results for orders placed during the hospital encounter of 04/15/18  DG Cervical Spine With Flex & Extend   Narrative CLINICAL DATA:  Chronic neck pain.  EXAM: CERVICAL SPINE COMPLETE WITH FLEXION AND EXTENSION VIEWS  COMPARISON:  None.  FINDINGS: The lateral view is diagnostic to the C6 level. Solid osseous fusion from C5-C7. There is no acute fracture or subluxation. Vertebral body heights are preserved. There is 2 mm anterolisthesis at C3-C4 and 3 mm anterolisthesis at C4-C5, minimally increased during flexion, and reduced during extension. Mild disc height loss and facet uncovertebral hypertrophy at C4-C5. Moderate facet arthropathy at C2-C3 and C3-C4. Minimal neuroforaminal stenosis on the right at C3-C4 and C4-C5.Normal prevertebral soft tissues.  IMPRESSION: 1. Grade 1 anterolisthesis at C3-C4 and C4-C5, minimally increased during flexion, and reduced during extension. 2. Solid osseous fusion from C5-C7 with mild adjacent segment degenerative disc disease at C4-C5. 3. Moderate facet arthropathy throughout the cervical spine.   Electronically Signed   By: Titus Dubin M.D.   On: 04/15/2018 14:54    Lumbosacral Imaging: Lumbar DG Epidurogram OP:  Results for orders placed in visit on 12/13/00  DG Epidurography   Narrative FINDINGS CLINICAL DATA: LOW LEFT BACK PAIN.   DR. Nelva Bush REQUESTS LUMBAR EPIDURAL AND L4-5 AND L5-S1 FACET INJECTIONS LEFT L4-5 FACET INJECTION: A CURVED 22  GAUGE SPINAL NEEDLE WAS DIRECTED INTO THE SUPERIOR RECESS OF THE L4-5 FACET JOINT ON THE LEFT.   INTRA-ARTICULAR POSITIONING WAS CONFIRMED BY INJECTING A SMALL AMOUNT OF OMNIPAQUE 180.   40 MG DEPO-MEDROL AND 1 CC OF 0.5% BUPIVACAINE WERE INSTILLED INTO THE JOINT.  THE INJECTION RESULTED IN A CONCORDANT PAIN. IMPRESSION TECHNICALLY SUCCESSFUL LEFT L4-5 FACET INJECTION.   CONCORDANT PAIN DURING THE INJECTION. LEFT L5-S1 FACET INJECTION: A SIMILAR PROCEDURE WAS PERFORMED USING A CURVED 22 GAUGE SPINAL NEEDLE.   INTERARTICULAR POSITIONING WAS AGAIN CONFIRMED BY INJECTING A SMALL AMOUNT OF OMNIPAQUE 180.   40 MG DEPO-MEDROL AND 1 CC OF 0.5% BUPIVACAINE WERE INSTILLED INTO THE JOINT.   THIS INJECTION DID RESULT IN SOME FAMILIAR PAIN, BUT NOT AS MUCH  AS THE L4-5 LEVEL. IMPRESSION: TECHNICALLY SUCCESSFUL L5-S1 FACET INJECTION WITH SOME CONCORDANT PAIN ALTHOUGH LESS THAN SEEN AT THE L4-5 LEVEL. LEFT L5-S1 INTERLAMINAR EPIDURAL: A POSTERIOR APPROACH WAS TAKEN ON THE LEFT AT L5-S1.  A 20 GAUGE CRAWFORD EPIDURAL NEEDLE WAS ADVANCED USING LOSS-OF-RESISTANCE TECHNIQUE. DIAGNOSTIC EPIDURAL INJECTION: INJECTION OF OMNIPAQUE 180 SHOWS A GOOD EPIDURAL PATTERN WITH SPREAD ABOVE AND BELOW  LEVEL OF NEEDLE PLACEMENT, RESTRICTED TO THE LEFT. THERAPEUTIC EPIDURAL INJECTION: 40 MG OF DEPO-MEDROL MIXED WITH 5 CC 1% LIDOCAINE WERE THEN INSTILLED.  THE PROCEDURE WAS WELL- TOLERATED AND THE PATIENT WAS DISCHARGED 30 MINUTES FOLLOWING THE INJECTION IN GOOD CONDITION FEELING CONSIDERABLE RELIEF.  IT IS NOT POSSIBLE TO TELL WHETHER HER RELIEF IS COMING FROM THE EPIDURAL OR THE FACET INJECTIONS AT THIS POINT. IMPRESSION: TECHNICALLY SUCCESSFUL INITIAL LUMBAR EPIDURAL ON THE LEFT AT L5-S1 AS DESCRIBED.   Spine Imaging: CT-Guided Biopsy:  Results for orders placed during the hospital encounter of 05/25/17  CT BIOPSY   Narrative INDICATION: Suspected LEFT SI joint dysfunction.  EXAM: CT-GUIDED LEFT SI JOINT  INJECTION ANESTHETIC ONLY.  MEDICATIONS: Described below.  ANESTHESIA/SEDATION: No conscious sedation was employed.  FLUOROSCOPY TIME:  Estimated CT DI of 20 mGy.  COMPLICATIONS: None immediate.  PROCEDURE: Informed written consent was obtained from the patient after a thorough discussion of the procedural risks, benefits and alternatives. All questions were addressed. Maximal Sterile Barrier Technique was utilized including mask, sterile gloves, sterile drape, hand hygiene and skin antiseptic. A timeout was performed prior to the initiation of the procedure.  An appropriate skin entry site was chosen and anesthetized with 1% lidocaine. A 20 gauge spinal needle was placed in the LEFT SI joint from a posterior approach. Intraarticular positioning was confirmed with a small amount of Isovue-M 200. Subsequently, the joint was anesthetized with 2.5 mL of 0.5% Sensorcaine. Postprocedure the patient reports she is pain-free from her typical pain at home.  IMPRESSION: Technically successful LEFT sacroiliac joint injection under CT guidance, anesthetic only. Patient reports she is currently pain-free.   Electronically Signed   By: Staci Righter M.D.   On: 05/25/2017 15:21    Epidurography 1:  Results for orders placed in visit on 12/13/00  DG Epidurography   Narrative FINDINGS CLINICAL DATA: LOW LEFT BACK PAIN.   DR. Nelva Bush REQUESTS LUMBAR EPIDURAL AND L4-5 AND L5-S1 FACET INJECTIONS LEFT L4-5 FACET INJECTION: A CURVED 22 GAUGE SPINAL NEEDLE WAS DIRECTED INTO THE SUPERIOR RECESS OF THE L4-5 FACET JOINT ON THE LEFT.   INTRA-ARTICULAR POSITIONING WAS CONFIRMED BY INJECTING A SMALL AMOUNT OF OMNIPAQUE 180.   40 MG DEPO-MEDROL AND 1 CC OF 0.5% BUPIVACAINE WERE INSTILLED INTO THE JOINT.  THE INJECTION RESULTED IN A CONCORDANT PAIN. IMPRESSION TECHNICALLY SUCCESSFUL LEFT L4-5 FACET INJECTION.   CONCORDANT PAIN DURING THE INJECTION. LEFT L5-S1 FACET INJECTION: A SIMILAR PROCEDURE  WAS PERFORMED USING A CURVED 22 GAUGE SPINAL NEEDLE.   INTERARTICULAR POSITIONING WAS AGAIN CONFIRMED BY INJECTING A SMALL AMOUNT OF OMNIPAQUE 180.   40 MG DEPO-MEDROL AND 1 CC OF 0.5% BUPIVACAINE WERE INSTILLED INTO THE JOINT.   THIS INJECTION DID RESULT IN SOME FAMILIAR PAIN, BUT NOT AS MUCH AS THE L4-5 LEVEL. IMPRESSION: TECHNICALLY SUCCESSFUL L5-S1 FACET INJECTION WITH SOME CONCORDANT PAIN ALTHOUGH LESS THAN SEEN AT THE L4-5 LEVEL. LEFT L5-S1 INTERLAMINAR EPIDURAL: A POSTERIOR APPROACH WAS TAKEN ON THE LEFT AT L5-S1.  A 20 GAUGE CRAWFORD EPIDURAL NEEDLE WAS ADVANCED USING LOSS-OF-RESISTANCE TECHNIQUE. DIAGNOSTIC EPIDURAL INJECTION: INJECTION OF OMNIPAQUE 180 SHOWS A GOOD EPIDURAL  PATTERN WITH SPREAD ABOVE AND BELOW  LEVEL OF NEEDLE PLACEMENT, RESTRICTED TO THE LEFT. THERAPEUTIC EPIDURAL INJECTION: 40 MG OF DEPO-MEDROL MIXED WITH 5 CC 1% LIDOCAINE WERE THEN INSTILLED.  THE PROCEDURE WAS WELL- TOLERATED AND THE PATIENT WAS DISCHARGED 30 MINUTES FOLLOWING THE INJECTION IN GOOD CONDITION FEELING CONSIDERABLE RELIEF.  IT IS NOT POSSIBLE TO TELL WHETHER HER RELIEF IS COMING FROM THE EPIDURAL OR THE FACET INJECTIONS AT THIS POINT. IMPRESSION: TECHNICALLY SUCCESSFUL INITIAL LUMBAR EPIDURAL ON THE LEFT AT L5-S1 AS DESCRIBED.   Hip Imaging: Hip-L DG 2-3 views:  Results for orders placed during the hospital encounter of 04/15/18  DG HIP UNILAT W OR W/O PELVIS 2-3 VIEWS LEFT   Narrative CLINICAL DATA:  Chronic left hip pain.  EXAM: DG HIP (WITH OR WITHOUT PELVIS) 2-3V LEFT  COMPARISON:  None.  FINDINGS: No acute fracture or dislocation. The pubic symphysis and sacroiliac joints are unremarkable. Mild bilateral hip joint space narrowing, slightly worse on the right, with small marginal osteophytes. Moderate bilateral facet arthropathy at L4-L5 and L5-S1. Soft tissues are unremarkable.  IMPRESSION: 1. Mild bilateral hip osteoarthritis.   Electronically Signed   By: Titus Dubin  M.D.   On: 04/15/2018 14:55    Foot Imaging: Foot-L DG Complete:  Results for orders placed during the hospital encounter of 02/10/10  DG Foot Complete Left   Narrative Clinical Data: Foot pain and swelling following injury today.   LEFT FOOT - COMPLETE 3+ VIEW   Comparison: None.   Findings: There is a high arch.  Mineralization and alignment are normal.  There is no evidence of acute fracture or dislocation. The lateral sesamoid of the first metatarsal appears bipartite.  No acute soft tissue findings are evident.   IMPRESSION: No acute osseous findings.  Provider: Marcelino Scot   Complexity Note: Imaging results reviewed. Results shared with Diana Alvarado, using [employer].                         Meds   Current Outpatient Medications:  .  acetaminophen (TYLENOL) 500 MG tablet, Take 500 mg by mouth every 6 (six) hours as needed., Disp: , Rfl:  .  albuterol (PROVENTIL HFA;VENTOLIN HFA) 108 (90 BASE) MCG/ACT inhaler, Inhale 2 puffs into the lungs every 6 (six) hours as needed for wheezing or shortness of breath., Disp: , Rfl:  .  amphetamine-dextroamphetamine (ADDERALL) 30 MG tablet, Take 30 mg by mouth 2 (two) times daily., Disp: , Rfl:  .  ARIPiprazole (ABILIFY) 15 MG tablet, Take 10 mg by mouth daily. , Disp: , Rfl:  .  cyclobenzaprine (FLEXERIL) 10 MG tablet, TAKE 1 TABLET BY MOUTH EVERY 8 HOURS AS NEEDED FOR PAIN/SPASM, Disp: , Rfl: 0 .  DULoxetine (CYMBALTA) 60 MG capsule, Take 60 mg by mouth daily., Disp: , Rfl:  .  traZODone (DESYREL) 100 MG tablet, Take 200 mg by mouth at bedtime., Disp: , Rfl:  .  zolpidem (AMBIEN) 10 MG tablet, Take 10 mg by mouth at bedtime as needed for sleep. , Disp: , Rfl:   ROS  Constitutional: Denies any fever or chills Gastrointestinal: No reported hemesis, hematochezia, vomiting, or acute GI distress Musculoskeletal: Denies any acute onset joint swelling, redness, loss of ROM, or weakness Neurological: No reported episodes of acute onset  apraxia, aphasia, dysarthria, agnosia, amnesia, paralysis, loss of coordination, or loss of consciousness  Allergies  Diana Alvarado is allergic to aspirin; codeine; and sulfonamide derivatives.  [place]  Drug: Diana Alvarado  reports that she does not use drugs. Alcohol:  reports that she does not drink alcohol. Tobacco:  reports that she has been smoking cigarettes. She has a 10.00 pack-year smoking history. She has never used smokeless tobacco. Medical:  has a past medical history of ADHD, Anxiety (11/17/2011), Asthma (04/19/2011), Chronic LBP (04/19/2011), Depression (04/19/2011), DJD (degenerative joint disease), cervical (04/19/2011), Lumbar disc disease (04/19/2011), and UTI (lower urinary tract infection). Surgical: Diana Alvarado  has a past surgical history that includes gall bladder (2005); Abdominal hysterectomy (1997); ruptured disc (1992); and Neck surgery (1995). Family: family history includes Arthritis in her other; Cancer in her other; Colon cancer in her maternal aunt; Diabetes in her other.  Constitutional Exam  General appearance: Well nourished, well developed, and well hydrated. In no apparent acute distress Vitals:   04/29/18 0951  BP: 115/62  Pulse: 96  Resp: 18  Temp: 98 F (36.7 C)  TempSrc: Oral  SpO2: 100%  Weight: 150 lb (68 kg)  Height: 5' 1" (1.549 m)   BMI Assessment: Estimated body mass index is 28.34 kg/m as calculated from the following:   Height as of this encounter: 5' 1" (1.549 m).   Weight as of this encounter: 150 lb (68 kg).  BMI interpretation table: BMI level Category Range association with higher incidence of chronic pain  <18 kg/m2 Underweight   18.5-24.9 kg/m2 Ideal body weight   25-29.9 kg/m2 Overweight Increased incidence by 20%  30-34.9 kg/m2 Obese (Class I) Increased incidence by 68%  35-39.9 kg/m2 Severe obesity (Class II) Increased incidence by 136%  >40 kg/m2 Extreme obesity (Class III) Increased incidence by 254%   Patient's current BMI Ideal Body  weight  Body mass index is 28.34 kg/m. Ideal body weight: 47.8 kg (105 lb 6.1 oz) Adjusted ideal body weight: 55.9 kg (123 lb 3.7 oz)  This patient's body surface area is 1.71 meters squared.  BMI Readings from Last 4 Encounters:  04/29/18 28.34 kg/m  03/26/18 28.34 kg/m  12/02/12 24.56 kg/m  06/05/12 24.75 kg/m   Wt Readings from Last 4 Encounters:  04/29/18 150 lb (68 kg)  03/26/18 150 lb (68 kg)  12/02/12 130 lb (59 kg)  06/05/12 131 lb (59.4 kg)  Psych/Mental status: Alert, oriented x 3 (person, place, & time)       Eyes: PERLA Respiratory: No evidence of acute respiratory distress  Cervical Spine Area Exam  Skin & Axial Inspection: Well healed scar from previous spine surgery detected Alignment: Symmetrical Functional ROM: Unrestricted ROM      Stability: No instability detected Muscle Tone/Strength: Functionally intact. No obvious neuro-muscular anomalies detected. Sensory (Neurological): Unimpaired Palpation: No palpable anomalies              Upper Extremity (UE) Exam    Side: Right upper extremity  Side: Left upper extremity  Skin & Extremity Inspection: Skin color, temperature, and hair growth are WNL. No peripheral edema or cyanosis. No masses, redness, swelling, asymmetry, or associated skin lesions. No contractures.  Skin & Extremity Inspection: Skin color, temperature, and hair growth are WNL. No peripheral edema or cyanosis. No masses, redness, swelling, asymmetry, or associated skin lesions. No contractures.  Functional ROM: Unrestricted ROM          Functional ROM: Unrestricted ROM          Muscle Tone/Strength: Functionally intact. No obvious neuro-muscular anomalies detected.  Muscle Tone/Strength: Functionally intact. No obvious neuro-muscular anomalies detected.  Sensory (Neurological): Unimpaired  Sensory (Neurological): Unimpaired          Palpation: No palpable anomalies              Palpation: No palpable anomalies              Provocative  Test(s):  Phalen's test: deferred Tinel's test: deferred Apley's scratch test (touch opposite shoulder):  Action 1 (Across chest): deferred Action 2 (Overhead): deferred Action 3 (LB reach): deferred   Provocative Test(s):  Phalen's test: deferred Tinel's test: deferred Apley's scratch test (touch opposite shoulder):  Action 1 (Across chest): deferred Action 2 (Overhead): deferred Action 3 (LB reach): deferred    Thoracic Spine Area Exam  Skin & Axial Inspection: No masses, redness, or swelling Alignment: Symmetrical Functional ROM: Unrestricted ROM Stability: No instability detected Muscle Tone/Strength: Functionally intact. No obvious neuro-muscular anomalies detected. Sensory (Neurological): Unimpaired Muscle strength & Tone: No palpable anomalies  Lumbar Spine Area Exam  Skin & Axial Inspection: No masses, redness, or swelling Alignment: Symmetrical Functional ROM: Unrestricted ROM       Stability: No instability detected Muscle Tone/Strength: Functionally intact. No obvious neuro-muscular anomalies detected. Sensory (Neurological): Unimpaired Palpation: No palpable anomalies       Provocative Tests: Hyperextension/rotation test: deferred today       Lumbar quadrant test (Kemp's test): deferred today       Lateral bending test: deferred today       Patrick's Maneuver: deferred today                   FABER test: deferred today                   S-I anterior distraction/compression test: deferred today         S-I lateral compression test: deferred today         S-I Thigh-thrust test: deferred today         S-I Gaenslen's test: deferred today          Gait & Posture Assessment  Ambulation: Unassisted Gait: Relatively normal for age and body habitus Posture: WNL   Lower Extremity Exam    Side: Right lower extremity  Side: Left lower extremity  Stability: No instability observed          Stability: No instability observed          Skin & Extremity Inspection: Skin  color, temperature, and hair growth are WNL. No peripheral edema or cyanosis. No masses, redness, swelling, asymmetry, or associated skin lesions. No contractures.  Skin & Extremity Inspection: Skin color, temperature, and hair growth are WNL. No peripheral edema or cyanosis. No masses, redness, swelling, asymmetry, or associated skin lesions. No contractures.  Functional ROM: Unrestricted ROM                  Functional ROM: Unrestricted ROM                  Muscle Tone/Strength: Functionally intact. No obvious neuro-muscular anomalies detected.  Muscle Tone/Strength: Functionally intact. No obvious neuro-muscular anomalies detected.  Sensory (Neurological): Unimpaired  Sensory (Neurological): Unimpaired  Palpation: No palpable anomalies  Palpation: No palpable anomalies   Assessment & Plan  Primary Diagnosis & Pertinent Problem List: The primary encounter diagnosis was Chronic pain syndrome. Diagnoses of Chronic low back pain (Primary Area of Pain) (Bilateral) (L>R) w/ sciatica (Left), DDD (degenerative disc disease), lumbar, Lumbar facet arthropathy (Bilateral), Lumbar facet syndrome (Bilateral) (L>R), Spondylosis without myelopathy or radiculopathy, lumbosacral region, Chronic sacroiliac  joint pain (Left), Sacroiliac joint dysfunction (Left), Other specified dorsopathies, sacral and sacrococcygeal region, Chronic lower extremity pain (Secondary Area of Pain) (Bilateral) (L>R), Chronic lumbar radicular pain (Left), Chronic hip pain (Left), Osteoarthritis of hips (Bilateral), Chronic neck pain (Tertiary Area of Pain) (Bilateral), Cervicalgia (Bilateral), History of fusion of cervical spine, Cervical post-laminectomy syndrome, Cervical fusion syndrome, Cervical facet arthropathy (Bilateral), Cervical facet syndrome (Bilateral), DDD (degenerative disc disease), cervical, Protrusion of cervical intervertebral disc, Impingement syndrome of shoulder region (Right), Chronic hand pain (Fourth Area of Pain)  (Bilateral), Osteoarthritis of hands (Bilateral), Chronic myofascial pain, Neurogenic pain, Attention deficit hyperactivity disorder (ADHD), unspecified ADHD type, 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  Visit Diagnosis: 1. Chronic pain syndrome   2. Chronic low back pain (Primary Area of Pain) (Bilateral) (L>R) w/ sciatica (Left)   3. DDD (degenerative disc disease), lumbar   4. Lumbar facet arthropathy (Bilateral)   5. Lumbar facet syndrome (Bilateral) (L>R)   6. Spondylosis without myelopathy or radiculopathy, lumbosacral region   7. Chronic sacroiliac joint pain (Left)   8. Sacroiliac joint dysfunction (Left)   9. Other specified dorsopathies, sacral and sacrococcygeal region   10. Chronic lower extremity pain (Secondary Area of Pain) (Bilateral) (L>R)   11. Chronic lumbar radicular pain (Left)   12. Chronic hip pain (Left)   13. Osteoarthritis of hips (Bilateral)   14. Chronic neck pain (Tertiary Area of Pain) (Bilateral)   15. Cervicalgia (Bilateral)   16. History of fusion of cervical spine   17. Cervical post-laminectomy syndrome   18. Cervical fusion syndrome   19. Cervical facet arthropathy (Bilateral)   20. Cervical facet syndrome (Bilateral)   21. DDD (degenerative disc disease), cervical   22. Protrusion of cervical intervertebral disc   23. Impingement syndrome of shoulder region (Right)   24. Chronic hand pain (Fourth Area of Pain) (Bilateral)   25. Osteoarthritis of hands (Bilateral)   26. Chronic myofascial pain   27. Neurogenic pain   28. Attention deficit hyperactivity disorder (ADHD), unspecified ADHD type   29. 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 Disorder of skeletal  system   31. Pharmacologic therapy   32. Long term current use of opiate analgesic   33. 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 History of substance use disorder   35. Cigarette nicotine dependence without complication   36. Personal history of drug dependence ([place])   37. Problems influencing health status    Problems updated and reviewed during this visit: Problem  Chronic lumbar radicular pain (Left)  Cervicalgia (Bilateral)  Osteoarthritis of hands (Bilateral)  Cervical Fusion Syndrome  History of Fusion of Cervical Spine  Cervical facet arthropathy (Bilateral)  Cervical facet syndrome (Bilateral)  Sacroiliac joint dysfunction (Left)  Chronic sacroiliac joint pain (Left)  Lumbar facet syndrome (Bilateral) (L>R)  Lumbar facet arthropathy (Bilateral)  Spondylosis Without Myelopathy Or Radiculopathy, Lumbosacral Region  Other Specified Dorsopathies, Sacral and Sacrococcygeal Region  Ddd (Degenerative Disc Disease), Lumbar  Osteoarthritis of hips (Bilateral)  Chronic Myofascial Pain  Neurogenic Pain  Chronic low back pain (Primary Area of Pain) (Bilateral) (L>R) w/ sciatica (Left)  Chronic lower extremity pain (Secondary Area of Pain) (Bilateral) (L>R)  Chronic neck pain (Tertiary Area of Pain) (Bilateral)  Chronic hand pain (Fourth Area of Pain) (Bilateral)  Chronic hip pain (Left)  Impingement syndrome of shoulder region (Right)  Cervical Post-Laminectomy Syndrome  Ddd (Degenerative Disc Disease), Cervical  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[facility])  Methadone Use ([facility])  History of Substance Use Disorder  Long Term Current Use of Opiate Analgesic  Personal History of Drug Dependence ([facility])   Previously on chronic pain medication for back pain, no longer on pain medications    Time Note: Greater than 50% of the 40 minute(s) of face-to-face time spent with Diana Alvarado, was spent in counseling/coordination of care regarding: the appropriate use of the pain scale, Diana Alvarado's primary cause of pain, the results of  her recent test(s), the significance of each one oth the test(s) anomalies and it's corresponding characteristic pain pattern(s), the treatment plan, treatment alternatives, the risks and possible complications of proposed treatment, medication side effects, the opioid analgesic risks and possible complications, realistic expectations, the goals of pain management (increased in functionality) and the need to collect and read the AVS material.  Plan of Care  Pharmacotherapy (Medications Ordered): No orders of the defined types were placed in this encounter.   Procedure Orders     LUMBAR FACET(MEDIAL BRANCH NERVE BLOCK) MBNB  Lab Orders     ToxASSURE Select 13 (MW), Urine Imaging Orders  No imaging studies ordered today   Referral Orders  No referral(s) requested today    Pharmacological management options:  Opioid Analgesics: I will not be prescribing any opioids at this time Membrane stabilizer: We have discussed the possibility of optimizing this mode of therapy, if tolerated Muscle relaxant: We have discussed the possibility of a trial NSAID: We have discussed the possibility of a trial Other analgesic(s): To be determined at a later time   Interventional management options: Planned, scheduled, and/or pending:    Diagnostic bilateral lumbar facet block under fluoroscopic guidance and IV sedation. Repeat UDS today.   Considering:   Diagnostic left L4-5 interlaminar LESI  Diagnostic left sided L4 transforaminal LESI  Diagnostic left lumbar facet block  Possible left lumbar facet RFA  Diagnostic left sided sacroiliac joint block  Possible left-sided sacroiliac joint RFA  Diagnostic left sided intra-articular hip joint injection  Diagnostic left sided femoral nerve + obturator nerve block  Possible left-sided femoral nerve + obturator nerve RFA  Diagnostic (Midline) cervical epidural steroid injection  Diagnostic bilateral cervical facet block  Possible bilateral cervical  facet RFA  Diagnostic trigger point injection  Diagnostic bilateral hand intra-articular  joint injections    PRN Procedures:   None at this time   Provider-requested follow-up: Return for PRN Procedure (w/ sedation): (B) L-FCT BLK #1.  Future Appointments  Date Time Provider [place]  05/16/2018  9:45 AM Milinda Pointer, MD [facility] None    Primary Care Physician: Default, Provider, MD Location: [facility] Note by: Gaspar Cola, MD Date: 04/29/2018; Time: 5:11 PM

## 2018-04-29 ENCOUNTER — Ambulatory Visit: Payer: Medicare Other | Attending: Pain Medicine | Admitting: Pain Medicine

## 2018-04-29 ENCOUNTER — Encounter: Payer: Self-pay | Admitting: Pain Medicine

## 2018-04-29 ENCOUNTER — Other Ambulatory Visit: Payer: Self-pay

## 2018-04-29 VITALS — BP 115/62 | HR 96 | Temp 98.0°F | Resp 18 | Ht 61.0 in | Wt 150.0 lb

## 2018-04-29 DIAGNOSIS — M25552 Pain in left hip: Secondary | ICD-10-CM | POA: Diagnosis not present

## 2018-04-29 DIAGNOSIS — J45909 Unspecified asthma, uncomplicated: Secondary | ICD-10-CM | POA: Insufficient documentation

## 2018-04-29 DIAGNOSIS — G894 Chronic pain syndrome: Secondary | ICD-10-CM

## 2018-04-29 DIAGNOSIS — F329 Major depressive disorder, single episode, unspecified: Secondary | ICD-10-CM | POA: Diagnosis not present

## 2018-04-29 DIAGNOSIS — M899 Disorder of bone, unspecified: Secondary | ICD-10-CM

## 2018-04-29 DIAGNOSIS — G8929 Other chronic pain: Secondary | ICD-10-CM

## 2018-04-29 DIAGNOSIS — M5020 Other cervical disc displacement, unspecified cervical region: Secondary | ICD-10-CM | POA: Diagnosis not present

## 2018-04-29 DIAGNOSIS — M792 Neuralgia and neuritis, unspecified: Secondary | ICD-10-CM

## 2018-04-29 DIAGNOSIS — Z886 Allergy status to analgesic agent status: Secondary | ICD-10-CM | POA: Insufficient documentation

## 2018-04-29 DIAGNOSIS — Z79891 Long term (current) use of opiate analgesic: Secondary | ICD-10-CM

## 2018-04-29 DIAGNOSIS — F909 Attention-deficit hyperactivity disorder, unspecified type: Secondary | ICD-10-CM | POA: Diagnosis not present

## 2018-04-29 DIAGNOSIS — F411 Generalized anxiety disorder: Secondary | ICD-10-CM | POA: Diagnosis not present

## 2018-04-29 DIAGNOSIS — M47816 Spondylosis without myelopathy or radiculopathy, lumbar region: Secondary | ICD-10-CM | POA: Diagnosis not present

## 2018-04-29 DIAGNOSIS — F1590 Other stimulant use, unspecified, uncomplicated: Secondary | ICD-10-CM | POA: Insufficient documentation

## 2018-04-29 DIAGNOSIS — M47817 Spondylosis without myelopathy or radiculopathy, lumbosacral region: Secondary | ICD-10-CM

## 2018-04-29 DIAGNOSIS — Z981 Arthrodesis status: Secondary | ICD-10-CM

## 2018-04-29 DIAGNOSIS — M160 Bilateral primary osteoarthritis of hip: Secondary | ICD-10-CM

## 2018-04-29 DIAGNOSIS — Q761 Klippel-Feil syndrome: Secondary | ICD-10-CM | POA: Insufficient documentation

## 2018-04-29 DIAGNOSIS — I728 Aneurysm of other specified arteries: Secondary | ICD-10-CM | POA: Insufficient documentation

## 2018-04-29 DIAGNOSIS — Z87440 Personal history of urinary (tract) infections: Secondary | ICD-10-CM | POA: Insufficient documentation

## 2018-04-29 DIAGNOSIS — M5030 Other cervical disc degeneration, unspecified cervical region: Secondary | ICD-10-CM | POA: Diagnosis not present

## 2018-04-29 DIAGNOSIS — M19041 Primary osteoarthritis, right hand: Secondary | ICD-10-CM | POA: Diagnosis not present

## 2018-04-29 DIAGNOSIS — M545 Low back pain: Secondary | ICD-10-CM | POA: Diagnosis present

## 2018-04-29 DIAGNOSIS — M51369 Other intervertebral disc degeneration, lumbar region without mention of lumbar back pain or lower extremity pain: Secondary | ICD-10-CM

## 2018-04-29 DIAGNOSIS — M5416 Radiculopathy, lumbar region: Secondary | ICD-10-CM

## 2018-04-29 DIAGNOSIS — F17210 Nicotine dependence, cigarettes, uncomplicated: Secondary | ICD-10-CM

## 2018-04-29 DIAGNOSIS — M7918 Myalgia, other site: Secondary | ICD-10-CM

## 2018-04-29 DIAGNOSIS — Z885 Allergy status to narcotic agent status: Secondary | ICD-10-CM | POA: Insufficient documentation

## 2018-04-29 DIAGNOSIS — G4700 Insomnia, unspecified: Secondary | ICD-10-CM | POA: Insufficient documentation

## 2018-04-29 DIAGNOSIS — M19042 Primary osteoarthritis, left hand: Secondary | ICD-10-CM | POA: Diagnosis not present

## 2018-04-29 DIAGNOSIS — M5116 Intervertebral disc disorders with radiculopathy, lumbar region: Secondary | ICD-10-CM | POA: Diagnosis not present

## 2018-04-29 DIAGNOSIS — M79605 Pain in left leg: Secondary | ICD-10-CM

## 2018-04-29 DIAGNOSIS — M7541 Impingement syndrome of right shoulder: Secondary | ICD-10-CM

## 2018-04-29 DIAGNOSIS — M5136 Other intervertebral disc degeneration, lumbar region: Secondary | ICD-10-CM | POA: Diagnosis not present

## 2018-04-29 DIAGNOSIS — M542 Cervicalgia: Secondary | ICD-10-CM

## 2018-04-29 DIAGNOSIS — M79642 Pain in left hand: Secondary | ICD-10-CM

## 2018-04-29 DIAGNOSIS — Z90710 Acquired absence of both cervix and uterus: Secondary | ICD-10-CM | POA: Insufficient documentation

## 2018-04-29 DIAGNOSIS — F1921 Other psychoactive substance dependence, in remission: Secondary | ICD-10-CM

## 2018-04-29 DIAGNOSIS — F1120 Opioid dependence, uncomplicated: Secondary | ICD-10-CM

## 2018-04-29 DIAGNOSIS — Z79899 Other long term (current) drug therapy: Secondary | ICD-10-CM | POA: Diagnosis not present

## 2018-04-29 DIAGNOSIS — F1190 Opioid use, unspecified, uncomplicated: Secondary | ICD-10-CM | POA: Insufficient documentation

## 2018-04-29 DIAGNOSIS — M533 Sacrococcygeal disorders, not elsewhere classified: Secondary | ICD-10-CM | POA: Insufficient documentation

## 2018-04-29 DIAGNOSIS — M961 Postlaminectomy syndrome, not elsewhere classified: Secondary | ICD-10-CM

## 2018-04-29 DIAGNOSIS — M79641 Pain in right hand: Secondary | ICD-10-CM

## 2018-04-29 DIAGNOSIS — F1510 Other stimulant abuse, uncomplicated: Secondary | ICD-10-CM

## 2018-04-29 DIAGNOSIS — Z87898 Personal history of other specified conditions: Secondary | ICD-10-CM

## 2018-04-29 DIAGNOSIS — M5442 Lumbago with sciatica, left side: Secondary | ICD-10-CM

## 2018-04-29 DIAGNOSIS — M79604 Pain in right leg: Secondary | ICD-10-CM

## 2018-04-29 DIAGNOSIS — M5388 Other specified dorsopathies, sacral and sacrococcygeal region: Secondary | ICD-10-CM

## 2018-04-29 DIAGNOSIS — Z882 Allergy status to sulfonamides status: Secondary | ICD-10-CM | POA: Insufficient documentation

## 2018-04-29 DIAGNOSIS — Z789 Other specified health status: Secondary | ICD-10-CM

## 2018-04-29 DIAGNOSIS — M47812 Spondylosis without myelopathy or radiculopathy, cervical region: Secondary | ICD-10-CM

## 2018-04-29 NOTE — Progress Notes (Signed)
Safety precautions to be maintained throughout the outpatient stay will include: orient to surroundings, keep bed in low position, maintain call bell within reach at all times, provide assistance with transfer out of bed and ambulation.  

## 2018-04-29 NOTE — Patient Instructions (Addendum)
____________________________________________________________________________________________  Preparing for Procedure with Sedation  Instructions: . Oral Intake: Do not eat or drink anything for at least 8 hours prior to your procedure. . Transportation: Public transportation is not allowed. Bring an adult driver. The driver must be physically present in our waiting room before any procedure can be started. . Physical Assistance: Bring an adult physically capable of assisting you, in the event you need help. This adult should keep you company at home for at least 6 hours after the procedure. . Blood Pressure Medicine: Take your blood pressure medicine with a sip of water the morning of the procedure. . Blood thinners: Notify our staff if you are taking any blood thinners. Depending on which one you take, there will be specific instructions on how and when to stop it. . Diabetics on insulin: Notify the staff so that you can be scheduled 1st case in the morning. If your diabetes requires high dose insulin, take only  of your normal insulin dose the morning of the procedure and notify the staff that you have done so. . Preventing infections: Shower with an antibacterial soap the morning of your procedure. . Build-up your immune system: Take 1000 mg of Vitamin C with every meal (3 times a day) the day prior to your procedure. . Antibiotics: Inform the staff if you have a condition or reason that requires you to take antibiotics before dental procedures. . Pregnancy: If you are pregnant, call and cancel the procedure. . Sickness: If you have a cold, fever, or any active infections, call and cancel the procedure. . Arrival: You must be in the facility at least 30 minutes prior to your scheduled procedure. . Children: Do not bring children with you. . Dress appropriately: Bring dark clothing that you would not mind if they get stained. . Valuables: Do not bring any jewelry or valuables.  Procedure  appointments are reserved for interventional treatments only. . No Prescription Refills. . No medication changes will be discussed during procedure appointments. . No disability issues will be discussed.  Reasons to call and reschedule or cancel your procedure: (Following these recommendations will minimize the risk of a serious complication.) . Surgeries: Avoid having procedures within 2 weeks of any surgery. (Avoid for 2 weeks before or after any surgery). . Flu Shots: Avoid having procedures within 2 weeks of a flu shots or . (Avoid for 2 weeks before or after immunizations). . Barium: Avoid having a procedure within 7-10 days after having had a radiological study involving the use of radiological contrast. (Myelograms, Barium swallow or enema study). . Heart attacks: Avoid any elective procedures or surgeries for the initial 6 months after a "Myocardial Infarction" (Heart Attack). . Blood thinners: It is imperative that you stop these medications before procedures. Let us know if you if you take any blood thinner.  . Infection: Avoid procedures during or within two weeks of an infection (including chest colds or gastrointestinal problems). Symptoms associated with infections include: Localized redness, fever, chills, night sweats or profuse sweating, burning sensation when voiding, cough, congestion, stuffiness, runny nose, sore throat, diarrhea, nausea, vomiting, cold or Flu symptoms, recent or current infections. It is specially important if the infection is over the area that we intend to treat. . Heart and lung problems: Symptoms that may suggest an active cardiopulmonary problem include: cough, chest pain, breathing difficulties or shortness of breath, dizziness, ankle swelling, uncontrolled high or unusually low blood pressure, and/or palpitations. If you are experiencing any of these symptoms, cancel   your procedure and contact your primary care physician for an evaluation.  Remember:   Regular Business hours are:  Monday to Thursday 8:00 AM to 4:00 PM  Provider's Schedule: Laelia Angelo, MD:  Procedure days: Tuesday and Thursday 7:30 AM to 4:00 PM  Bilal Lateef, MD:  Procedure days: Monday and Wednesday 7:30 AM to 4:00 PM ____________________________________________________________________________________________   ____________________________________________________________________________________________  General Risks and Possible Complications  Patient Responsibilities: It is important that you read this as it is part of your informed consent. It is our duty to inform you of the risks and possible complications associated with treatments offered to you. It is your responsibility as a patient to read this and to ask questions about anything that is not clear or that you believe was not covered in this document.  Patient's Rights: You have the right to refuse treatment. You also have the right to change your mind, even after initially having agreed to have the treatment done. However, under this last option, if you wait until the last second to change your mind, you may be charged for the materials used up to that point.  Introduction: Medicine is not an exact science. Everything in Medicine, including the lack of treatment(s), carries the potential for danger, harm, or loss (which is by definition: Risk). In Medicine, a complication is a secondary problem, condition, or disease that can aggravate an already existing one. All treatments carry the risk of possible complications. The fact that a side effects or complications occurs, does not imply that the treatment was conducted incorrectly. It must be clearly understood that these can happen even when everything is done following the highest safety standards.  No treatment: You can choose not to proceed with the proposed treatment alternative. The "PRO(s)" would include: avoiding the risk of complications associated  with the therapy. The "CON(s)" would include: not getting any of the treatment benefits. These benefits fall under one of three categories: diagnostic; therapeutic; and/or palliative. Diagnostic benefits include: getting information which can ultimately lead to improvement of the disease or symptom(s). Therapeutic benefits are those associated with the successful treatment of the disease. Finally, palliative benefits are those related to the decrease of the primary symptoms, without necessarily curing the condition (example: decreasing the pain from a flare-up of a chronic condition, such as incurable terminal cancer).  General Risks and Complications: These are associated to most interventional treatments. They can occur alone, or in combination. They fall under one of the following six (6) categories: no benefit or worsening of symptoms; bleeding; infection; nerve damage; allergic reactions; and/or death. 1. No benefits or worsening of symptoms: In Medicine there are no guarantees, only probabilities. No healthcare provider can ever guarantee that a medical treatment will work, they can only state the probability that it may. Furthermore, there is always the possibility that the condition may worsen, either directly, or indirectly, as a consequence of the treatment. 2. Bleeding: This is more common if the patient is taking a blood thinner, either prescription or over the counter (example: Goody Powders, Fish oil, Aspirin, Garlic, etc.), or if suffering a condition associated with impaired coagulation (example: Hemophilia, cirrhosis of the liver, low platelet counts, etc.). However, even if you do not have one on these, it can still happen. If you have any of these conditions, or take one of these drugs, make sure to notify your treating physician. 3. Infection: This is more common in patients with a compromised immune system, either due to disease (example: diabetes, cancer,   human immunodeficiency virus  [HIV], etc.), or due to medications or treatments (example: therapies used to treat cancer and rheumatological diseases). However, even if you do not have one on these, it can still happen. If you have any of these conditions, or take one of these drugs, make sure to notify your treating physician. 4. Nerve Damage: This is more common when the treatment is an invasive one, but it can also happen with the use of medications, such as those used in the treatment of cancer. The damage can occur to small secondary nerves, or to large primary ones, such as those in the spinal cord and brain. This damage may be temporary or permanent and it may lead to impairments that can range from temporary numbness to permanent paralysis and/or brain death. 5. Allergic Reactions: Any time a substance or material comes in contact with our body, there is the possibility of an allergic reaction. These can range from a mild skin rash (contact dermatitis) to a severe systemic reaction (anaphylactic reaction), which can result in death. 6. Death: In general, any medical intervention can result in death, most of the time due to an unforeseen complication. ____________________________________________________________________________________________  Pain Management Discharge Instructions  General Discharge Instructions :  If you need to reach your doctor call: Monday-Friday 8:00 am - 4:00 pm at [phone] or toll free [phone].  After clinic hours [phone] to have operator reach doctor.  Bring all of your medication bottles to all your appointments in the pain clinic.  To cancel or reschedule your appointment with Pain Management please remember to call 24 hours in advance to avoid a fee.  Refer to the educational materials which you have been given on: General Risks, I had my Procedure. Discharge Instructions, Post Sedation.  Post Procedure Instructions:  The drugs you were given will stay in your system until  tomorrow, so for the next 24 hours you should not drive, make any legal decisions or drink any alcoholic beverages.  You may eat anything you prefer, but it is better to start with liquids then soups and crackers, and gradually work up to solid foods.  Please notify your doctor immediately if you have any unusual bleeding, trouble breathing or pain that is not related to your normal pain.  Depending on the type of procedure that was done, some parts of your body may feel week and/or numb.  This usually clears up by tonight or the next day.  Walk with the use of an assistive device or accompanied by an adult for the 24 hours.  You may use ice on the affected area for the first 24 hours.  Put ice in a Ziploc bag and cover with a towel and place against area 15 minutes on 15 minutes off.  You may switch to heat after 24 hours.GENERAL RISKS AND COMPLICATIONS  What are the risk, side effects and possible complications? Generally speaking, most procedures are safe.  However, with any procedure there are risks, side effects, and the possibility of complications.  The risks and complications are dependent upon the sites that are lesioned, or the type of nerve block to be performed.  The closer the procedure is to the spine, the more serious the risks are.  Great care is taken when placing the radio frequency needles, block needles or lesioning probes, but sometimes complications can occur. 1. Infection: Any time there is an injection through the skin, there is a risk of infection.  This is why sterile conditions are used for  these blocks.  There are four possible types of infection. 1. Localized skin infection. 2. Central Nervous System Infection-This can be in the form of Meningitis, which can be deadly. 3. Epidural Infections-This can be in the form of an epidural abscess, which can cause pressure inside of the spine, causing compression of the spinal cord with subsequent paralysis. This would require an  emergency surgery to decompress, and there are no guarantees that the patient would recover from the paralysis. 4. Discitis-This is an infection of the intervertebral discs.  It occurs in about 1% of discography procedures.  It is difficult to treat and it may lead to surgery.        2. Pain: the needles have to go through skin and soft tissues, will cause soreness.       3. Damage to internal structures:  The nerves to be lesioned may be near blood vessels or    other nerves which can be potentially damaged.       4. Bleeding: Bleeding is more common if the patient is taking blood thinners such as  aspirin, Coumadin, Ticiid, Plavix, etc., or if he/she have some genetic predisposition  such as hemophilia. Bleeding into the spinal canal can cause compression of the spinal  cord with subsequent paralysis.  This would require an emergency surgery to  decompress and there are no guarantees that the patient would recover from the  paralysis.       5. Pneumothorax:  Puncturing of a lung is a possibility, every time a needle is introduced in  the area of the chest or upper back.  Pneumothorax refers to free air around the  collapsed lung(s), inside of the thoracic cavity (chest cavity).  Another two possible  complications related to a similar event would include: Hemothorax and Chylothorax.   These are variations of the Pneumothorax, where instead of air around the collapsed  lung(s), you may have blood or chyle, respectively.       6. Spinal headaches: They may occur with any procedures in the area of the spine.       7. Persistent CSF (Cerebro-Spinal Fluid) leakage: This is a rare problem, but may occur  with prolonged intrathecal or epidural catheters either due to the formation of a fistulous  track or a dural tear.       8. Nerve damage: By working so close to the spinal cord, there is always a possibility of  nerve damage, which could be as serious as a permanent spinal cord injury with  paralysis.        9. Death:  Although rare, severe deadly allergic reactions known as "Anaphylactic  reaction" can occur to any of the medications used.      10. Worsening of the symptoms:  We can always make thing worse.  What are the chances of something like this happening? Chances of any of this occuring are extremely low.  By statistics, you have more of a chance of getting killed in a motor vehicle accident: while driving to the hospital than any of the above occurring .  Nevertheless, you should be aware that they are possibilities.  In general, it is similar to taking a shower.  Everybody knows that you can slip, hit your head and get killed.  Does that mean that you should not shower again?  Nevertheless always keep in mind that statistics do not mean anything if you happen to be on the wrong side of them.  Even if a  procedure has a 1 (one) in a 1,000,000 (million) chance of going wrong, it you happen to be that one..Also, keep in mind that by statistics, you have more of a chance of having something go wrong when taking medications.  Who should not have this procedure? If you are on a blood thinning medication (e.g. Coumadin, Plavix, see list of "Blood Thinners"), or if you have an active infection going on, you should not have the procedure.  If you are taking any blood thinners, please inform your physician.  How should I prepare for this procedure?  Do not eat or drink anything at least six hours prior to the procedure.  Bring a driver with you .  It cannot be a taxi.  Come accompanied by an adult that can drive you back, and that is strong enough to help you if your legs get weak or numb from the local anesthetic.  Take all of your medicines the morning of the procedure with just enough water to swallow them.  If you have diabetes, make sure that you are scheduled to have your procedure done first thing in the morning, whenever possible.  If you have diabetes, take only half of your insulin dose and  notify our nurse that you have done so as soon as you arrive at the clinic.  If you are diabetic, but only take blood sugar pills (oral hypoglycemic), then do not take them on the morning of your procedure.  You may take them after you have had the procedure.  Do not take aspirin or any aspirin-containing medications, at least eleven (11) days prior to the procedure.  They may prolong bleeding.  Wear loose fitting clothing that may be easy to take off and that you would not mind if it got stained with Betadine or blood.  Do not wear any jewelry or perfume  Remove any nail coloring.  It will interfere with some of our monitoring equipment.  NOTE: Remember that this is not meant to be interpreted as a complete list of all possible complications.  Unforeseen problems may occur.  BLOOD THINNERS The following drugs contain aspirin or other products, which can cause increased bleeding during surgery and should not be taken for 2 weeks prior to and 1 week after surgery.  If you should need take something for relief of minor pain, you may take acetaminophen which is found in Tylenol,m Datril, Anacin-3 and Panadol. It is not blood thinner. The products listed below are.  Do not take any of the products listed below in addition to any listed on your instruction sheet.  A.P.C or A.P.C with Codeine Codeine Phosphate Capsules #3 Ibuprofen Ridaura  ABC compound Congesprin Imuran rimadil  Advil Cope Indocin Robaxisal  Alka-Seltzer Effervescent Pain Reliever and Antacid Coricidin or Coricidin-D  Indomethacin Rufen  Alka-Seltzer plus Cold Medicine Cosprin Ketoprofen S-A-C Tablets  Anacin Analgesic Tablets or Capsules Coumadin Korlgesic Salflex  Anacin Extra Strength Analgesic tablets or capsules CP-2 Tablets Lanoril Salicylate  Anaprox Cuprimine Capsules Levenox Salocol  Anexsia-D Dalteparin Magan Salsalate  Anodynos Darvon compound Magnesium Salicylate Sine-off  Ansaid Dasin Capsules Magsal Sodium  Salicylate  Anturane Depen Capsules Marnal Soma  APF Arthritis pain formula Dewitt's Pills Measurin Stanback  Argesic Dia-Gesic Meclofenamic Sulfinpyrazone  Arthritis Bayer Timed Release Aspirin Diclofenac Meclomen Sulindac  Arthritis pain formula Anacin Dicumarol Medipren Supac  Analgesic (Safety coated) Arthralgen Diffunasal Mefanamic Suprofen  Arthritis Strength Bufferin Dihydrocodeine Mepro Compound Suprol  Arthropan liquid Dopirydamole Methcarbomol with Aspirin Synalgos  ASA  tablets/Enseals Disalcid Micrainin Tagament  Ascriptin Doan's Midol Talwin  Ascriptin A/D Dolene Mobidin Tanderil  Ascriptin Extra Strength Dolobid Moblgesic Ticlid  Ascriptin with Codeine Doloprin or Doloprin with Codeine Momentum Tolectin  Asperbuf Duoprin Mono-gesic Trendar  Aspergum Duradyne Motrin or Motrin IB Triminicin  Aspirin plain, buffered or enteric coated Durasal Myochrisine Trigesic  Aspirin Suppositories Easprin Nalfon Trillsate  Aspirin with Codeine Ecotrin Regular or Extra Strength Naprosyn Uracel  Atromid-S Efficin Naproxen Ursinus  Auranofin Capsules Elmiron Neocylate Vanquish  Axotal Emagrin Norgesic Verin  Azathioprine Empirin or Empirin with Codeine Normiflo Vitamin E  Azolid Emprazil Nuprin Voltaren  Bayer Aspirin plain, buffered or children's or timed BC Tablets or powders Encaprin Orgaran Warfarin Sodium  Buff-a-Comp Enoxaparin Orudis Zorpin  Buff-a-Comp with Codeine Equegesic Os-Cal-Gesic   Buffaprin Excedrin plain, buffered or Extra Strength Oxalid   Bufferin Arthritis Strength Feldene Oxphenbutazone   Bufferin plain or Extra Strength Feldene Capsules Oxycodone with Aspirin   Bufferin with Codeine Fenoprofen Fenoprofen Pabalate or Pabalate-SF   Buffets II Flogesic Panagesic   Buffinol plain or Extra Strength Florinal or Florinal with Codeine Panwarfarin   Buf-Tabs Flurbiprofen Penicillamine   Butalbital Compound Four-way cold tablets Penicillin   Butazolidin Fragmin Pepto-Bismol    Carbenicillin Geminisyn Percodan   Carna Arthritis Reliever Geopen Persantine   Carprofen Gold's salt Persistin   Chloramphenicol Goody's Phenylbutazone   Chloromycetin Haltrain Piroxlcam   Clmetidine heparin Plaquenil   Cllnoril Hyco-pap Ponstel   Clofibrate Hydroxy chloroquine Propoxyphen         Before stopping any of these medications, be sure to consult the physician who ordered them.  Some, such as Coumadin (Warfarin) are ordered to prevent or treat serious conditions such as "deep thrombosis", "pumonary embolisms", and other heart problems.  The amount of time that you may need off of the medication may also vary with the medication and the reason for which you were taking it.  If you are taking any of these medications, please make sure you notify your pain physician before you undergo any procedures.         Facet Blocks Patient Information  Description: The facets are joints in the spine between the vertebrae.  Like any joints in the body, facets can become irritated and painful.  Arthritis can also effect the facets.  By injecting steroids and local anesthetic in and around these joints, we can temporarily block the nerve supply to them.  Steroids act directly on irritated nerves and tissues to reduce selling and inflammation which often leads to decreased pain.  Facet blocks may be done anywhere along the spine from the neck to the low back depending upon the location of your pain.   After numbing the skin with local anesthetic (like Novocaine), a small needle is passed onto the facet joints under x-ray guidance.  You may experience a sensation of pressure while this is being done.  The entire block usually lasts about 15-25 minutes.   Conditions which may be treated by facet blocks:   Low back/buttock pain  Neck/shoulder pain  Certain types of headaches  Preparation for the injection:  1. Do not eat any solid food or dairy products within 8 hours of your  appointment. 2. You may drink clear liquid up to 3 hours before appointment.  Clear liquids include water, black coffee, juice or soda.  No milk or cream please. 3. You may take your regular medication, including pain medications, with a sip of water before your appointment.  Diabetics should hold  regular insulin (if taken separately) and take 1/2 normal NPH dose the morning of the procedure.  Carry some sugar containing items with you to your appointment. 4. A driver must accompany you and be prepared to drive you home after your procedure. 5. Bring all your current medications with you. 6. An IV may be inserted and sedation may be given at the discretion of the physician. 7. A blood pressure cuff, EKG and other monitors will often be applied during the procedure.  Some patients may need to have extra oxygen administered for a short period. 8. You will be asked to provide medical information, including your allergies and medications, prior to the procedure.  We must know immediately if you are taking blood thinners (like Coumadin/Warfarin) or if you are allergic to IV iodine contrast (dye).  We must know if you could possible be pregnant.  Possible side-effects:   Bleeding from needle site  Infection (rare, may require surgery)  Nerve injury (rare)  Numbness & tingling (temporary)  Difficulty urinating (rare, temporary)  Spinal headache (a headache worse with upright posture)  Light-headedness (temporary)  Pain at injection site (serveral days)  Decreased blood pressure (rare, temporary)  Weakness in arm/leg (temporary)  Pressure sensation in back/neck (temporary)   Call if you experience:   Fever/chills associated with headache or increased back/neck pain  Headache worsened by an upright position  New onset, weakness or numbness of an extremity below the injection site  Hives or difficulty breathing (go to the emergency room)  Inflammation or drainage at the injection  site(s)  Severe back/neck pain greater than usual  New symptoms which are concerning to you  Please note:  Although the local anesthetic injected can often make your back or neck feel good for several hours after the injection, the pain will likely return. It takes 3-7 days for steroids to work.  You may not notice any pain relief for at least one week.  If effective, we will often do a series of 2-3 injections spaced 3-6 weeks apart to maximally decrease your pain.  After the initial series, you may be a candidate for a more permanent nerve block of the facets.  If you have any questions, please call #[phone] [facility]

## 2018-05-16 ENCOUNTER — Ambulatory Visit: Payer: Medicare Other | Admitting: Pain Medicine

## 2018-05-16 NOTE — Progress Notes (Deleted)
Patient's Name: Diana Alvarado  MRN: [id number]  Referring Provider: No ref. provider found  DOB: [birth year]  PCP: Default, Provider, MD  DOS: 05/16/2018  Note by: Gaspar Cola, MD  Service setting: Ambulatory outpatient  Specialty: Interventional Pain Management  Patient type: Established  Location: [facility] (AMB) Pain Management Facility  Visit type: Interventional Procedure   Primary Reason for Visit: Interventional Pain Management Treatment. CC: No chief complaint on file.  Procedure:          Anesthesia, Analgesia, Anxiolysis:  Type: Lumbar Facet, Medial Branch Block(s)          Primary Purpose: Diagnostic Region: Posterolateral Lumbosacral Spine Level: L2, L3, L4, L5, & S1 Medial Branch Level(s). Injecting these levels blocks the L3-4, L4-5, and L5-S1 lumbar facet joints. Laterality: Bilateral  Type: Moderate (Conscious) Sedation combined with Local Anesthesia Indication(s): Analgesia and Anxiety Route: Intravenous (IV) IV Access: Secured Sedation: Meaningful verbal contact was maintained at all times during the procedure  Local Anesthetic: Lidocaine 1-2%   Indications: 1. Spondylosis without myelopathy or radiculopathy, lumbosacral region   2. Lumbar facet arthropathy (Bilateral)   3. Lumbar facet syndrome (Bilateral) (L>R)   4. DDD (degenerative disc disease), lumbar   5. Chronic low back pain (Primary Area of Pain) (Bilateral) (L>R) w/ sciatica (Left)    Pain Score: Pre-procedure:  /10 Post-procedure:  /10  Pre-op Assessment:  Diana Alvarado is a 62 y.o. (year old), female patient, seen today for interventional treatment. She  has a past surgical history that includes gall bladder (2005); Abdominal hysterectomy (1997); ruptured disc (1992); and Neck surgery (1995). Diana Alvarado has a current medication list which includes the following prescription(s): acetaminophen, albuterol, amphetamine-dextroamphetamine, aripiprazole, cyclobenzaprine, duloxetine, trazodone, and zolpidem. Her  primarily concern today is the No chief complaint on file.  Initial Vital Signs:  Pulse/HCG Rate:    Temp:   Resp:   BP:   SpO2:    BMI: Estimated body mass index is 28.34 kg/m as calculated from the following:   Height as of 04/29/18: 5\' 1"  (1.549 m).   Weight as of 04/29/18: 150 lb (68 kg).  Risk Assessment: Allergies: Reviewed. She is allergic to aspirin; codeine; and sulfonamide derivatives.  Allergy Precautions: None required Coagulopathies: Reviewed. None identified.  Blood-thinner therapy: None at this time Active Infection(s): Reviewed. None identified. Diana Alvarado is afebrile  Site Confirmation: Diana Alvarado was asked to confirm the procedure and laterality before marking the site Procedure checklist: Completed Consent: Before the procedure and under the influence of no sedative(s), amnesic(s), or anxiolytics, the patient was informed of the treatment options, risks and possible complications. To fulfill our ethical and legal obligations, as recommended by the American Medical Association's Code of Ethics, I have informed the patient of my clinical impression; the nature and purpose of the treatment or procedure; the risks, benefits, and possible complications of the intervention; the alternatives, including doing nothing; the risk(s) and benefit(s) of the alternative treatment(s) or procedure(s); and the risk(s) and benefit(s) of doing nothing. The patient was provided information about the general risks and possible complications associated with the procedure. These may include, but are not limited to: failure to achieve desired goals, infection, bleeding, organ or nerve damage, allergic reactions, paralysis, and death. In addition, the patient was informed of those risks and complications associated to Spine-related procedures, such as failure to decrease pain; infection (i.e.: Meningitis, epidural or intraspinal abscess); bleeding (i.e.: epidural hematoma, subarachnoid hemorrhage, or any  other type of intraspinal or peri-dural bleeding); organ or  nerve damage (i.e.: Any type of peripheral nerve, nerve root, or spinal cord injury) with subsequent damage to sensory, motor, and/or autonomic systems, resulting in permanent pain, numbness, and/or weakness of one or several areas of the body; allergic reactions; (i.e.: anaphylactic reaction); and/or death. Furthermore, the patient was informed of those risks and complications associated with the medications. These include, but are not limited to: allergic reactions (i.e.: anaphylactic or anaphylactoid reaction(s)); adrenal axis suppression; blood sugar elevation that in diabetics may result in ketoacidosis or comma; water retention that in patients with history of congestive heart failure may result in shortness of breath, pulmonary edema, and decompensation with resultant heart failure; weight gain; swelling or edema; medication-induced neural toxicity; particulate matter embolism and blood vessel occlusion with resultant organ, and/or nervous system infarction; and/or aseptic necrosis of one or more joints. Finally, the patient was informed that Medicine is not an exact science; therefore, there is also the possibility of unforeseen or unpredictable risks and/or possible complications that may result in a catastrophic outcome. The patient indicated having understood very clearly. We have given the patient no guarantees and we have made no promises. Enough time was given to the patient to ask questions, all of which were answered to the patient's satisfaction. Diana Alvarado has indicated that she wanted to continue with the procedure. Attestation: I, the ordering provider, attest that I have discussed with the patient the benefits, risks, side-effects, alternatives, likelihood of achieving goals, and potential problems during recovery for the procedure that I have provided informed consent. Date  Time: {CHL [facility]-PAIN TIME  CHOICES:[id number]}  Pre-Procedure Preparation:  Monitoring: As per clinic protocol. Respiration, ETCO2, SpO2, BP, heart rate and rhythm monitor placed and checked for adequate function Safety Precautions: Patient was assessed for positional comfort and pressure points before starting the procedure. Time-out: I initiated and conducted the "Time-out" before starting the procedure, as per protocol. The patient was asked to participate by confirming the accuracy of the "Time Out" information. Verification of the correct person, site, and procedure were performed and confirmed by me, the nursing staff, and the patient. "Time-out" conducted as per Joint Commission's Universal Protocol (UP.01.01.01). Time:    Description of Procedure:          Position: Prone Laterality: Bilateral. The procedure was performed in identical fashion on both sides. Levels:  L2, L3, L4, L5, & S1 Medial Branch Level(s) Area Prepped: Posterior Lumbosacral Region Prepping solution: ChloraPrep (2% chlorhexidine gluconate and 70% isopropyl alcohol) Safety Precautions: Aspiration looking for blood return was conducted prior to all injections. At no point did we inject any substances, as a needle was being advanced. Before injecting, the patient was told to immediately notify me if she was experiencing any new onset of "ringing in the ears, or metallic taste in the mouth". No attempts were made at seeking any paresthesias. Safe injection practices and needle disposal techniques used. Medications properly checked for expiration dates. SDV (single dose vial) medications used. After the completion of the procedure, all disposable equipment used was discarded in the proper designated medical waste containers. Local Anesthesia: Protocol guidelines were followed. The patient was positioned over the fluoroscopy table. The area was prepped in the usual manner. The time-out was completed. The target area was identified using fluoroscopy. A 12-in  long, straight, sterile hemostat was used with fluoroscopic guidance to locate the targets for each level blocked. Once located, the skin was marked with an approved surgical skin marker. Once all sites were marked, the skin (epidermis, dermis,  and hypodermis), as well as deeper tissues (fat, connective tissue and muscle) were infiltrated with a small amount of a short-acting local anesthetic, loaded on a 10cc syringe with a 25G, 1.5-in  Needle. An appropriate amount of time was allowed for local anesthetics to take effect before proceeding to the next step. Local Anesthetic: Lidocaine 2.0% The unused portion of the local anesthetic was discarded in the proper designated containers. Technical explanation of process:  L2 Medial Branch Nerve Block (MBB): The target area for the L2 medial branch is at the junction of the postero-lateral aspect of the superior articular process and the superior, posterior, and medial edge of the transverse process of L3. Under fluoroscopic guidance, a Quincke needle was inserted until contact was made with os over the superior postero-lateral aspect of the pedicular shadow (target area). After negative aspiration for blood, 0.5 mL of the nerve block solution was injected without difficulty or complication. The needle was removed intact. L3 Medial Branch Nerve Block (MBB): The target area for the L3 medial branch is at the junction of the postero-lateral aspect of the superior articular process and the superior, posterior, and medial edge of the transverse process of L4. Under fluoroscopic guidance, a Quincke needle was inserted until contact was made with os over the superior postero-lateral aspect of the pedicular shadow (target area). After negative aspiration for blood, 0.5 mL of the nerve block solution was injected without difficulty or complication. The needle was removed intact. L4 Medial Branch Nerve Block (MBB): The target area for the L4 medial branch is at the junction  of the postero-lateral aspect of the superior articular process and the superior, posterior, and medial edge of the transverse process of L5. Under fluoroscopic guidance, a Quincke needle was inserted until contact was made with os over the superior postero-lateral aspect of the pedicular shadow (target area). After negative aspiration for blood, 0.5 mL of the nerve block solution was injected without difficulty or complication. The needle was removed intact. L5 Medial Branch Nerve Block (MBB): The target area for the L5 medial branch is at the junction of the postero-lateral aspect of the superior articular process and the superior, posterior, and medial edge of the sacral ala. Under fluoroscopic guidance, a Quincke needle was inserted until contact was made with os over the superior postero-lateral aspect of the pedicular shadow (target area). After negative aspiration for blood, 0.5 mL of the nerve block solution was injected without difficulty or complication. The needle was removed intact. S1 Medial Branch Nerve Block (MBB): The target area for the S1 medial branch is at the posterior and inferior 6 o'clock position of the L5-S1 facet joint. Under fluoroscopic guidance, the Quincke needle inserted for the L5 MBB was redirected until contact was made with os over the inferior and postero aspect of the sacrum, at the 6 o' clock position under the L5-S1 facet joint (Target area). After negative aspiration for blood, 0.5 mL of the nerve block solution was injected without difficulty or complication. The needle was removed intact. Procedural Needles: 22-gauge, 3.5-inch, Quincke needles used for all levels. Nerve block solution: 0.2% PF-Ropivacaine + Triamcinolone (40 mg/mL) diluted to a final concentration of 4 mg of Triamcinolone/mL of Ropivacaine The unused portion of the solution was discarded in the proper designated containers.  Once the entire procedure was completed, the treated area was cleaned, making  sure to leave some of the prepping solution back to take advantage of its long term bactericidal properties.   Illustration of the  posterior view of the lumbar spine and the posterior neural structures. Laminae of L2 through S1 are labeled. DPRL5, dorsal primary ramus of L5; DPRS1, dorsal primary ramus of S1; DPR3, dorsal primary ramus of L3; FJ, facet (zygapophyseal) joint L3-L4; I, inferior articular process of L4; LB1, lateral branch of dorsal primary ramus of L1; IAB, inferior articular branches from L3 medial branch (supplies L4-L5 facet joint); IBP, intermediate branch plexus; MB3, medial branch of dorsal primary ramus of L3; NR3, third lumbar nerve root; S, superior articular process of L5; SAB, superior articular branches from L4 (supplies L4-5 facet joint also); TP3, transverse process of L3.  There were no vitals filed for this visit.  Start Time:   hrs. End Time:   hrs.  Imaging Guidance (Spinal):          Type of Imaging Technique: Fluoroscopy Guidance (Spinal) Indication(s): Assistance in needle guidance and placement for procedures requiring needle placement in or near specific anatomical locations not easily accessible without such assistance. Exposure Time: Please see nurses notes. Contrast: None used. Fluoroscopic Guidance: I was personally present during the use of fluoroscopy. "Tunnel Vision Technique" used to obtain the best possible view of the target area. Parallax error corrected before commencing the procedure. "Direction-depth-direction" technique used to introduce the needle under continuous pulsed fluoroscopy. Once target was reached, antero-posterior, oblique, and lateral fluoroscopic projection used confirm needle placement in all planes. Images permanently stored in EMR. Interpretation: No contrast injected. I personally interpreted the imaging intraoperatively. Adequate needle placement confirmed in multiple planes. Permanent images saved into the patient's  record.  Antibiotic Prophylaxis:   Anti-infectives (From admission, onward)   None     Indication(s): None identified  Post-operative Assessment:  Post-procedure Vital Signs:  Pulse/HCG Rate:    Temp:   Resp:   BP:   SpO2:    EBL: None  Complications: No immediate post-treatment complications observed by team, or reported by patient.  Note: The patient tolerated the entire procedure well. A repeat set of vitals were taken after the procedure and the patient was kept under observation following institutional policy, for this type of procedure. Post-procedural neurological assessment was performed, showing return to baseline, prior to discharge. The patient was provided with post-procedure discharge instructions, including a section on how to identify potential problems. Should any problems arise concerning this procedure, the patient was given instructions to immediately contact us, at any time, without hesitation. In any case, we plan to contact the patient by telephone for a follow-up status report regarding this interventional procedure.  Comments:  No additional relevant information.  Plan of Care   Imaging Orders  No imaging studies ordered today   Procedure Orders    No procedure(s) ordered today    Medications ordered for procedure: No orders of the defined types were placed in this encounter.  Medications administered: Diana Alvarado had no medications administered during this visit.  See the medical record for exact dosing, route, and time of administration.  New Prescriptions   No medications on file   Disposition: Discharge home  Discharge Date & Time: 05/16/2018;   hrs.   Physician-requested Follow-up: No follow-ups on file.  Future Appointments  Date Time Provider [place]  05/16/2018  9:45 AM Milinda Pointer, MD [facility] None   Primary Care Physician: Default, Provider, MD Location: [facility] Facility Note by: Gaspar Cola, MD Date: 05/16/2018; Time: 6:44 AM  Disclaimer:  Medicine is not an Chief Strategy Officer. The only guarantee in medicine  is that nothing is guaranteed. It is important to note that the decision to proceed with this intervention was based on the information collected from the patient. The Data and conclusions were drawn from the patient's questionnaire, the interview, and the physical examination. Because the information was provided in large part by the patient, it cannot be guaranteed that it has not been purposely or unconsciously manipulated. Every effort has been made to obtain as much relevant data as possible for this evaluation. It is important to note that the conclusions that lead to this procedure are derived in large part from the available data. Always take into account that the treatment will also be dependent on availability of resources and existing treatment guidelines, considered by other Pain Management Practitioners as being common knowledge and practice, at the time of the intervention. For Medico-Legal purposes, it is also important to point out that variation in procedural techniques and pharmacological choices are the acceptable norm. The indications, contraindications, technique, and results of the above procedure should only be interpreted and judged by a Board-Certified Interventional Pain Specialist with extensive familiarity and expertise in the same exact procedure and technique.

## 2018-05-23 ENCOUNTER — Ambulatory Visit: Payer: Medicare Other | Admitting: Pain Medicine

## 2018-05-28 ENCOUNTER — Ambulatory Visit: Payer: Medicare Other | Admitting: Pain Medicine

## 2018-05-28 NOTE — Progress Notes (Deleted)
Patient did not show up or called to cancel this appointment.

## 2018-05-29 ENCOUNTER — Telehealth: Payer: Self-pay | Admitting: Pain Medicine

## 2018-05-29 NOTE — Telephone Encounter (Signed)
Dr. Dossie Arbour, please advise.

## 2018-05-29 NOTE — Telephone Encounter (Signed)
Patient lvmail Tue at 4:33 stating she was in [place] and could not call to let the office know until then. She would like to reschedule her procedure appt. Patient has cnl and resched this appt 3 times. Please check with Dr. Dossie Arbour before we schedule her for another procedure appt.

## 2018-05-30 NOTE — Telephone Encounter (Signed)
I scheduled patient for next Thursday 06-06-18 at 1:00. I explained that this is the 4th time to schedule this appointment and I was not sure Dr. Dossie Arbour would let her reschedule if she missed this one.

## 2018-05-31 ENCOUNTER — Encounter: Payer: Self-pay | Admitting: *Deleted

## 2018-06-06 ENCOUNTER — Ambulatory Visit: Payer: Medicare Other | Attending: Pain Medicine | Admitting: Pain Medicine

## 2018-06-06 NOTE — Progress Notes (Deleted)
No show

## 2018-06-10 ENCOUNTER — Ambulatory Visit: Payer: Medicare Other | Admitting: Psychiatry

## 2018-06-12 ENCOUNTER — Other Ambulatory Visit: Payer: Self-pay

## 2018-06-12 MED ORDER — ARIPIPRAZOLE 10 MG PO TABS: 10.0000 mg | ORAL_TABLET | Freq: Every day | ORAL | 0 refills | Status: DC

## 2018-06-12 MED ORDER — DULOXETINE HCL 60 MG PO CPEP: 60.0000 mg | ORAL_CAPSULE | Freq: Every day | ORAL | 0 refills | Status: DC

## 2018-07-01 ENCOUNTER — Encounter: Payer: Self-pay | Admitting: Pain Medicine

## 2018-07-01 ENCOUNTER — Ambulatory Visit: Payer: Medicare Other | Admitting: Psychiatry

## 2018-07-01 ENCOUNTER — Other Ambulatory Visit: Payer: Self-pay

## 2018-07-01 DIAGNOSIS — F909 Attention-deficit hyperactivity disorder, unspecified type: Secondary | ICD-10-CM

## 2018-07-01 DIAGNOSIS — F411 Generalized anxiety disorder: Secondary | ICD-10-CM

## 2018-07-01 DIAGNOSIS — F3170 Bipolar disorder, currently in remission, most recent episode unspecified: Secondary | ICD-10-CM | POA: Diagnosis not present

## 2018-07-01 MED ORDER — DULOXETINE HCL 60 MG PO CPEP: 60.0000 mg | ORAL_CAPSULE | Freq: Every day | ORAL | 0 refills | Status: DC

## 2018-07-01 MED ORDER — ARIPIPRAZOLE 10 MG PO TABS: 10.0000 mg | ORAL_TABLET | Freq: Every day | ORAL | 0 refills | Status: DC

## 2018-07-01 MED ORDER — AMPHETAMINE-DEXTROAMPHETAMINE 30 MG PO TABS: 30.0000 mg | ORAL_TABLET | Freq: Two times a day (BID) | ORAL | 0 refills | Status: DC

## 2018-07-01 NOTE — Progress Notes (Addendum)
Needs labs      [facility]  Patient ID: Diana Alvarado,  MRN: [id number]  PCP: Default, Provider, MD  Date of Evaluation: 07/01/2018 Time spent:20 minutes  Chief Complaint:   HISTORY/CURRENT STATUS: HPI   patient is a 62 year old white female last seen 30.  Overall she was doing well.  At time I had received information that the patient had an opioid addiction.  Patient never disclosed this.  Patient denies opioid addiction.  [place] controlled site she seems to be taking her medicines like she supposed to now with no early refills  Individual Medical History/ Review of Systems: Changes? :No   Allergies: Aspirin; Codeine; and Sulfonamide derivatives  Current Medications:  Current Outpatient Medications:  .  albuterol (PROVENTIL HFA;VENTOLIN HFA) 108 (90 BASE) MCG/ACT inhaler, Inhale 2 puffs into the lungs every 6 (six) hours as needed for wheezing or shortness of breath., Disp: , Rfl:  .  [START ON 08/26/2018] amphetamine-dextroamphetamine (ADDERALL) 30 MG tablet, Take 1 tablet by mouth 2 (two) times daily., Disp: 30 tablet, Rfl: 0 .  ARIPiprazole (ABILIFY) 10 MG tablet, Take 1 tablet (10 mg total) by mouth daily., Disp: 90 tablet, Rfl: 0 .  DULoxetine (CYMBALTA) 60 MG capsule, Take 1 capsule (60 mg total) by mouth daily., Disp: 90 capsule, Rfl: 0 .  zolpidem (AMBIEN) 10 MG tablet, Take 10 mg by mouth at bedtime as needed for sleep. , Disp: , Rfl:  .  acetaminophen (TYLENOL) 500 MG tablet, Take 500 mg by mouth every 6 (six) hours as needed., Disp: , Rfl:  .  cyclobenzaprine (FLEXERIL) 10 MG tablet, TAKE 1 TABLET BY MOUTH EVERY 8 HOURS AS NEEDED FOR PAIN/SPASM, Disp: , Rfl: 0 .  traZODone (DESYREL) 100 MG tablet, Take 200 mg by mouth at bedtime., Disp: , Rfl:  Medication Side Effects: none  Family Medical/ Social History: Changes? no  MENTAL HEALTH EXAM:  There were no vitals taken for this visit.There is no height or weight on file to calculate BMI.  General  Appearance: Casual  Eye Contact:  Good  Speech:  Normal Rate  Volume:  Normal  Mood:  Euthymic  Affect:  Appropriate  Thought Process:  Goal Directed  Orientation:  Full (Time, Place, and Person)  Thought Content: Logical   Suicidal Thoughts:  No  Homicidal Thoughts:  No  Memory:  normal  Judgement:  Good  Insight:  Good  Psychomotor Activity:  Normal  Concentration:  Concentration: Good  Recall:  Good  Fund of Knowledge: Good  Language: Good  Assets:  Desire for Improvement  ADL's:  Intact  Cognition: WNL  Prognosis:  Good    DIAGNOSES:    ICD-10-CM   1. Attention deficit hyperactivity disorder (ADHD), unspecified ADHD type F90.9   2. Bipolar disorder in full remission, most recent episode unspecified type ([place]) F31.70   3. Anxiety state F41.1     Receiving Psychotherapy: no   RECOMMENDATIONS: Continue the patient's current medications.  She is on Adderall 30 mg twice daily, Cymbalta 60 mg a day, Abilify 10 mg a day, and Ambien 10 mg at bedtime I will continue to follow her on [place] controlled.   Comer Locket, PA-C

## 2018-07-05 ENCOUNTER — Other Ambulatory Visit: Payer: Self-pay | Admitting: Psychiatry

## 2018-07-05 MED ORDER — ARIPIPRAZOLE 15 MG PO TABS: 15.0000 mg | ORAL_TABLET | Freq: Every day | ORAL | 1 refills | Status: DC

## 2018-07-10 ENCOUNTER — Other Ambulatory Visit: Payer: Self-pay | Admitting: Psychiatry

## 2018-07-10 MED ORDER — AMPHETAMINE-DEXTROAMPHETAMINE 30 MG PO TABS: 30.0000 mg | ORAL_TABLET | Freq: Two times a day (BID) | ORAL | 0 refills | Status: DC

## 2018-07-10 NOTE — Progress Notes (Signed)
Needs 60 tabs adderall/month, Last rx was for 30 tabs. Today sent rx for 60 tabs

## 2018-08-05 ENCOUNTER — Telehealth: Payer: Self-pay | Admitting: Psychiatry

## 2018-08-05 ENCOUNTER — Other Ambulatory Visit: Payer: Self-pay | Admitting: Psychiatry

## 2018-08-05 MED ORDER — AMPHETAMINE-DEXTROAMPHETAMINE 30 MG PO TABS: 30.0000 mg | ORAL_TABLET | Freq: Two times a day (BID) | ORAL | 0 refills | Status: DC

## 2018-08-05 NOTE — Telephone Encounter (Signed)
Needs RF Adderall [facility]  [address]

## 2018-08-07 ENCOUNTER — Telehealth: Payer: Self-pay | Admitting: Psychiatry

## 2018-08-07 NOTE — Telephone Encounter (Signed)
Letter from [facility] treatment [facility] dated 08/02/18 states pt on methadone for opioid addiction. They need list of current meds. Pt has denied opioid addiction in past Will discuss with her at next visit.

## 2018-09-03 ENCOUNTER — Telehealth: Payer: Self-pay | Admitting: Psychiatry

## 2018-09-03 ENCOUNTER — Other Ambulatory Visit: Payer: Self-pay | Admitting: Psychiatry

## 2018-09-03 MED ORDER — AMPHETAMINE-DEXTROAMPHETAMINE 30 MG PO TABS: 30.0000 mg | ORAL_TABLET | Freq: Two times a day (BID) | ORAL | 0 refills | Status: DC

## 2018-09-03 NOTE — Telephone Encounter (Signed)
PATIENT NEED REFILL ON ADDERALL TO BE SENT TO [facility] [address], GOING OUT OF TOWN THIS AFTERNOON

## 2018-09-23 ENCOUNTER — Ambulatory Visit: Payer: Medicare Other | Admitting: Psychiatry

## 2018-09-23 DIAGNOSIS — F909 Attention-deficit hyperactivity disorder, unspecified type: Secondary | ICD-10-CM

## 2018-09-23 DIAGNOSIS — F3130 Bipolar disorder, current episode depressed, mild or moderate severity, unspecified: Secondary | ICD-10-CM

## 2018-09-23 MED ORDER — AMPHETAMINE-DEXTROAMPHETAMINE 30 MG PO TABS: 30.0000 mg | ORAL_TABLET | Freq: Two times a day (BID) | ORAL | 0 refills | Status: DC

## 2018-09-23 MED ORDER — DULOXETINE HCL 60 MG PO CPEP: 60.0000 mg | ORAL_CAPSULE | Freq: Every day | ORAL | 0 refills | Status: DC

## 2018-09-23 MED ORDER — AMPHETAMINE-DEXTROAMPHETAMINE 30 MG PO TABS: ORAL_TABLET | ORAL | 0 refills | Status: DC

## 2018-09-23 MED ORDER — ARIPIPRAZOLE 10 MG PO TABS: 10.0000 mg | ORAL_TABLET | Freq: Every day | ORAL | 1 refills | Status: DC

## 2018-09-23 NOTE — Progress Notes (Signed)
[facility]  Patient ID: Diana Alvarado,  MRN: [id number]  PCP: Default, Provider, MD  Date of Evaluation: 09/23/2018 Time spent:20 minutes  Chief Complaint:   HISTORY/CURRENT STATUS: HPI patient last seen 07/01/2018.  She was doing well.  She continues to do well.  Individual Medical History/ Review of Systems: Changes? :No   Allergies: Aspirin; Codeine; and Sulfonamide derivatives  Current Medications:  Current Outpatient Medications:  .  acetaminophen (TYLENOL) 500 MG tablet, Take 500 mg by mouth every 6 (six) hours as needed., Disp: , Rfl:  .  albuterol (PROVENTIL HFA;VENTOLIN HFA) 108 (90 BASE) MCG/ACT inhaler, Inhale 2 puffs into the lungs every 6 (six) hours as needed for wheezing or shortness of breath., Disp: , Rfl:  .  amphetamine-dextroamphetamine (ADDERALL) 30 MG tablet, Take 1 tablet by mouth 2 (two) times daily., Disp: 60 tablet, Rfl: 0 .  amphetamine-dextroamphetamine (ADDERALL) 30 MG tablet, Take 1 tablet by mouth 2 (two) times daily., Disp: 60 tablet, Rfl: 0 .  DULoxetine (CYMBALTA) 60 MG capsule, Take 1 capsule (60 mg total) by mouth daily., Disp: 90 capsule, Rfl: 0 .  amphetamine-dextroamphetamine (ADDERALL) 30 MG tablet, 1 tab bid, Disp: 60 tablet, Rfl: 0 .  ARIPiprazole (ABILIFY) 10 MG tablet, Take 1 tablet (10 mg total) by mouth daily., Disp: 90 tablet, Rfl: 1 .  cyclobenzaprine (FLEXERIL) 10 MG tablet, TAKE 1 TABLET BY MOUTH EVERY 8 HOURS AS NEEDED FOR PAIN/SPASM, Disp: , Rfl: 0 Medication Side Effects: none  Family Medical/ Social History: Changes? No  MENTAL HEALTH EXAM:  There were no vitals taken for this visit.There is no height or weight on file to calculate BMI.  General Appearance: Casual  Eye Contact:  Good  Speech:  Clear and Coherent  Volume:  Normal  Mood:  Euthymic  Affect:  Appropriate  Thought Process:  Linear  Orientation:  Full (Time, Place, and Person)  Thought Content: Logical   Suicidal Thoughts:  No  Homicidal  Thoughts:  No  Memory:  WNL  Judgement:  Good  Insight:  Good  Psychomotor Activity:  Normal  Concentration:  Concentration: Good  Recall:  Good  Fund of Knowledge: Good  Language: Good  Assets:  Desire for Improvement  ADL's:  Intact  Cognition: WNL  Prognosis:  Good    DIAGNOSES:    ICD-10-CM   1. Attention deficit hyperactivity disorder (ADHD), unspecified ADHD type F90.9   2. Bipolar affective disorder, current episode depressed, current episode severity unspecified ([place]) F31.30     Receiving Psychotherapy: No    RECOMMENDATIONS: Patient will continue her Cymbalta 60 mg a day.  Also continue Adderall 30 mg twice daily.  And continue the Abilify 10 mg a day.  There is been some confusion about if the patient has had a history of opioid addiction.  Patient says she does not.  She says she will get a letter to clarify. I will see her again in 3 months   Comer Locket, [place]

## 2018-09-25 ENCOUNTER — Other Ambulatory Visit: Payer: Self-pay | Admitting: Psychiatry

## 2018-10-04 ENCOUNTER — Encounter: Payer: Self-pay | Admitting: Emergency Medicine

## 2018-12-01 ENCOUNTER — Other Ambulatory Visit: Payer: Self-pay | Admitting: Psychiatry

## 2018-12-11 ENCOUNTER — Other Ambulatory Visit: Payer: Self-pay | Admitting: Psychiatry

## 2018-12-17 ENCOUNTER — Telehealth: Payer: Self-pay | Admitting: Psychiatry

## 2018-12-17 NOTE — Telephone Encounter (Signed)
[facility] patient need refill on Adderall 30 mg. 2x day will need script for the first of next week sent to [facility] on [address]

## 2018-12-18 NOTE — Telephone Encounter (Signed)
Last fill 11/27/2018

## 2018-12-20 ENCOUNTER — Other Ambulatory Visit: Payer: Self-pay | Admitting: Physician Assistant

## 2018-12-20 ENCOUNTER — Other Ambulatory Visit: Payer: Self-pay

## 2018-12-20 MED ORDER — AMPHETAMINE-DEXTROAMPHETAMINE 30 MG PO TABS: 30.0000 mg | ORAL_TABLET | Freq: Two times a day (BID) | ORAL | 0 refills | Status: DC

## 2018-12-20 NOTE — Telephone Encounter (Signed)
submitted

## 2018-12-23 ENCOUNTER — Ambulatory Visit: Payer: Medicare Other | Admitting: Psychiatry

## 2018-12-30 ENCOUNTER — Ambulatory Visit (INDEPENDENT_AMBULATORY_CARE_PROVIDER_SITE_OTHER): Payer: Medicare Other | Admitting: Physician Assistant

## 2018-12-30 ENCOUNTER — Other Ambulatory Visit: Payer: Self-pay

## 2018-12-30 ENCOUNTER — Encounter: Payer: Self-pay | Admitting: Physician Assistant

## 2018-12-30 DIAGNOSIS — F909 Attention-deficit hyperactivity disorder, unspecified type: Secondary | ICD-10-CM | POA: Diagnosis not present

## 2018-12-30 DIAGNOSIS — F3170 Bipolar disorder, currently in remission, most recent episode unspecified: Secondary | ICD-10-CM

## 2018-12-30 MED ORDER — AMPHETAMINE-DEXTROAMPHETAMINE 30 MG PO TABS: 30.0000 mg | ORAL_TABLET | Freq: Two times a day (BID) | ORAL | 0 refills | Status: DC

## 2018-12-30 NOTE — Progress Notes (Signed)
[facility]  Patient ID: Diana Alvarado,  MRN: [id number]  PCP: Default, Provider, MD  Date of Evaluation: 12/30/2018 Time spent:15 minutes  Chief Complaint:  Chief Complaint    Follow-up     Virtual Visit via Telephone Note  I connected with patient by a video enabled telemedicine application or telephone, with their informed consent, and verified patient privacy and that I am speaking with the correct person using two identifiers.  I am private, in my home and the patient is home.   I discussed the limitations, risks, security and privacy concerns of performing an evaluation and management service by telephone and the availability of in person appointments. I also discussed with the patient that there may be a patient responsible charge related to this service. The patient expressed understanding and agreed to proceed.   I discussed the assessment and treatment plan with the patient. The patient was provided an opportunity to ask questions and all were answered. The patient agreed with the plan and demonstrated an understanding of the instructions.   The patient was advised to call back or seek an in-person evaluation if the symptoms worsen or if the condition fails to improve as anticipated.  I provided 15 minutes of non-face-to-face time during this encounter.  HISTORY/CURRENT STATUS: HPI for 39-month med check.  Diana Alvarado is a former patient of Diana Alvarado, [place] and is transferred to my care.  States she is doing really well.  Her depression is well controlled.  She likes the medications that she is on.  She has no side effects.  She is able to enjoy things as much she can right now under the circumstances.  She does isolate but only because of the coronavirus pandemic.  Her energy and motivation are good.  She is not crying easily.  Sleeps well and feels rested when she gets up.  States that the Adderall still works well.  She denies palpitations, insomnia or other side  effects.  He is able to focus well and get things finished.  She does not get easily distracted.  Patient denies increased energy with decreased need for sleep, no increased talkativeness, no racing thoughts, no impulsivity or risky behaviors, no increased spending, no increased libido, no grandiosity.  Denies muscle or joint pain, stiffness, or dystonia.  Denies dizziness, syncope, seizures, numbness, tingling, tremor, tics, unsteady gait, slurred speech, confusion.   Individual Medical History/ Review of Systems: Changes? :No   Allergies: Aspirin; Codeine; and Sulfonamide derivatives  Current Medications:  Current Outpatient Medications:  .  acetaminophen (TYLENOL) 500 MG tablet, Take 500 mg by mouth every 6 (six) hours as needed., Disp: , Rfl:  .  albuterol (PROVENTIL HFA;VENTOLIN HFA) 108 (90 BASE) MCG/ACT inhaler, Inhale 2 puffs into the lungs every 6 (six) hours as needed for wheezing or shortness of breath., Disp: , Rfl:  .  amphetamine-dextroamphetamine (ADDERALL) 30 MG tablet, Take 1 tablet by mouth 2 (two) times daily., Disp: 60 tablet, Rfl: 0 .  [START ON 01/22/2019] amphetamine-dextroamphetamine (ADDERALL) 30 MG tablet, Take 1 tablet by mouth 2 (two) times daily., Disp: 60 tablet, Rfl: 0 .  [START ON 02/21/2019] amphetamine-dextroamphetamine (ADDERALL) 30 MG tablet, Take 1 tablet by mouth 2 (two) times daily. 1 tab bid, Disp: 60 tablet, Rfl: 0 .  ARIPiprazole (ABILIFY) 10 MG tablet, TAKE 1 TABLET BY MOUTH EVERY DAY, Disp: 90 tablet, Rfl: 0 .  Buprenorphine 15 MCG/HR PTWK, buprenorphine 15 mcg/hour weekly transdermal patch, Disp: , Rfl:  .  DULoxetine (CYMBALTA)  60 MG capsule, TAKE 1 CAPSULE BY MOUTH EVERY DAY, Disp: 90 capsule, Rfl: 0 .  oxyCODONE-acetaminophen (PERCOCET) 10-325 MG tablet, TK 1 T PO Q 6 H PRN P, Disp: , Rfl:  .  [START ON 03/22/2019] amphetamine-dextroamphetamine (ADDERALL) 30 MG tablet, Take 1 tablet by mouth 2 (two) times daily., Disp: 60 tablet, Rfl: 0 .   cyclobenzaprine (FLEXERIL) 10 MG tablet, TAKE 1 TABLET BY MOUTH EVERY 8 HOURS AS NEEDED FOR PAIN/SPASM, Disp: , Rfl: 0 Medication Side Effects: none  Family Medical/ Social History: Changes? Yes is cooped up at home due to the coronavirus pandemic but no other changes.  MENTAL HEALTH EXAM:  There were no vitals taken for this visit.There is no height or weight on file to calculate BMI.  General Appearance: Telephone visit unable to assess  Eye Contact:  Unable to assess  Speech:  Clear and Coherent  Volume:  Normal  Mood:  Euthymic  Affect:  Unable to assess  Thought Process:  Goal Directed  Orientation:  Full (Time, Place, and Person)  Thought Content: Logical   Suicidal Thoughts:  No  Homicidal Thoughts:  No  Memory:  WNL  Judgement:  Good  Insight:  Good  Psychomotor Activity:  Unable to assess  Concentration:  Concentration: Good and Attention Span: Good  Recall:  Good  Fund of Knowledge: Good  Language: Good  Assets:  Desire for Improvement  ADL's:  Intact  Cognition: WNL  Prognosis:  Good    DIAGNOSES:    ICD-10-CM   1. Attention deficit hyperactivity disorder (ADHD), unspecified ADHD type F90.9   2. Bipolar disorder in full remission, most recent episode unspecified type ([place]) F31.70     Receiving Psychotherapy: No    RECOMMENDATIONS:  Continue Adderall 30 mg twice daily.  PDMP was reviewed Continue Abilify 10 mg daily. Continue Cymbalta 60 mg p.o. daily. Return in 3 months   Donnal Moat, PA-C   This record has been created using [employer].  Chart creation errors have been sought, but may not always have been located and corrected. Such creation errors do not reflect on the standard of medical care.

## 2019-03-10 ENCOUNTER — Other Ambulatory Visit: Payer: Self-pay

## 2019-03-10 MED ORDER — ARIPIPRAZOLE 10 MG PO TABS: 10.0000 mg | ORAL_TABLET | Freq: Every day | ORAL | 0 refills | Status: DC

## 2019-03-10 MED ORDER — DULOXETINE HCL 60 MG PO CPEP: ORAL_CAPSULE | ORAL | 0 refills | Status: DC

## 2019-03-27 ENCOUNTER — Other Ambulatory Visit: Payer: Self-pay | Admitting: Psychiatry

## 2019-03-27 ENCOUNTER — Telehealth: Payer: Self-pay | Admitting: Physician Assistant

## 2019-03-27 NOTE — Telephone Encounter (Signed)
Diana Alvarado called to request refill of her Adderall.  She went swimming yesterday and had her adderall in her pocket so the pills were destroyed.  She had just picked it up so lost 29 pills.  Can bring in the bottle if you want her to.  She knows she'll probably have to pay out of pocket for it.  Please send in new script with approval for early refill.  She has appt. 7/27.  Pharmacy is [facility] on [address].

## 2019-03-27 NOTE — Telephone Encounter (Signed)
This is a controlled substance and I will not OK early refill.  Diana Alvarado is expected back next week.  She can discuss it with her then.  I'm concerned she's also taking high dosage oxycodone also.

## 2019-03-28 NOTE — Telephone Encounter (Signed)
Pt called back and made aware.

## 2019-03-31 ENCOUNTER — Encounter: Payer: Self-pay | Admitting: Physician Assistant

## 2019-03-31 ENCOUNTER — Other Ambulatory Visit: Payer: Self-pay

## 2019-03-31 ENCOUNTER — Ambulatory Visit (INDEPENDENT_AMBULATORY_CARE_PROVIDER_SITE_OTHER): Payer: Medicare Other | Admitting: Physician Assistant

## 2019-03-31 DIAGNOSIS — F909 Attention-deficit hyperactivity disorder, unspecified type: Secondary | ICD-10-CM

## 2019-03-31 DIAGNOSIS — F3170 Bipolar disorder, currently in remission, most recent episode unspecified: Secondary | ICD-10-CM

## 2019-03-31 MED ORDER — CHANTIX STARTING MONTH PAK 0.5 MG X 11 & 1 MG X 42 PO TABS: ORAL_TABLET | ORAL | 0 refills | Status: DC

## 2019-03-31 MED ORDER — AMPHETAMINE-DEXTROAMPHETAMINE 30 MG PO TABS: 30.0000 mg | ORAL_TABLET | Freq: Two times a day (BID) | ORAL | 0 refills | Status: DC

## 2019-03-31 NOTE — Telephone Encounter (Signed)
No action needed.  Has appt today and I'll discuss.

## 2019-03-31 NOTE — Progress Notes (Signed)
[facility]  Patient ID: Diana Alvarado,  MRN: [id number]  PCP: Default, Provider, MD  Date of Evaluation: 03/31/2019 Time spent:15 minutes  Chief Complaint:  Chief Complaint    ADHD     Virtual Visit via Telephone Note  I connected with patient by a video enabled telemedicine application or telephone, with their informed consent, and verified patient privacy and that I am speaking with the correct person using two identifiers.  I am private, in my home and the patient is home.   I discussed the limitations, risks, security and privacy concerns of performing an evaluation and management service by telephone and the availability of in person appointments. I also discussed with the patient that there may be a patient responsible charge related to this service. The patient expressed understanding and agreed to proceed.   I discussed the assessment and treatment plan with the patient. The patient was provided an opportunity to ask questions and all were answered. The patient agreed with the plan and demonstrated an understanding of the instructions.   The patient was advised to call back or seek an in-person evaluation if the symptoms worsen or if the condition fails to improve as anticipated.  I provided 15 minutes of non-face-to-face time during this encounter.  HISTORY/CURRENT STATUS: HPI for 26-month med check.  She had called last week stating that she had almost a full bottle of Adderall in her bathing suit top and forgot about it.  She went in the water and the pills got wet and turned to sludge.  Asking for early refill.    Wants to quit smoking.  Asks for Chantix.  She has taken it before without any problems.  States she is doing well. Patient denies loss of interest in usual activities and is able to enjoy things.  Denies decreased energy or motivation.  Appetite has not changed.  No extreme sadness, tearfulness, or feelings of hopelessness.  Denies any changes in  concentration, making decisions or remembering things.  Denies suicidal or homicidal thoughts.  Patient denies increased energy with decreased need for sleep, no increased talkativeness, no racing thoughts, no impulsivity or risky behaviors, no increased spending, no increased libido, no grandiosity.  Denies dizziness, syncope, seizures, numbness, tingling, tremor, tics, unsteady gait, slurred speech, confusion. Denies muscle or joint pain, stiffness, or dystonia.  Individual Medical History/ Review of Systems: Changes? :Yes  had CT chest- benign nodules, saw pulmonologist and they want to watch.  Will have repeat CT in 6 months.  They've also ordered sleep study.   Past medications for mental health diagnoses include: Uncertain  Allergies: Aspirin, Codeine, and Sulfonamide derivatives  Current Medications:  Current Outpatient Medications:  .  acetaminophen (TYLENOL) 500 MG tablet, Take 500 mg by mouth every 6 (six) hours as needed., Disp: , Rfl:  .  albuterol (PROVENTIL HFA;VENTOLIN HFA) 108 (90 BASE) MCG/ACT inhaler, Inhale 2 puffs into the lungs every 6 (six) hours as needed for wheezing or shortness of breath., Disp: , Rfl:  .  amphetamine-dextroamphetamine (ADDERALL) 30 MG tablet, Take 1 tablet by mouth 2 (two) times daily., Disp: 60 tablet, Rfl: 0 .  amphetamine-dextroamphetamine (ADDERALL) 30 MG tablet, Take 1 tablet by mouth 2 (two) times daily. 1 tab bid, Disp: 60 tablet, Rfl: 0 .  amphetamine-dextroamphetamine (ADDERALL) 30 MG tablet, Take 1 tablet by mouth 2 (two) times daily., Disp: 60 tablet, Rfl: 0 .  [START ON 04/23/2019] amphetamine-dextroamphetamine (ADDERALL) 30 MG tablet, Take 1 tablet by mouth 2 (two) times daily.,  Disp: 60 tablet, Rfl: 0 .  ARIPiprazole (ABILIFY) 10 MG tablet, Take 1 tablet (10 mg total) by mouth daily., Disp: 90 tablet, Rfl: 0 .  DULoxetine (CYMBALTA) 60 MG capsule, TAKE 1 CAPSULE BY MOUTH EVERY DAY, Disp: 90 capsule, Rfl: 0 .  oxyCODONE-acetaminophen  (PERCOCET) 10-325 MG tablet, TK 1 T PO Q 6 H PRN P, Disp: , Rfl:  .  Buprenorphine 15 MCG/HR PTWK, buprenorphine 15 mcg/hour weekly transdermal patch, Disp: , Rfl:  .  cyclobenzaprine (FLEXERIL) 10 MG tablet, TAKE 1 TABLET BY MOUTH EVERY 8 HOURS AS NEEDED FOR PAIN/SPASM, Disp: , Rfl: 0 .  varenicline (CHANTIX STARTING MONTH PAK) 0.5 MG X 11 & 1 MG X 42 tablet, Take one 0.5 mg tablet by mouth once daily for 3 days, then increase to one 0.5 mg tablet twice daily for 4 days, then increase to one 1 mg tablet twice daily., Disp: 53 tablet, Rfl: 0 Medication Side Effects: none  Family Medical/ Social History: Changes? No  MENTAL HEALTH EXAM:  There were no vitals taken for this visit.There is no height or weight on file to calculate BMI.  General Appearance: unable to assess  Eye Contact:  unable to assess  Speech:  Clear and Coherent  Volume:  Normal  Mood:  Euthymic  Affect:  Unable to assess  Thought Process:  Goal Directed  Orientation:  Full (Time, Place, and Person)  Thought Content: Logical   Suicidal Thoughts:  No  Homicidal Thoughts:  No  Memory:  WNL  Judgement:  Good  Insight:  Good  Psychomotor Activity:  Unable to assess  Concentration:  Concentration: Good  Recall:  Good  Fund of Knowledge: Good  Language: Good  Assets:  Desire for Improvement  ADL's:  Intact  Cognition: WNL  Prognosis:  Good    DIAGNOSES:    ICD-10-CM   1. Bipolar disorder in full remission, most recent episode unspecified type ([place])  F31.70   2. Attention deficit hyperactivity disorder (ADHD), unspecified ADHD type  F90.9     Receiving Psychotherapy: No    RECOMMENDATIONS:  Patient advised there are no early refills on controlled substances. Continue all that prescription until Adderall 30 mg 1 twice daily but not able to fill that prescription until 04/23/2019.  PDMP was reviewed. Continue Abilify 10 mg p.o. daily. Continue Cymbalta 60 mg daily. Start Chantix first month starting pack.   Smoking cessation counseling done.  She knows to contact me if depression worsens or if she has suicidal or homicidal thoughts. Return in 1 month.  Donnal Moat, PA-C   This record has been created using [employer].  Chart creation errors have been sought, but may not always have been located and corrected. Such creation errors do not reflect on the standard of medical care.

## 2019-05-06 ENCOUNTER — Ambulatory Visit: Payer: Medicare Other | Admitting: Physician Assistant

## 2019-05-23 ENCOUNTER — Telehealth: Payer: Self-pay | Admitting: Physician Assistant

## 2019-05-23 ENCOUNTER — Other Ambulatory Visit: Payer: Self-pay

## 2019-05-23 MED ORDER — AMPHETAMINE-DEXTROAMPHETAMINE 30 MG PO TABS: 30.0000 mg | ORAL_TABLET | Freq: Two times a day (BID) | ORAL | 0 refills | Status: DC

## 2019-05-23 NOTE — Telephone Encounter (Signed)
Last refill 04/23/2019 pended for approval

## 2019-05-23 NOTE — Telephone Encounter (Signed)
Patient need refill on Adderall to be sent to [facility] on [address]

## 2019-06-02 ENCOUNTER — Other Ambulatory Visit: Payer: Self-pay

## 2019-06-02 MED ORDER — DULOXETINE HCL 60 MG PO CPEP: ORAL_CAPSULE | ORAL | 0 refills | Status: DC

## 2019-06-02 MED ORDER — ARIPIPRAZOLE 10 MG PO TABS: 10.0000 mg | ORAL_TABLET | Freq: Every day | ORAL | 0 refills | Status: DC

## 2019-06-11 ENCOUNTER — Ambulatory Visit (INDEPENDENT_AMBULATORY_CARE_PROVIDER_SITE_OTHER): Payer: Medicare Other | Admitting: Physician Assistant

## 2019-06-11 ENCOUNTER — Encounter: Payer: Self-pay | Admitting: Physician Assistant

## 2019-06-11 ENCOUNTER — Other Ambulatory Visit: Payer: Self-pay

## 2019-06-11 DIAGNOSIS — F3170 Bipolar disorder, currently in remission, most recent episode unspecified: Secondary | ICD-10-CM | POA: Diagnosis not present

## 2019-06-11 DIAGNOSIS — F909 Attention-deficit hyperactivity disorder, unspecified type: Secondary | ICD-10-CM | POA: Diagnosis not present

## 2019-06-11 MED ORDER — AMPHETAMINE-DEXTROAMPHETAMINE 30 MG PO TABS: 30.0000 mg | ORAL_TABLET | Freq: Two times a day (BID) | ORAL | 0 refills | Status: DC

## 2019-06-11 NOTE — Progress Notes (Signed)
[facility]  Patient ID: Diana Alvarado,  MRN: [id number]  PCP: Default, Provider, MD  Date of Evaluation: 06/11/2019 Time spent:15 minutes  Chief Complaint:  Chief Complaint    ADD; Follow-up      HISTORY/CURRENT STATUS: HPI for routine med check.  Diana Alvarado was a patient of Comer Locket, [place], who passed away 6 months ago.  States she is doing very well.  The Adderall continues to work well.  She is able to stay on task and not be distracted easily.  She is not having any side effects from the Adderall.  She works from home.  Sleeps well.  The Cymbalta and Abilify have really helped her mood.  She denies anhedonia, decreased energy or motivation, no easy crying, no isolating anymore than is required due to the coronavirus pandemic, no suicidal or homicidal thoughts.  Patient denies increased energy with decreased need for sleep, no increased talkativeness, no racing thoughts, no impulsivity or risky behaviors, no increased spending, no increased libido, no grandiosity.  Denies dizziness, syncope, seizures, numbness, tingling, tremor, tics, unsteady gait, slurred speech, confusion. Denies muscle or joint pain, stiffness, or dystonia.  Individual Medical History/ Review of Systems: Changes? :No    Past medications for mental health diagnoses include: Uncertain  Allergies: Aspirin, Codeine, and Sulfonamide derivatives  Current Medications:  Current Outpatient Medications:  .  acetaminophen (TYLENOL) 500 MG tablet, Take 500 mg by mouth every 6 (six) hours as needed., Disp: , Rfl:  .  albuterol (PROVENTIL HFA;VENTOLIN HFA) 108 (90 BASE) MCG/ACT inhaler, Inhale 2 puffs into the lungs every 6 (six) hours as needed for wheezing or shortness of breath., Disp: , Rfl:  .  amphetamine-dextroamphetamine (ADDERALL) 30 MG tablet, Take 1 tablet by mouth 2 (two) times daily., Disp: 60 tablet, Rfl: 0 .  [START ON 08/16/2019] amphetamine-dextroamphetamine (ADDERALL) 30 MG tablet, Take 1  tablet by mouth 2 (two) times daily. 1 tab bid, Disp: 60 tablet, Rfl: 0 .  [START ON 07/19/2019] amphetamine-dextroamphetamine (ADDERALL) 30 MG tablet, Take 1 tablet by mouth 2 (two) times daily., Disp: 60 tablet, Rfl: 0 .  [START ON 06/20/2019] amphetamine-dextroamphetamine (ADDERALL) 30 MG tablet, Take 1 tablet by mouth 2 (two) times daily., Disp: 60 tablet, Rfl: 0 .  ARIPiprazole (ABILIFY) 10 MG tablet, Take 1 tablet (10 mg total) by mouth daily., Disp: 90 tablet, Rfl: 0 .  DULoxetine (CYMBALTA) 60 MG capsule, TAKE 1 CAPSULE BY MOUTH EVERY DAY, Disp: 90 capsule, Rfl: 0 .  Buprenorphine 15 MCG/HR PTWK, buprenorphine 15 mcg/hour weekly transdermal patch, Disp: , Rfl:  .  cyclobenzaprine (FLEXERIL) 10 MG tablet, TAKE 1 TABLET BY MOUTH EVERY 8 HOURS AS NEEDED FOR PAIN/SPASM, Disp: , Rfl: 0 .  oxyCODONE-acetaminophen (PERCOCET) 10-325 MG tablet, TK 1 T PO Q 6 H PRN P, Disp: , Rfl:  .  varenicline (CHANTIX STARTING MONTH PAK) 0.5 MG X 11 & 1 MG X 42 tablet, Take one 0.5 mg tablet by mouth once daily for 3 days, then increase to one 0.5 mg tablet twice daily for 4 days, then increase to one 1 mg tablet twice daily. (Patient not taking: Reported on 06/11/2019), Disp: 53 tablet, Rfl: 0 Medication Side Effects: none  Family Medical/ Social History: Changes? No  MENTAL HEALTH EXAM:  There were no vitals taken for this visit.There is no height or weight on file to calculate BMI.  General Appearance: Casual, Neat and Well Groomed  Eye Contact:  Good  Speech:  Clear and Coherent  Volume:  Normal  Mood:  Euthymic  Affect:  Appropriate  Thought Process:  Goal Directed and Descriptions of Associations: Intact  Orientation:  Full (Time, Place, and Person)  Thought Content: Logical   Suicidal Thoughts:  No  Homicidal Thoughts:  No  Memory:  WNL  Judgement:  Good  Insight:  Good  Psychomotor Activity:  Normal  Concentration:  Concentration: Good and Attention Span: Good  Recall:  Good  Fund of  Knowledge: Good  Language: Good  Assets:  Desire for Improvement  ADL's:  Intact  Cognition: WNL  Prognosis:  Good  Labs 01/09/2019 Glucose 88, 11/11/2018 Chol wnl  DIAGNOSES:    ICD-10-CM   1. Bipolar disorder in full remission, most recent episode unspecified type ([place])  F31.70   2. Attention deficit hyperactivity disorder (ADHD), unspecified ADHD type  F90.9     Receiving Psychotherapy: No    RECOMMENDATIONS:  PDMP was reviewed. Continue Adderall 30 mg 1 twice daily. Continue Abilify 10 mg daily. Continue Cymbalta 60 mg 1 daily. Return in 6 months.  Donnal Moat, PA-C

## 2019-06-14 ENCOUNTER — Other Ambulatory Visit: Payer: Self-pay | Admitting: Physician Assistant

## 2019-06-15 ENCOUNTER — Other Ambulatory Visit: Payer: Self-pay | Admitting: Physician Assistant

## 2019-08-26 ENCOUNTER — Other Ambulatory Visit: Payer: Self-pay | Admitting: Physician Assistant

## 2019-09-05 ENCOUNTER — Other Ambulatory Visit: Payer: Self-pay | Admitting: Physician Assistant

## 2019-09-22 ENCOUNTER — Telehealth: Payer: Self-pay | Admitting: Physician Assistant

## 2019-09-22 ENCOUNTER — Other Ambulatory Visit: Payer: Self-pay

## 2019-09-22 MED ORDER — AMPHETAMINE-DEXTROAMPHETAMINE 30 MG PO TABS: 30.0000 mg | ORAL_TABLET | Freq: Two times a day (BID) | ORAL | 0 refills | Status: DC

## 2019-09-22 NOTE — Telephone Encounter (Signed)
Pt called to request refill for Adderall 30 mg 2/d @ [facility] on [address]. Next appt 4/7

## 2019-09-22 NOTE — Telephone Encounter (Signed)
Last refill 08/22/2019 Pended 3 Rx's for Helene Kelp to submit until apt in April

## 2019-11-21 ENCOUNTER — Other Ambulatory Visit: Payer: Self-pay | Admitting: Physician Assistant

## 2019-12-10 ENCOUNTER — Other Ambulatory Visit: Payer: Self-pay

## 2019-12-10 ENCOUNTER — Encounter: Payer: Self-pay | Admitting: Physician Assistant

## 2019-12-10 ENCOUNTER — Ambulatory Visit (INDEPENDENT_AMBULATORY_CARE_PROVIDER_SITE_OTHER): Payer: Medicare Other | Admitting: Physician Assistant

## 2019-12-10 DIAGNOSIS — F319 Bipolar disorder, unspecified: Secondary | ICD-10-CM

## 2019-12-10 DIAGNOSIS — F411 Generalized anxiety disorder: Secondary | ICD-10-CM

## 2019-12-10 DIAGNOSIS — F909 Attention-deficit hyperactivity disorder, unspecified type: Secondary | ICD-10-CM | POA: Diagnosis not present

## 2019-12-10 MED ORDER — AMPHETAMINE-DEXTROAMPHETAMINE 30 MG PO TABS: 30.0000 mg | ORAL_TABLET | Freq: Two times a day (BID) | ORAL | 0 refills | Status: DC

## 2019-12-10 MED ORDER — ALPRAZOLAM 0.5 MG PO TABS: 0.2500 mg | ORAL_TABLET | Freq: Two times a day (BID) | ORAL | 0 refills | Status: DC | PRN

## 2019-12-10 MED ORDER — HYDROXYZINE HCL 10 MG PO TABS: 10.0000 mg | ORAL_TABLET | Freq: Three times a day (TID) | ORAL | 0 refills | Status: DC | PRN

## 2019-12-10 NOTE — Progress Notes (Signed)
[facility]  Patient ID: Diana Alvarado,  MRN: [id number]  PCP: Default, Provider, MD  Date of Evaluation: 12/10/2019 Time spent:30 minutes  Chief Complaint:  Chief Complaint    Depression; Anxiety; ADD      HISTORY/CURRENT STATUS: HPI For routine med check.  Is having more anxiety. Keeps her grandkids, 1 is an infant and the other is around 63 years old.  "They keep me busy and I just get real anxious sometimes."  Has taken Xanax in the past which did help.  Not having panic attacks, just a generalized sense of unease or sense that something bad is going to happen.  Her other medications are working well.  She is able to enjoy things.  Energy and motivation are good.  Not crying easily.  Not isolating.  No SI/HI.  Patient denies increased energy with decreased need for sleep, no increased talkativeness, no racing thoughts, no impulsivity or risky behaviors, no increased spending, no increased libido, no grandiosity, no increased irritability or anger, and no hallucinations.  She is able to focus on things and get things done in a timely manner.  The Adderall helps that as well as her mood.  Individual Medical History/ Review of Systems: Changes? :No    Past medications for mental health diagnoses include: Xanax, Buspar wasn't effective, Cymbalta, Abilify  Allergies: Aspirin, Codeine, and Sulfonamide derivatives  Current Medications:  Current Outpatient Medications:  .  acetaminophen (TYLENOL) 500 MG tablet, Take 500 mg by mouth every 6 (six) hours as needed., Disp: , Rfl:  .  albuterol (PROVENTIL HFA;VENTOLIN HFA) 108 (90 BASE) MCG/ACT inhaler, Inhale 2 puffs into the lungs every 6 (six) hours as needed for wheezing or shortness of breath., Disp: , Rfl:  .  amphetamine-dextroamphetamine (ADDERALL) 30 MG tablet, Take 1 tablet by mouth 2 (two) times daily., Disp: 60 tablet, Rfl: 0 .  [START ON 02/13/2020] amphetamine-dextroamphetamine (ADDERALL) 30 MG tablet, Take 1 tablet  by mouth 2 (two) times daily., Disp: 60 tablet, Rfl: 0 .  [START ON 01/15/2020] amphetamine-dextroamphetamine (ADDERALL) 30 MG tablet, Take 1 tablet by mouth 2 (two) times daily., Disp: 60 tablet, Rfl: 0 .  [START ON 12/18/2019] amphetamine-dextroamphetamine (ADDERALL) 30 MG tablet, Take 1 tablet by mouth 2 (two) times daily. 1 tab bid, Disp: 60 tablet, Rfl: 0 .  ARIPiprazole (ABILIFY) 10 MG tablet, TAKE 1 TABLET(10 MG) BY MOUTH DAILY, Disp: 90 tablet, Rfl: 0 .  DULoxetine (CYMBALTA) 60 MG capsule, TAKE 1 CAPSULE BY MOUTH EVERY DAY, Disp: 90 capsule, Rfl: 1 .  ALPRAZolam (XANAX) 0.5 MG tablet, Take 0.5-1 tablets (0.25-0.5 mg total) by mouth 2 (two) times daily as needed for anxiety., Disp: 20 tablet, Rfl: 0 .  Buprenorphine 15 MCG/HR PTWK, buprenorphine 15 mcg/hour weekly transdermal patch, Disp: , Rfl:  .  cyclobenzaprine (FLEXERIL) 10 MG tablet, TAKE 1 TABLET BY MOUTH EVERY 8 HOURS AS NEEDED FOR PAIN/SPASM, Disp: , Rfl: 0 .  fentaNYL (DURAGESIC) 50 MCG/HR, 1 patch every other day., Disp: , Rfl:  .  hydrOXYzine (ATARAX/VISTARIL) 10 MG tablet, Take 1-3 tablets (10-30 mg total) by mouth 3 (three) times daily as needed for anxiety., Disp: 60 tablet, Rfl: 0 .  oxyCODONE-acetaminophen (PERCOCET) 10-325 MG tablet, TK 1 T PO Q 6 H PRN P, Disp: , Rfl:  .  varenicline (CHANTIX STARTING MONTH PAK) 0.5 MG X 11 & 1 MG X 42 tablet, Take one 0.5 mg tablet by mouth once daily for 3 days, then increase to one 0.5 mg tablet twice  daily for 4 days, then increase to one 1 mg tablet twice daily. (Patient not taking: Reported on 06/11/2019), Disp: 53 tablet, Rfl: 0 Medication Side Effects: none  Family Medical/ Social History: Changes? No  MENTAL HEALTH EXAM:  There were no vitals taken for this visit.There is no height or weight on file to calculate BMI.  General Appearance: Casual, Neat and Well Groomed  Eye Contact:  Good  Speech:  Clear and Coherent  Volume:  Normal  Mood:  Euthymic  Affect:  Appropriate   Thought Process:  Goal Directed and Descriptions of Associations: Intact  Orientation:  Full (Time, Place, and Person)  Thought Content: Logical   Suicidal Thoughts:  No  Homicidal Thoughts:  No  Memory:  WNL  Judgement:  Good  Insight:  Good  Psychomotor Activity:  Normal  Concentration:  Concentration: Good  Recall:  Good  Fund of Knowledge: Good  Language: Good  Assets:  Desire for Improvement  ADL's:  Intact  Cognition: WNL  Prognosis:  Good    DIAGNOSES:    ICD-10-CM   1. Anxiety state  F41.1   2. Bipolar I disorder ([place])  F31.9   3. Attention deficit hyperactivity disorder (ADHD), unspecified ADHD type  F90.9     Receiving Psychotherapy: No    RECOMMENDATIONS:  PDMP was reviewed. I spent 30 minutes with her. We discussed different options for the anxiety.  Because she is on a stimulant, I cannot give a benzodiazepine for routine use.  However I will prescribe a small amount of Xanax for her to use in an emergency and she understands she has to make that prescription last for several months in the least.  Another option is to increase the Cymbalta, and another would be to add hydroxyzine.  That will help with the anxiety and sleep.  She would like to add the hydroxyzine and have a small amount of the benzo.  We discussed the risks benefits, and side effects of both the hydroxyzine and alprazolam and she accepts. Start hydroxyzine 10 mg, 1 to 3 pills 3 times daily as needed anxiety or sleep. Continue Cymbalta 60 mg, 1 p.o. daily. Continue Abilify 10 mg daily. Continue Adderall 30 mg, 1 p.o. every morning and 1 around lunch. Start Xanax 0.5 mg, 1/2-1 twice daily as needed.  Use very sparingly. Consider counseling. Return in 6 weeks.  Donnal Moat, PA-C

## 2020-01-01 ENCOUNTER — Other Ambulatory Visit: Payer: Self-pay | Admitting: Physician Assistant

## 2020-01-08 ENCOUNTER — Other Ambulatory Visit: Payer: Self-pay | Admitting: Physician Assistant

## 2020-01-08 NOTE — Telephone Encounter (Signed)
Has apt 05/18

## 2020-01-20 ENCOUNTER — Ambulatory Visit: Payer: Medicare Other | Admitting: Physician Assistant

## 2020-01-27 ENCOUNTER — Telehealth: Payer: Self-pay | Admitting: Physician Assistant

## 2020-01-27 ENCOUNTER — Other Ambulatory Visit: Payer: Self-pay | Admitting: Physician Assistant

## 2020-01-27 MED ORDER — ALPRAZOLAM 0.5 MG PO TABS: ORAL_TABLET | ORAL | 0 refills | Status: DC

## 2020-01-27 NOTE — Telephone Encounter (Signed)
Pt requesting refill for Alprazolam. Due for refill while out of town. Daughter had a baby and will be leaving around lunch tomorrow for a week. Next apt  6/11 [facility] [place]

## 2020-01-27 NOTE — Telephone Encounter (Signed)
Prescription was sent

## 2020-02-13 ENCOUNTER — Ambulatory Visit: Payer: Medicare Other | Admitting: Physician Assistant

## 2020-02-17 ENCOUNTER — Encounter: Payer: Self-pay | Admitting: Physician Assistant

## 2020-02-17 ENCOUNTER — Ambulatory Visit (INDEPENDENT_AMBULATORY_CARE_PROVIDER_SITE_OTHER): Payer: Medicare Other | Admitting: Physician Assistant

## 2020-02-17 ENCOUNTER — Other Ambulatory Visit: Payer: Self-pay

## 2020-02-17 VITALS — BP 121/80 | HR 88

## 2020-02-17 DIAGNOSIS — F909 Attention-deficit hyperactivity disorder, unspecified type: Secondary | ICD-10-CM

## 2020-02-17 DIAGNOSIS — F319 Bipolar disorder, unspecified: Secondary | ICD-10-CM | POA: Diagnosis not present

## 2020-02-17 DIAGNOSIS — F17200 Nicotine dependence, unspecified, uncomplicated: Secondary | ICD-10-CM

## 2020-02-17 MED ORDER — AMPHETAMINE-DEXTROAMPHETAMINE 30 MG PO TABS: 30.0000 mg | ORAL_TABLET | Freq: Two times a day (BID) | ORAL | 0 refills | Status: DC

## 2020-02-17 MED ORDER — CHANTIX STARTING MONTH PAK 0.5 MG X 11 & 1 MG X 42 PO TABS: ORAL_TABLET | ORAL | 0 refills | Status: DC

## 2020-02-17 MED ORDER — ALPRAZOLAM 0.5 MG PO TABS: ORAL_TABLET | ORAL | 0 refills | Status: DC

## 2020-02-17 MED ORDER — ARIPIPRAZOLE 10 MG PO TABS: ORAL_TABLET | ORAL | 1 refills | Status: DC

## 2020-02-17 MED ORDER — DULOXETINE HCL 60 MG PO CPEP: 60.0000 mg | ORAL_CAPSULE | Freq: Every day | ORAL | 1 refills | Status: DC

## 2020-02-17 NOTE — Progress Notes (Signed)
[facility]  Patient ID: Diana Alvarado,  MRN: [id number]  PCP: Default, Provider, MD  Date of Evaluation: 02/17/2020 Time spent:30 minutes  Chief Complaint:  Chief Complaint    Follow-up      HISTORY/CURRENT STATUS: HPI For routine med check.  Wants to quit smoking, feels like she is ready.  She is tried Chantix in the past but was not really serious about quitting so she would like to try it again.  Her other medications are working well.  She is able to enjoy things.  Energy and motivation are good.  Not crying easily.  Not isolating.  No SI/HI.  Patient denies increased energy with decreased need for sleep, no increased talkativeness, no racing thoughts, no impulsivity or risky behaviors, no increased spending, no increased libido, no grandiosity, no increased irritability or anger, and no hallucinations.  She is able to focus on things and get things done in a timely manner.  The Adderall helps that as well as her mood.  Individual Medical History/ Review of Systems: Changes? :No    Past medications for mental health diagnoses include: Xanax, Buspar wasn't effective, Cymbalta, Abilify, Chantix (only for a week)  Allergies: Aspirin, Codeine, and Sulfonamide derivatives  Current Medications:  Current Outpatient Medications:  .  albuterol (PROVENTIL HFA;VENTOLIN HFA) 108 (90 BASE) MCG/ACT inhaler, Inhale 2 puffs into the lungs every 6 (six) hours as needed for wheezing or shortness of breath., Disp: , Rfl:  .  ALPRAZolam (XANAX) 0.5 MG tablet, TAKE 1/2 TO 1 TABLET(0.25 TO 0.5 MG) BY MOUTH TWICE DAILY AS NEEDED FOR ANXIETY, Disp: 20 tablet, Rfl: 0 .  amphetamine-dextroamphetamine (ADDERALL) 30 MG tablet, Take 1 tablet by mouth 2 (two) times daily., Disp: 60 tablet, Rfl: 0 .  [START ON 05/17/2020] amphetamine-dextroamphetamine (ADDERALL) 30 MG tablet, Take 1 tablet by mouth 2 (two) times daily. 1 tab bid, Disp: 60 tablet, Rfl: 0 .  [START ON 04/17/2020]  amphetamine-dextroamphetamine (ADDERALL) 30 MG tablet, Take 1 tablet by mouth 2 (two) times daily., Disp: 60 tablet, Rfl: 0 .  [START ON 03/18/2020] amphetamine-dextroamphetamine (ADDERALL) 30 MG tablet, Take 1 tablet by mouth 2 (two) times daily., Disp: 60 tablet, Rfl: 0 .  ARIPiprazole (ABILIFY) 10 MG tablet, TAKE 1 TABLET(10 MG) BY MOUTH DAILY, Disp: 90 tablet, Rfl: 1 .  cyclobenzaprine (FLEXERIL) 10 MG tablet, TAKE 1 TABLET BY MOUTH EVERY 8 HOURS AS NEEDED FOR PAIN/SPASM, Disp: , Rfl: 0 .  DULoxetine (CYMBALTA) 60 MG capsule, Take 1 capsule (60 mg total) by mouth daily., Disp: 90 capsule, Rfl: 1 .  fentaNYL (DURAGESIC) 50 MCG/HR, 1 patch every other day., Disp: , Rfl:  .  hydrOXYzine (ATARAX/VISTARIL) 10 MG tablet, TAKE 1 TO 3 TABLETS(10 TO 30 MG) BY MOUTH THREE TIMES DAILY AS NEEDED FOR ANXIETY, Disp: 60 tablet, Rfl: 0 .  acetaminophen (TYLENOL) 500 MG tablet, Take 500 mg by mouth every 6 (six) hours as needed., Disp: , Rfl:  .  Buprenorphine 15 MCG/HR PTWK, buprenorphine 15 mcg/hour weekly transdermal patch (Patient not taking: Reported on 02/17/2020), Disp: , Rfl:  .  oxyCODONE-acetaminophen (PERCOCET) 10-325 MG tablet, TK 1 T PO Q 6 H PRN P (Patient not taking: Reported on 02/17/2020), Disp: , Rfl:  .  varenicline (CHANTIX STARTING MONTH PAK) 0.5 MG X 11 & 1 MG X 42 tablet, Take one 0.5 mg tablet by mouth once daily for 3 days, then increase to one 0.5 mg tablet twice daily for 4 days, then increase to one 1 mg tablet  twice daily., Disp: 53 tablet, Rfl: 0 Medication Side Effects: none  Family Medical/ Social History: Changes? No  MENTAL HEALTH EXAM:  Blood pressure 121/80, pulse 88.There is no height or weight on file to calculate BMI.  General Appearance: Casual, Neat and Well Groomed  Eye Contact:  Good  Speech:  Clear and Coherent  Volume:  Normal  Mood:  Euthymic  Affect:  Appropriate  Thought Process:  Goal Directed and Descriptions of Associations: Intact  Orientation:  Full  (Time, Place, and Person)  Thought Content: Logical   Suicidal Thoughts:  No  Homicidal Thoughts:  No  Memory:  WNL  Judgement:  Good  Insight:  Good  Psychomotor Activity:  Normal  Concentration:  Concentration: Good  Recall:  Good  Fund of Knowledge: Good  Language: Good  Assets:  Desire for Improvement  ADL's:  Intact  Cognition: WNL  Prognosis:  Good    DIAGNOSES:    ICD-10-CM   1. Bipolar I disorder ([place])  F31.9   2. Attention deficit hyperactivity disorder (ADHD), unspecified ADHD type  F90.9   3. Smoker  F17.200     Receiving Psychotherapy: No    RECOMMENDATIONS:  PDMP was reviewed. I spent 30 minutes with her. I am glad she is ready to quit smoking.  We discussed the benefits, risks, side effects of the Chantix and she accepts.  She will let me know if she has any problems with the medication.  Other things that may be helpful are other things that we will keep her busy with her hands like a craft or something.  And at least initially, avoiding people who smoke. Start Chantix.  First month supply was prescribed. Continue hydroxyzine 10 mg, 1 to 3 pills 3 times daily as needed anxiety or sleep. Continue Cymbalta 60 mg, 1 p.o. daily. Continue Abilify 10 mg daily. Continue Adderall 30 mg, 1 p.o. every morning and 1 around lunch. Continue Xanax 0.5 mg, 1/2-1 twice daily as needed.  Use very sparingly. Consider counseling. Return in 4 weeks.  Donnal Moat, PA-C

## 2020-02-22 ENCOUNTER — Other Ambulatory Visit: Payer: Self-pay | Admitting: Physician Assistant

## 2020-02-27 ENCOUNTER — Other Ambulatory Visit: Payer: Self-pay | Admitting: Physician Assistant

## 2020-02-27 NOTE — Telephone Encounter (Signed)
Next apt 07/23, not sure how frequent you want her refills?

## 2020-03-01 ENCOUNTER — Telehealth: Payer: Self-pay | Admitting: Physician Assistant

## 2020-03-01 NOTE — Telephone Encounter (Signed)
Patient called and said she needs a refill on her xanax.. It has been a rough two weeks and she needs more for July. [facility] to [facility] [address] and [address]

## 2020-03-01 NOTE — Telephone Encounter (Signed)
I'm going to deny this.  She needs to use the Hydroxyzine more often for anxiety, she can increase it to 30 mg tid prn. The Xanax is for emergencies only.  Qty of 20 needs to last one month.

## 2020-03-01 NOTE — Telephone Encounter (Signed)
Yes, so she must be taking them twice a day, or at least regularly qd.  She can't take it routinely and be on stimulant too. She has the hydroxyzine for prn anxiety. I'll give her #20 Xanax on the 15th if she needs it.

## 2020-03-02 NOTE — Telephone Encounter (Signed)
LM with information and to call back with further concerns or questions

## 2020-03-19 ENCOUNTER — Telehealth: Payer: Self-pay | Admitting: Physician Assistant

## 2020-03-19 NOTE — Telephone Encounter (Signed)
Pt requesting refill for Xanax

## 2020-03-19 NOTE — Telephone Encounter (Signed)
Pt requesting refill for Xanax. Same pharmacy

## 2020-03-22 ENCOUNTER — Other Ambulatory Visit: Payer: Self-pay

## 2020-03-22 MED ORDER — ALPRAZOLAM 0.5 MG PO TABS: ORAL_TABLET | ORAL | 0 refills | Status: DC

## 2020-03-22 NOTE — Telephone Encounter (Signed)
Last refill 02/17/2020 Pended for Helene Kelp to submit

## 2020-03-26 ENCOUNTER — Ambulatory Visit: Payer: Medicare Other | Admitting: Physician Assistant

## 2020-03-26 ENCOUNTER — Telehealth: Payer: Self-pay | Admitting: Physician Assistant

## 2020-03-26 NOTE — Telephone Encounter (Signed)
Diana Alvarado [birth year] 12[birth year]57 called to cancel appt as her brother died today. Will call to RS her appt.

## 2020-04-14 ENCOUNTER — Telehealth: Payer: Self-pay | Admitting: Physician Assistant

## 2020-04-14 NOTE — Telephone Encounter (Signed)
Pt called to ask for Adderall to be filled 8/13. Due 8/14. Going to daughters house and will be leaving from 8/14-8/21.

## 2020-04-16 ENCOUNTER — Telehealth: Payer: Self-pay | Admitting: Physician Assistant

## 2020-04-16 NOTE — Telephone Encounter (Signed)
I called and spoke with a pharmacist, okay to refill Adderall today.  Then called the patient and immediately got a voicemail that did not identify her as the patient.  I left message that I am from the doctor's office and okayed that prescription.  No specific details were given.

## 2020-04-16 NOTE — Telephone Encounter (Signed)
Diana Alvarado called on Wednesday asking if you could approve her picking up her Adderall today instead of tomorrow.  She is going out of town and leaves early in the morning so needs to get the medication today.  It is just one day early, but the pharmacy needs your approval to fill it today.  Please call to approved.  [facility] [address]

## 2020-04-20 ENCOUNTER — Ambulatory Visit: Payer: Medicare Other | Admitting: Physician Assistant

## 2020-04-26 ENCOUNTER — Other Ambulatory Visit: Payer: Self-pay | Admitting: Physician Assistant

## 2020-04-27 NOTE — Telephone Encounter (Signed)
Clarification 09/03

## 2020-04-27 NOTE — Telephone Encounter (Signed)
Next apt 09/23

## 2020-05-07 ENCOUNTER — Other Ambulatory Visit: Payer: Self-pay

## 2020-05-07 ENCOUNTER — Encounter: Payer: Self-pay | Admitting: Physician Assistant

## 2020-05-07 ENCOUNTER — Ambulatory Visit (INDEPENDENT_AMBULATORY_CARE_PROVIDER_SITE_OTHER): Payer: Medicare Other | Admitting: Physician Assistant

## 2020-05-07 VITALS — BP 121/84 | HR 84

## 2020-05-07 DIAGNOSIS — F319 Bipolar disorder, unspecified: Secondary | ICD-10-CM

## 2020-05-07 DIAGNOSIS — F411 Generalized anxiety disorder: Secondary | ICD-10-CM

## 2020-05-07 DIAGNOSIS — F909 Attention-deficit hyperactivity disorder, unspecified type: Secondary | ICD-10-CM

## 2020-05-07 DIAGNOSIS — F17200 Nicotine dependence, unspecified, uncomplicated: Secondary | ICD-10-CM

## 2020-05-07 MED ORDER — CHANTIX STARTING MONTH PAK 0.5 MG X 11 & 1 MG X 42 PO TABS: ORAL_TABLET | ORAL | 0 refills | Status: DC

## 2020-05-07 NOTE — Progress Notes (Signed)
[facility]  Patient ID: Diana Alvarado,  MRN: [id number]  PCP: Default, Provider, MD  Date of Evaluation: 05/07/2020 Time spent:20 minutes  Chief Complaint:  Chief Complaint    Follow-up      HISTORY/CURRENT STATUS: HPI For routine med check.  Is now down to 2 cigs/day.  Chantix has worked well but wasn't able to get back in for RF. Would like another starter pack.  No nausea or other SE.  Her other medications are working well.  She is able to enjoy things.  Energy and motivation are good.  Not crying easily. Not having a lot of anxiety but takes Xanax for emergencies. Like when she lost a brother and a sister since LOV.  Not isolating. Sleeps ok.  No SI/HI.  Patient denies increased energy with decreased need for sleep, no increased talkativeness, no racing thoughts, no impulsivity or risky behaviors, no increased spending, no increased libido, no grandiosity, no increased irritability or anger, and no hallucinations.  She is able to focus on things and get things done in a timely manner.  The Adderall helps that as well as her mood.  Individual Medical History/ Review of Systems: Changes? :No    Past medications for mental health diagnoses include: Xanax, Buspar wasn't effective, Cymbalta, Abilify, Chantix (only for a week)  Allergies: Aspirin, Codeine, and Sulfonamide derivatives  Current Medications:  Current Outpatient Medications:  .  acetaminophen (TYLENOL) 500 MG tablet, Take 500 mg by mouth every 6 (six) hours as needed., Disp: , Rfl:  .  albuterol (PROVENTIL HFA;VENTOLIN HFA) 108 (90 BASE) MCG/ACT inhaler, Inhale 2 puffs into the lungs every 6 (six) hours as needed for wheezing or shortness of breath., Disp: , Rfl:  .  ALPRAZolam (XANAX) 0.5 MG tablet, TAKE 1/2 TO 1 TABLET(0.25 TO 0.5 MG) BY MOUTH TWICE DAILY AS NEEDED FOR ANXIETY, Disp: 20 tablet, Rfl: 0 .  amphetamine-dextroamphetamine (ADDERALL) 30 MG tablet, Take 1 tablet by mouth 2 (two) times daily.,  Disp: 60 tablet, Rfl: 0 .  [START ON 05/17/2020] amphetamine-dextroamphetamine (ADDERALL) 30 MG tablet, Take 1 tablet by mouth 2 (two) times daily. 1 tab bid, Disp: 60 tablet, Rfl: 0 .  amphetamine-dextroamphetamine (ADDERALL) 30 MG tablet, Take 1 tablet by mouth 2 (two) times daily., Disp: 60 tablet, Rfl: 0 .  amphetamine-dextroamphetamine (ADDERALL) 30 MG tablet, Take 1 tablet by mouth 2 (two) times daily., Disp: 60 tablet, Rfl: 0 .  ARIPiprazole (ABILIFY) 10 MG tablet, TAKE 1 TABLET(10 MG) BY MOUTH DAILY, Disp: 90 tablet, Rfl: 1 .  cyclobenzaprine (FLEXERIL) 10 MG tablet, TAKE 1 TABLET BY MOUTH EVERY 8 HOURS AS NEEDED FOR PAIN/SPASM, Disp: , Rfl: 0 .  DULoxetine (CYMBALTA) 60 MG capsule, Take 1 capsule (60 mg total) by mouth daily., Disp: 90 capsule, Rfl: 1 .  fentaNYL (DURAGESIC) 50 MCG/HR, 1 patch every other day., Disp: , Rfl:  .  hydrOXYzine (ATARAX/VISTARIL) 10 MG tablet, TAKE 1 TO 3 TABLETS(10 TO 30 MG) BY MOUTH THREE TIMES DAILY AS NEEDED FOR ANXIETY, Disp: 60 tablet, Rfl: 0 .  Buprenorphine 15 MCG/HR PTWK, buprenorphine 15 mcg/hour weekly transdermal patch (Patient not taking: Reported on 02/17/2020), Disp: , Rfl:  .  oxyCODONE-acetaminophen (PERCOCET) 10-325 MG tablet, TK 1 T PO Q 6 H PRN P (Patient not taking: Reported on 02/17/2020), Disp: , Rfl:  .  varenicline (CHANTIX STARTING MONTH PAK) 0.5 MG X 11 & 1 MG X 42 tablet, Take one 0.5 mg tablet by mouth once daily for 3 days, then increase  to one 0.5 mg tablet twice daily for 4 days, then increase to one 1 mg tablet twice daily., Disp: 53 tablet, Rfl: 0 Medication Side Effects: none  Family Medical/ Social History: Changes? Sister died, in a nursing home.   Brother died from MI.   MENTAL HEALTH EXAM:  Blood pressure 121/84, pulse 84.There is no height or weight on file to calculate BMI.  General Appearance: Casual, Neat and Well Groomed  Eye Contact:  Good  Speech:  Clear and Coherent and Normal Rate  Volume:  Normal  Mood:   Euthymic  Affect:  Appropriate  Thought Process:  Goal Directed and Descriptions of Associations: Intact  Orientation:  Full (Time, Place, and Person)  Thought Content: Logical   Suicidal Thoughts:  No  Homicidal Thoughts:  No  Memory:  WNL  Judgement:  Good  Insight:  Good  Psychomotor Activity:  Normal  Concentration:  Concentration: Good and Attention Span: Good  Recall:  Good  Fund of Knowledge: Good  Language: Good  Assets:  Desire for Improvement  ADL's:  Intact  Cognition: WNL  Prognosis:  Good    DIAGNOSES:    ICD-10-CM   1. Bipolar I disorder ([place])  F31.9   2. Attention deficit hyperactivity disorder (ADHD), unspecified ADHD type  F90.9   3. Smoker  F17.200   4. Anxiety state  F41.1     Receiving Psychotherapy: No    RECOMMENDATIONS:  PDMP was reviewed. I provided 20 mins of face to face time during this encounter.  My condolences on the loss of her brother and sister. Re-tart Chantix.  First month supply was prescribed. Continue hydroxyzine 10 mg, 1 to 3 pills 3 times daily as needed anxiety or sleep. Continue Cymbalta 60 mg, 1 p.o. daily. Continue Abilify 10 mg daily. Continue Adderall 30 mg, 1 p.o. every morning and 1 around lunch. Continue Xanax 0.5 mg, 1/2-1 twice daily as needed.  Use very sparingly. Consider counseling. Return in 4 weeks.  Donnal Moat, PA-C

## 2020-05-13 ENCOUNTER — Other Ambulatory Visit: Payer: Self-pay | Admitting: Physician Assistant

## 2020-05-13 MED ORDER — VARENICLINE TARTRATE 0.5 MG PO TABS: ORAL_TABLET | ORAL | 0 refills | Status: DC

## 2020-05-14 ENCOUNTER — Other Ambulatory Visit: Payer: Self-pay | Admitting: Physician Assistant

## 2020-05-28 ENCOUNTER — Other Ambulatory Visit: Payer: Self-pay | Admitting: Physician Assistant

## 2020-06-01 NOTE — Telephone Encounter (Signed)
Next apt 10/15

## 2020-06-18 ENCOUNTER — Other Ambulatory Visit: Payer: Self-pay

## 2020-06-18 ENCOUNTER — Encounter: Payer: Self-pay | Admitting: Physician Assistant

## 2020-06-18 ENCOUNTER — Ambulatory Visit (INDEPENDENT_AMBULATORY_CARE_PROVIDER_SITE_OTHER): Payer: Medicare Other | Admitting: Physician Assistant

## 2020-06-18 VITALS — BP 118/83 | HR 89

## 2020-06-18 DIAGNOSIS — F909 Attention-deficit hyperactivity disorder, unspecified type: Secondary | ICD-10-CM

## 2020-06-18 DIAGNOSIS — F4321 Adjustment disorder with depressed mood: Secondary | ICD-10-CM

## 2020-06-18 DIAGNOSIS — F411 Generalized anxiety disorder: Secondary | ICD-10-CM

## 2020-06-18 DIAGNOSIS — F3170 Bipolar disorder, currently in remission, most recent episode unspecified: Secondary | ICD-10-CM

## 2020-06-18 DIAGNOSIS — F17200 Nicotine dependence, unspecified, uncomplicated: Secondary | ICD-10-CM | POA: Diagnosis not present

## 2020-06-18 MED ORDER — VARENICLINE TARTRATE 1 MG PO TABS: 1.0000 mg | ORAL_TABLET | Freq: Two times a day (BID) | ORAL | 1 refills | Status: DC

## 2020-06-18 MED ORDER — AMPHETAMINE-DEXTROAMPHETAMINE 30 MG PO TABS: 30.0000 mg | ORAL_TABLET | Freq: Two times a day (BID) | ORAL | 0 refills | Status: DC

## 2020-06-18 MED ORDER — ALPRAZOLAM 0.5 MG PO TABS: 0.2500 mg | ORAL_TABLET | Freq: Two times a day (BID) | ORAL | 0 refills | Status: DC | PRN

## 2020-06-18 MED ORDER — VARENICLINE TARTRATE 0.5 MG PO TABS: ORAL_TABLET | ORAL | 0 refills | Status: DC

## 2020-06-18 NOTE — Progress Notes (Signed)
[facility]  Patient ID: Diana Alvarado,  MRN: [id number]  PCP: Default, Provider, MD  Date of Evaluation: 06/18/2020 Time spent:30 minutes  Chief Complaint:  Chief Complaint    Anxiety; ADD; Depression; Insomnia      HISTORY/CURRENT STATUS: HPI For routine med check.  She is still only smoking a few cigarettes a day.  There has been a recall on brand name Chantix, possibly causes cancer, so she has not been taking it.  I thought I ordered generic at the last visit but I will double check and make sure that gets sent in today.  She really wants to try it again.  Her other medications are working well.  She is able to enjoy things.  Energy and motivation are good.  Not crying easily. Not having a lot of anxiety but takes Xanax for emergencies. Not isolating. Sleeps fairly well most of the time.  If she is having a rough day and has racing thoughts in the evening, she will take a Xanax to help her go to sleep.  She continues to grieve the loss of her brother and sister over the summer.  States her husband helps her when she gets really sad.  No SI/HI.  Patient denies increased energy with decreased need for sleep, no increased talkativeness, no racing thoughts, no impulsivity or risky behaviors, no increased spending, no increased libido, no grandiosity, no increased irritability or anger, no paranoia.  And no hallucinations.  She is able to focus on things and get things done in a timely manner.  The Adderall helps that as well as her mood.  Individual Medical History/ Review of Systems: Changes? :No    Past medications for mental health diagnoses include: Xanax, Buspar wasn't effective, Cymbalta, Abilify, Chantix (only for a week)  Allergies: Aspirin, Codeine, and Sulfonamide derivatives  Current Medications:  Current Outpatient Medications:  .  acetaminophen (TYLENOL) 500 MG tablet, Take 500 mg by mouth every 6 (six) hours as needed., Disp: , Rfl:  .  albuterol  (PROVENTIL HFA;VENTOLIN HFA) 108 (90 BASE) MCG/ACT inhaler, Inhale 2 puffs into the lungs every 6 (six) hours as needed for wheezing or shortness of breath., Disp: , Rfl:  .  ALPRAZolam (XANAX) 0.5 MG tablet, TAKE 1/2 TO 1 TABLET(0.25 TO 0.5 MG) BY MOUTH TWICE DAILY AS NEEDED FOR ANXIETY, Disp: 20 tablet, Rfl: 0 .  amphetamine-dextroamphetamine (ADDERALL) 30 MG tablet, Take 1 tablet by mouth 2 (two) times daily., Disp: 60 tablet, Rfl: 0 .  amphetamine-dextroamphetamine (ADDERALL) 30 MG tablet, Take 1 tablet by mouth 2 (two) times daily. 1 tab bid, Disp: 60 tablet, Rfl: 0 .  amphetamine-dextroamphetamine (ADDERALL) 30 MG tablet, Take 1 tablet by mouth 2 (two) times daily., Disp: 60 tablet, Rfl: 0 .  amphetamine-dextroamphetamine (ADDERALL) 30 MG tablet, Take 1 tablet by mouth 2 (two) times daily., Disp: 60 tablet, Rfl: 0 .  ARIPiprazole (ABILIFY) 10 MG tablet, TAKE 1 TABLET(10 MG) BY MOUTH DAILY, Disp: 90 tablet, Rfl: 1 .  DULoxetine (CYMBALTA) 60 MG capsule, TAKE 1 CAPSULE BY MOUTH EVERY DAY, Disp: 90 capsule, Rfl: 1 .  fentaNYL (DURAGESIC) 50 MCG/HR, 1 patch every other day., Disp: , Rfl:  .  oxyCODONE-acetaminophen (PERCOCET) 10-325 MG tablet, TK 1 T PO Q 6 H PRN P, Disp: , Rfl:  .  Buprenorphine 15 MCG/HR PTWK, buprenorphine 15 mcg/hour weekly transdermal patch (Patient not taking: Reported on 02/17/2020), Disp: , Rfl:  .  cyclobenzaprine (FLEXERIL) 10 MG tablet, TAKE 1 TABLET BY MOUTH EVERY  8 HOURS AS NEEDED FOR PAIN/SPASM (Patient not taking: Reported on 06/18/2020), Disp: , Rfl: 0 .  hydrOXYzine (ATARAX/VISTARIL) 10 MG tablet, TAKE 1 TO 3 TABLETS(10 TO 30 MG) BY MOUTH THREE TIMES DAILY AS NEEDED FOR ANXIETY (Patient not taking: Reported on 06/18/2020), Disp: 60 tablet, Rfl: 0 .  varenicline (CHANTIX STARTING MONTH PAK) 0.5 MG X 11 & 1 MG X 42 tablet, Take one 0.5 mg tablet by mouth once daily for 3 days, then increase to one 0.5 mg tablet twice daily for 4 days, then increase to one 1 mg tablet  twice daily. (Patient not taking: Reported on 06/18/2020), Disp: 53 tablet, Rfl: 0 .  varenicline (CHANTIX) 0.5 MG tablet, 1 p.o. daily for 3 days, 1 p.o. twice daily for 4 days, then 2 p.o. twice daily. (Patient not taking: Reported on 06/18/2020), Disp: 90 tablet, Rfl: 0 Medication Side Effects: none  Family Medical/ Social History: Changes?  No  MENTAL HEALTH EXAM:  Blood pressure 118/83, pulse 89.There is no height or weight on file to calculate BMI.  General Appearance: Casual, Neat and Well Groomed  Eye Contact:  Good  Speech:  Clear and Coherent and Normal Rate  Volume:  Normal  Mood:  Euthymic  Affect:  Appropriate  Thought Process:  Goal Directed and Descriptions of Associations: Intact  Orientation:  Full (Time, Place, and Person)  Thought Content: Logical   Suicidal Thoughts:  No  Homicidal Thoughts:  No  Memory:  WNL  Judgement:  Good  Insight:  Good  Psychomotor Activity:  Normal  Concentration:  Concentration: Good and Attention Span: Good  Recall:  Good  Fund of Knowledge: Good  Language: Good  Assets:  Desire for Improvement  ADL's:  Intact  Cognition: WNL  Prognosis:  Good    DIAGNOSES:    ICD-10-CM   1. Bipolar disorder in full remission, most recent episode unspecified type ([place])  F31.70   2. Grief  F43.21   3. Attention deficit hyperactivity disorder (ADHD), unspecified ADHD type  F90.9   4. Smoker  F17.200   5. Anxiety state  F41.1     Receiving Psychotherapy: No    RECOMMENDATIONS:  PDMP was reviewed. I provided 30 mins of face to face time during this encounter. Again counseled on smoking cessation. Sleep hygiene was again discussed. Re-start Chantix.  First month supply, generic was prescribed. Continue hydroxyzine 10 mg, 1 to 3 pills 3 times daily as needed anxiety or sleep.  This has helped with sleep in the past.  I have encouraged her to use it for that reason when needed and then she can use the Xanax for true anxiety. Continue  Cymbalta 60 mg, 1 p.o. daily. Continue Abilify 10 mg daily. Continue Adderall 30 mg, 1 p.o. every morning and 1 around lunch. Continue Xanax 0.5 mg, 1/2-1 twice daily as needed.  Use very sparingly. Consider counseling. Return in 3 months.  Donnal Moat, PA-C

## 2020-07-30 ENCOUNTER — Other Ambulatory Visit: Payer: Self-pay | Admitting: Physician Assistant

## 2020-07-31 ENCOUNTER — Other Ambulatory Visit: Payer: Self-pay | Admitting: Physician Assistant

## 2020-08-01 NOTE — Telephone Encounter (Signed)
Next apt 09/18/19

## 2020-08-10 ENCOUNTER — Other Ambulatory Visit: Payer: Self-pay | Admitting: Physician Assistant

## 2020-08-16 ENCOUNTER — Ambulatory Visit: Payer: Medicare Other | Admitting: Neurology

## 2020-08-16 ENCOUNTER — Encounter: Payer: Self-pay | Admitting: Neurology

## 2020-08-16 ENCOUNTER — Other Ambulatory Visit: Payer: Self-pay

## 2020-08-16 VITALS — BP 122/85 | HR 95 | Ht 61.0 in | Wt 164.0 lb

## 2020-08-16 DIAGNOSIS — I739 Peripheral vascular disease, unspecified: Secondary | ICD-10-CM

## 2020-08-16 DIAGNOSIS — F17210 Nicotine dependence, cigarettes, uncomplicated: Secondary | ICD-10-CM | POA: Diagnosis not present

## 2020-08-16 DIAGNOSIS — R200 Anesthesia of skin: Secondary | ICD-10-CM | POA: Diagnosis not present

## 2020-08-16 NOTE — Progress Notes (Signed)
[facility] Clinic Note - Initial Visit   Date: 08/16/20  Diana Alvarado MRN: [id number] DOB: 1956-02-13   Dear Dr. Nelva Bush:  Thank you for your kind referral of Diana Alvarado for consultation of bilateral feet pain. Although her history is well known to you, please allow [name] to reiterate it for the purpose of our medical record. The patient was accompanied to the clinic by self.   History of Present Illness: Diana Alvarado is a 64 y.o. left-handed female with anxiety/depression, chronic pain syndrome, tobacco use, and bipolar disorder presenting for evaluation of bilateral feet pain.   Starting around October 2021, she began having numbness and achy pain in the feet, which is aggravated by walking.  She does not have tingling. She has difficulty with any exertion such walking her dogs or doing household chores.  She was in [place] recently with her daughter and recalls having to stop to take breaks frequently, or even just rest each leg individually by balance on each one.  She gets relief with rest, which resolves discomfort within 3-5 minutes.  She denies imbalance and walks unassisted.  She denies worsening low back pain with exertion. No tingling.   She has chronic pain from low back pain and left hip arthritis.  She has been smoking 1 PPD since the age of 29.  She is trying to quit and has Chantix.  No prior vascular studies have been performed.   Out-side paper records, electronic medical record, and images have been reviewed where available and summarized as:  No results found for: HGBA1C Lab Results  Component Value Date   VITAMINB12 398 03/26/2018   Lab Results  Component Value Date   TSH 1.12 04/19/2012   Lab Results  Component Value Date   ESRSEDRATE 36 03/26/2018    Past Medical History:  Diagnosis Date  . ADHD   . Anxiety 11/17/2011  . Asthma 04/19/2011  . Chronic LBP 04/19/2011  . Depression 04/19/2011  . DJD (degenerative joint disease), cervical  04/19/2011  . Lumbar disc disease 04/19/2011  . UTI (lower urinary tract infection)     Past Surgical History:  Procedure Laterality Date  . ABDOMINAL HYSTERECTOMY  1997  . gall bladder  2005  . NECK SURGERY  1995  . ruptured disc  1992   repaired ruptured disc, neck     Medications:  Outpatient Encounter Medications as of 08/16/2020  Medication Sig  . acetaminophen (TYLENOL) 500 MG tablet Take 500 mg by mouth every 6 (six) hours as needed.  Marland Kitchen albuterol (PROVENTIL HFA;VENTOLIN HFA) 108 (90 BASE) MCG/ACT inhaler Inhale 2 puffs into the lungs every 6 (six) hours as needed for wheezing or shortness of breath.  . ALPRAZolam (XANAX) 0.5 MG tablet TAKE 1/2 TO 1 TABLET(0.25 TO 0.5 MG) BY MOUTH TWICE DAILY AS NEEDED FOR ANXIETY. MUST LAST 30 DAYS.  Marland Kitchen amphetamine-dextroamphetamine (ADDERALL) 30 MG tablet Take 1 tablet by mouth 2 (two) times daily.  . ARIPiprazole (ABILIFY) 10 MG tablet TAKE 1 TABLET(10 MG) BY MOUTH DAILY  . DULoxetine (CYMBALTA) 60 MG capsule TAKE 1 CAPSULE BY MOUTH EVERY DAY  . oxyCODONE-acetaminophen (PERCOCET) 10-325 MG tablet TK 1 T PO Q 6 H PRN P  . amphetamine-dextroamphetamine (ADDERALL) 30 MG tablet Take 1 tablet by mouth 2 (two) times daily. (Patient not taking: Reported on 08/16/2020)  . amphetamine-dextroamphetamine (ADDERALL) 30 MG tablet Take 1 tablet by mouth 2 (two) times daily. (Patient not taking: Reported on 08/16/2020)  . Buprenorphine 15 MCG/HR PTWK buprenorphine  15 mcg/hour weekly transdermal patch (Patient not taking: Reported on 08/16/2020)  . cyclobenzaprine (FLEXERIL) 10 MG tablet TAKE 1 TABLET BY MOUTH EVERY 8 HOURS AS NEEDED FOR PAIN/SPASM (Patient not taking: No sig reported)  . fentaNYL (DURAGESIC) 50 MCG/HR 1 patch every other day. (Patient not taking: Reported on 08/16/2020)  . hydrOXYzine (ATARAX/VISTARIL) 10 MG tablet TAKE 1 TO 3 TABLETS(10 TO 30 MG) BY MOUTH THREE TIMES DAILY AS NEEDED FOR ANXIETY (Patient not taking: Reported on 08/16/2020)  .  varenicline (CHANTIX) 0.5 MG tablet 1 p.o. daily for 2 days, then 1 p.o. twice daily until gone.  Then she will take the 1 mg prescription (Patient not taking: Reported on 08/16/2020)  . varenicline (CHANTIX) 1 MG tablet Take 1 tablet (1 mg total) by mouth 2 (two) times daily. Start after completing the 2 weeks of 0.5 mg. (Patient not taking: Reported on 08/16/2020)   No facility-administered encounter medications on file as of 08/16/2020.    Allergies:  Allergies  Allergen Reactions  . Aspirin     REACTION: hives  . Codeine   . Sulfonamide Derivatives     REACTION: hives    Family History: Family History  Problem Relation Age of Onset  . Arthritis Other   . Cancer Other        cancer  . Diabetes Other   . Colon cancer Maternal Aunt   . Esophageal cancer Neg Hx   . Rectal cancer Neg Hx   . Stomach cancer Neg Hx     Social History: Social History   Tobacco Use  . Smoking status: Current Every Day Smoker    Packs/day: 0.10    Years: 10.00    Pack years: 1.00    Types: Cigarettes  . Smokeless tobacco: Never Used  Vaping Use  . Vaping Use: Never used  Substance Use Topics  . Alcohol use: No  . Drug use: No   Social History   Social History Narrative   Left Handed   Lives in a two story home. Never really gores upstairs.   Drinks Caffeine     Vital Signs:  BP 122/85   Pulse 95   Ht 5\' 1"  (1.549 m)   Wt 164 lb (74.4 kg)   SpO2 98%   BMI 30.99 kg/m   Neurological Exam: MENTAL STATUS including orientation to time, place, person, recent and remote memory, attention span and concentration, language, and fund of knowledge is normal.  Speech is not dysarthric.  CRANIAL NERVES: II:  No visual field defects.  III-IV-VI: Pupils equal round and reactive to light.  Normal conjugate, extra-ocular eye movements in all directions of gaze.  No nystagmus.  No ptosis.   V:  Normal facial sensation.    VII:  Normal facial symmetry and movements.   VIII:  Normal hearing  and vestibular function.   IX-X:  Normal palatal movement.   XI:  Normal shoulder shrug and head rotation.   XII:  Normal tongue strength and range of motion, no deviation or fasciculation.  MOTOR:  No atrophy, fasciculations or abnormal movements.  No pronator drift.   Upper Extremity:  Right  Left  Deltoid  5/5   5/5   Biceps  5/5   5/5   Triceps  5/5   5/5   Infraspinatus 5/5  5/5  Medial pectoralis 5/5  5/5  Wrist extensors  5/5   5/5   Wrist flexors  5/5   5/5   Finger extensors  5/5  5/5   Finger flexors  5/5   5/5   Dorsal interossei  5/5   5/5   Abductor pollicis  5/5   5/5   Tone (Ashworth scale)  0  0   Lower Extremity:  Right  Left  Hip flexors  5/5   5/5   Hip extensors  5/5   5/5   Adductor 5/5  5/5  Abductor 5/5  5/5  Knee flexors  5/5   5/5   Knee extensors  5/5   5/5   Dorsiflexors  5/5   5/5   Plantarflexors  5/5   5/5   Toe extensors  5/5   5/5   Toe flexors  5/5   5/5   Tone (Ashworth scale)  0  0   MSRs:  Right        Left                  brachioradialis 2+  2+  biceps 2+  2+  triceps 2+  2+  patellar 2+  2+  ankle jerk 2+  2+  Hoffman no  no  plantar response down  down   SENSORY:  Normal and symmetric perception of light touch, pinprick, vibration, and proprioception.  Romberg's sign absent.   COORDINATION/GAIT: Normal finger-to- nose-finger and heel-to-shin.  Intact rapid alternating movements bilaterally.  Gait narrow based and stable. Tandem and stressed gait intact.    IMPRESSION: 1. Claudication of both feet, with her tobacco history and exertional feet pain, need to evaluate for vascular claudication.  Her exam and history is not suggestive of neuropathy (distal sensory and reflexes are normal) or neurogenic claudication (lacks exertional low back pain or leg heaviness)  - Check ABI  - If negative, the next step is MRI lumbar spine  2. Tobacco cessation counseling  - Currently smoking 1 packs/day    - Patient was informed of the  dangers of tobacco abuse including stroke, cancer, and MI, as well as benefits of tobacco cessation.  - Patient is trying to quit at this time.  - Approximately 5 mins were spent counseling patient cessation techniques. We discussed various methods to help quit smoking, including deciding on a date to quit, joining a support group, pharmacological agents- nicotine gum/patch/lozenges, chantix.   - I will reassess her progress at the next follow-up visit  Further recommendations pending results.    Thank you for allowing me to participate in patient's care.  If I can answer any additional questions, I would be pleased to do so.    Sincerely,    Diana Hollett K. Posey Pronto, DO

## 2020-08-16 NOTE — Patient Instructions (Addendum)
We will order testing looking at your blood circulation in the legs and feet

## 2020-08-17 ENCOUNTER — Telehealth: Payer: Self-pay

## 2020-08-17 NOTE — Telephone Encounter (Signed)
With exercise.

## 2020-08-17 NOTE — Telephone Encounter (Signed)
Diana Alvarado from [facility] [phone] called and left a message in reagrds to patients ABI ordered. Diana Alvarado would like to know if patient's exam should be done w/ or w/o exercise.

## 2020-08-18 NOTE — Telephone Encounter (Signed)
Called Erica at [facility] and informed her that Dr. Posey Pronto would like testing to be done with Exercise. Danae Chen will send order to Dr. Posey Pronto for approval.

## 2020-08-30 ENCOUNTER — Telehealth: Payer: Self-pay

## 2020-08-30 ENCOUNTER — Ambulatory Visit
Admission: RE | Admit: 2020-08-30 | Discharge: 2020-08-30 | Disposition: A | Discharge status: Home / Self Care | Payer: Medicare Other | Source: Ambulatory Visit | Attending: Neurology | Admitting: Neurology

## 2020-08-30 DIAGNOSIS — R200 Anesthesia of skin: Secondary | ICD-10-CM

## 2020-08-30 DIAGNOSIS — M4807 Spinal stenosis, lumbosacral region: Secondary | ICD-10-CM

## 2020-08-30 DIAGNOSIS — I739 Peripheral vascular disease, unspecified: Secondary | ICD-10-CM

## 2020-08-30 NOTE — Telephone Encounter (Signed)
-----   Message from Donika K Patel, DO sent at 08/30/2020 10:52 AM EST ----- Please let pt  now her blood flow to the legs is normal.  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 Thanks. 

## 2020-08-30 NOTE — Telephone Encounter (Signed)
Patient returned call and was informed of Results. Patient is ok with MRI lumbar spine.

## 2020-08-30 NOTE — Telephone Encounter (Signed)
-----   Message from Alda Berthold, DO sent at 08/30/2020 10:52 AM EST ----- Please let pt  now her blood flow to the legs is normal.  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 Thanks.

## 2020-08-30 NOTE — Telephone Encounter (Signed)
Called patient and left a voicemail for a call back

## 2020-08-30 NOTE — Telephone Encounter (Signed)
-----   Message from Glendale Chard, DO sent at 08/30/2020 10:52 AM EST ----- Please let pt  now her blood flow to the legs is normal.  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 Thanks.

## 2020-09-06 ENCOUNTER — Other Ambulatory Visit: Payer: Self-pay | Admitting: Physician Assistant

## 2020-09-16 ENCOUNTER — Telehealth: Payer: Self-pay | Admitting: Physician Assistant

## 2020-09-16 NOTE — Telephone Encounter (Signed)
Next appt 09/23/20. Going to the beach today at 2 pm. Requesting Adderall 30 mg. Call to [facility]

## 2020-09-17 ENCOUNTER — Telehealth: Payer: Self-pay | Admitting: Physician Assistant

## 2020-09-17 ENCOUNTER — Ambulatory Visit: Payer: Medicare Other | Admitting: Physician Assistant

## 2020-09-17 ENCOUNTER — Other Ambulatory Visit: Payer: Self-pay | Admitting: Physician Assistant

## 2020-09-17 MED ORDER — AMPHETAMINE-DEXTROAMPHETAMINE 30 MG PO TABS: 30.0000 mg | ORAL_TABLET | Freq: Two times a day (BID) | ORAL | 0 refills | Status: DC

## 2020-09-17 NOTE — Telephone Encounter (Signed)
Prescription was sent

## 2020-09-17 NOTE — Telephone Encounter (Signed)
Pt would like a refill on Adderall. Please send to [facility] on [address]. Pt leaving beach in the morning.

## 2020-09-23 ENCOUNTER — Ambulatory Visit: Payer: Medicare Other | Admitting: Physician Assistant

## 2020-09-24 ENCOUNTER — Ambulatory Visit
Admission: RE | Admit: 2020-09-24 | Discharge: 2020-09-24 | Disposition: A | Discharge status: Home / Self Care | Payer: Medicare Other | Source: Ambulatory Visit | Attending: Neurology | Admitting: Neurology

## 2020-09-24 ENCOUNTER — Other Ambulatory Visit: Payer: Self-pay

## 2020-09-24 DIAGNOSIS — M4807 Spinal stenosis, lumbosacral region: Secondary | ICD-10-CM

## 2020-09-24 DIAGNOSIS — I739 Peripheral vascular disease, unspecified: Secondary | ICD-10-CM

## 2020-09-24 IMAGING — MR MR LUMBAR SPINE W/O CM
4 of 5 series · 27 of 48 positions shown · non-contrast
Comparison: Previous MRI from [DATE].

CLINICAL DATA: Initial evaluation for neurogenic claudication due
to spinal stenosis, low back pain with left lower extremity pain.

EXAM:
MRI LUMBAR SPINE WITHOUT CONTRAST
TECHNIQUE: Multiplanar, multisequence MR imaging of the lumbar spine was
performed. No intravenous contrast was administered.

[Series 5: T2 · sagittal · 4.0mm · 0.73mm/px · 6 of 15 slices shown (1 of 2)]
[im 1/15]
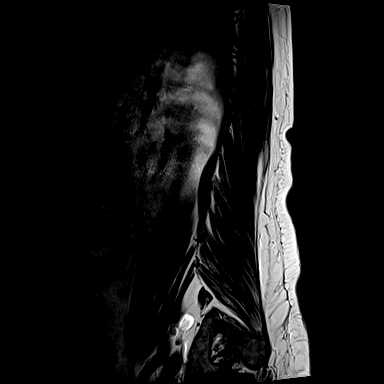
[im 3/15]
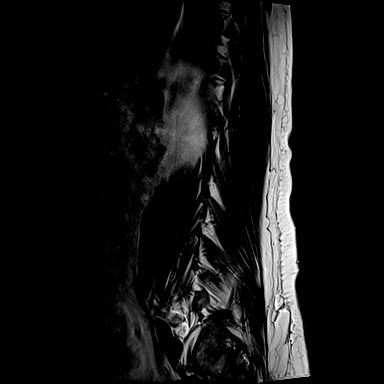
[im 6/15]
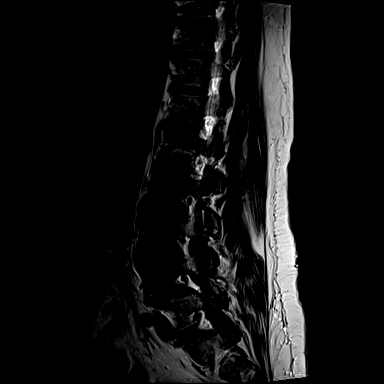
[im 9/15]
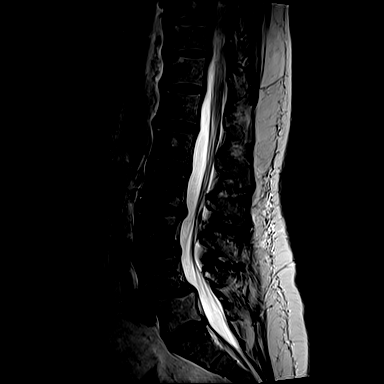
[im 12/15]
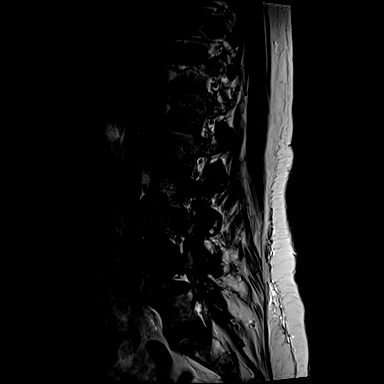
[im 15/15]
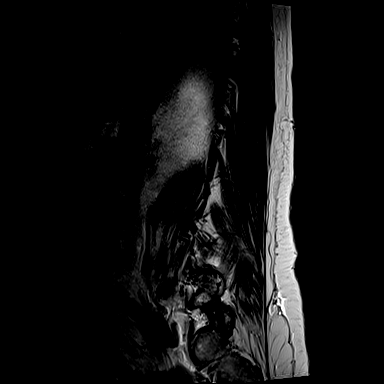

[Series 6: T1 · sagittal · 4.0mm · 1.09mm/px · 6 of 15 slices shown (1 of 2)]
[im 1/15]
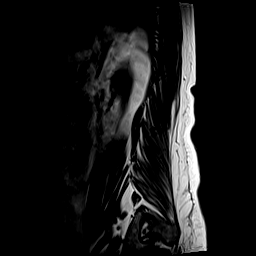
[im 3/15]
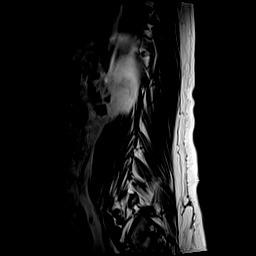
[im 6/15]
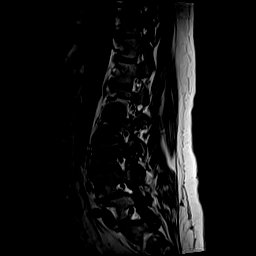
[im 9/15]
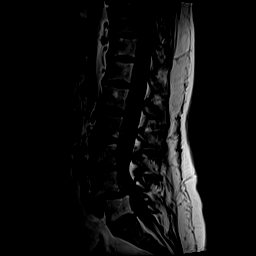
[im 12/15]
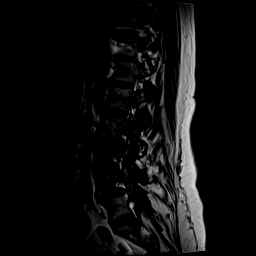
[im 15/15]
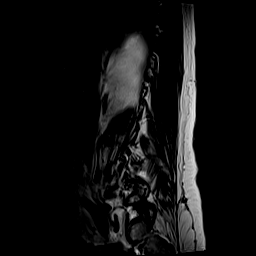

[Series 10: T1 · axial · 4.0mm · 0.28mm/px · z∈[-74,+82]mm · 6 of 39 slices shown (2 of 2)]
[im 1/39]
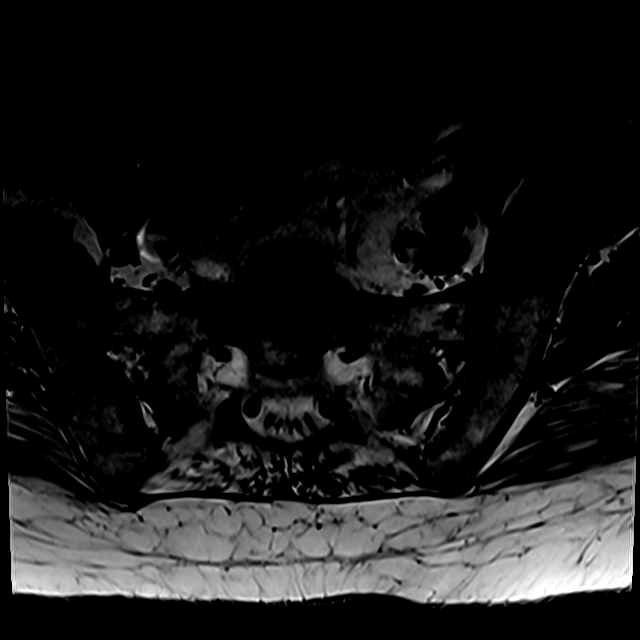
[im 6/39]
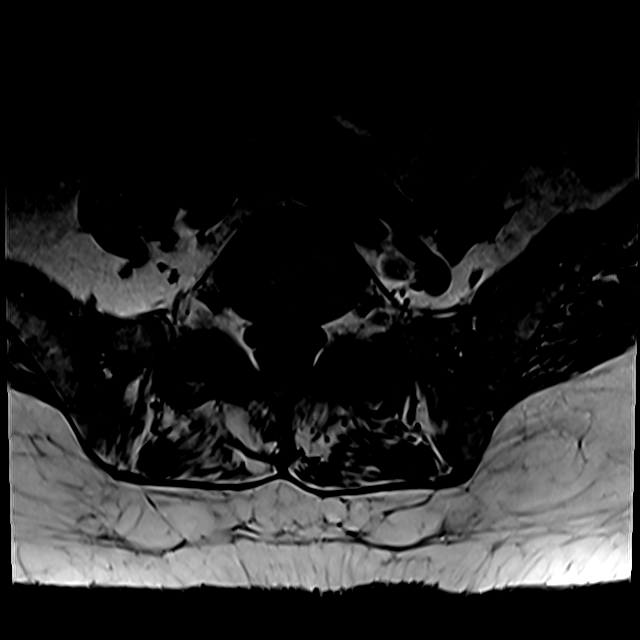
[im 11/39]
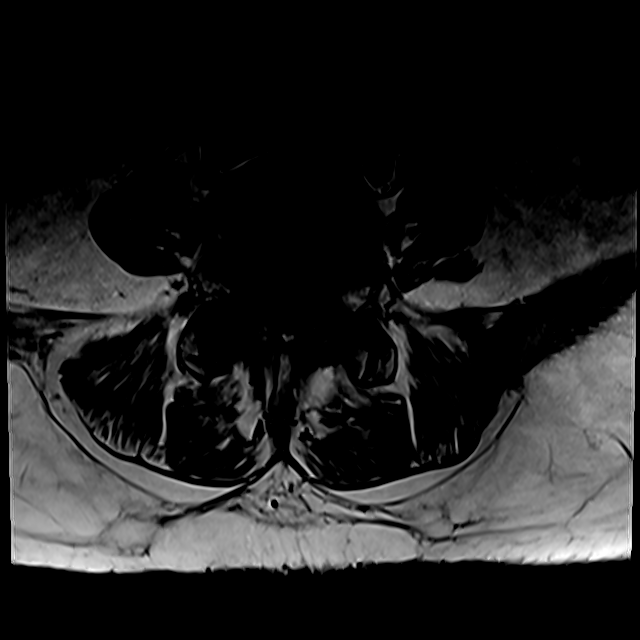
[im 17/39]
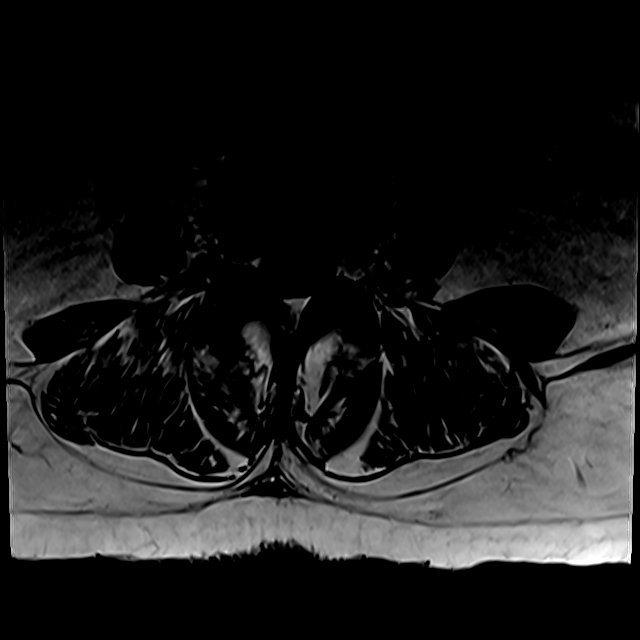
[im 20/39]
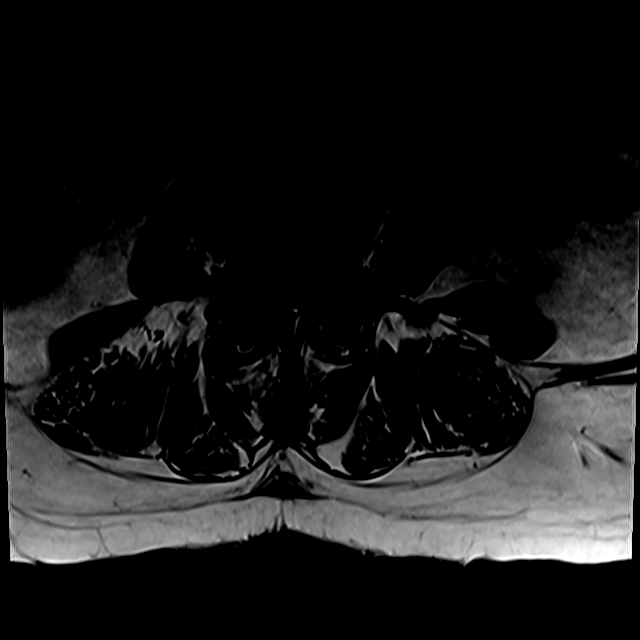
[im 33/39]
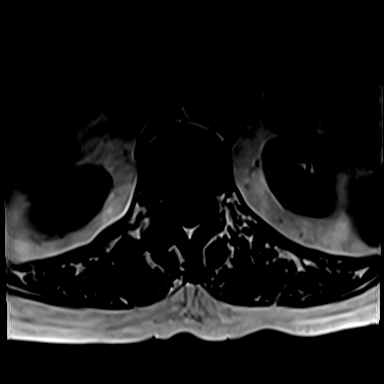

[Series 13: T2 · axial · 4.0mm · 0.56mm/px · z∈[-74,+112]mm · 9 of 39 slices shown (2 of 2)]
[im 1/39]
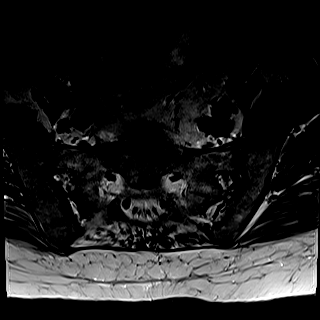
[im 6/39]
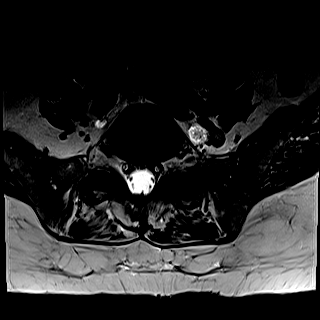
[im 11/39]
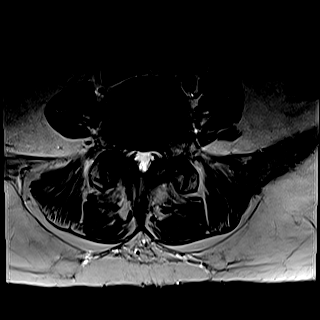
[im 17/39]
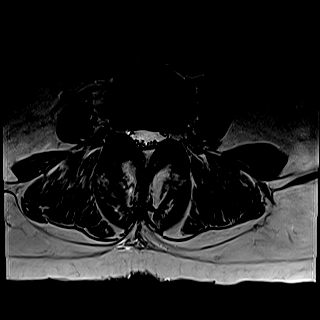
[im 20/39]
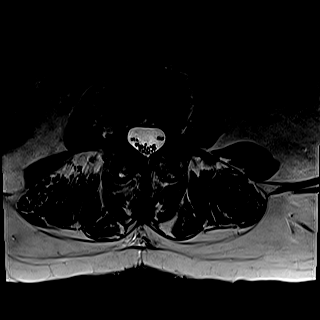
[im 22/39]
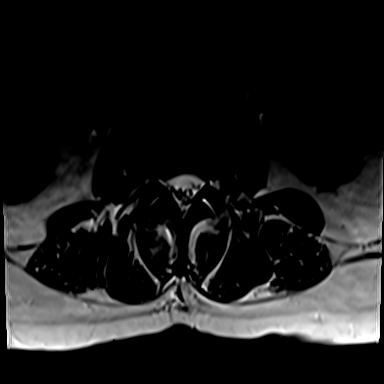
[im 28/39]
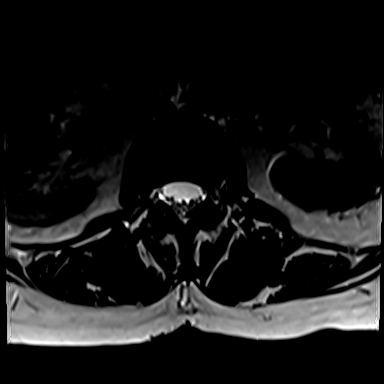
[im 33/39]
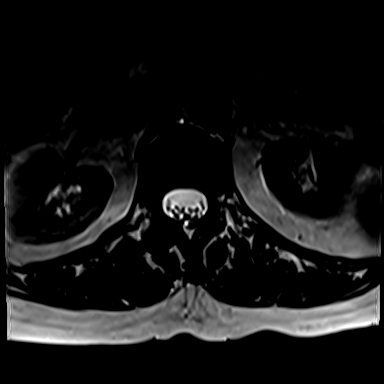
[im 39/39]
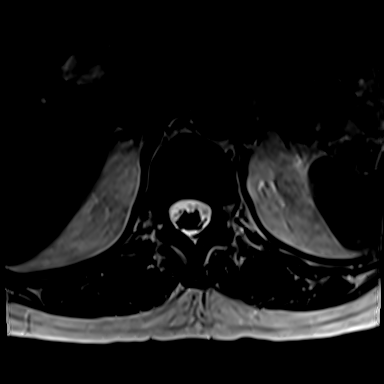

[27 of 48 positions shown; findings below may reference images not displayed]

FINDINGS: Segmentation: Standard. Lowest well-formed disc space labeled the
L5-S1 level.

Alignment: 9 mm anterolisthesis of L4 on L5, chronic and facet
mediated, and similar to previous. Underlying trace levoscoliosis.
Alignment otherwise normal preservation of the normal lumbar
lordosis.

Vertebrae: Vertebral body height maintained without acute or chronic
fracture. Bone marrow signal intensity within normal limits. Few
scattered benign hemangiomata noted, most prominent of which seen at
the T11 and L2 vertebral bodies. No worrisome osseous lesions. No
abnormal marrow edema.

Conus medullaris and cauda equina: Conus extends to the L1 level.
Conus and cauda equina appear normal.

Paraspinal and other soft tissues: Paraspinous soft tissues within
normal limits. Visualized visceral structures are normal.

Disc levels:

T10-11: Mild disc bulge with disc desiccation.  No stenosis.

T11-12: Minimal disc bulge and facet hypertrophy.  No stenosis.

T12-L1: Minimal disc bulge and facet hypertrophy.  No stenosis.

L1-2:  Unremarkable.

L2-3: Diffuse disc bulge with disc desiccation and intervertebral
disc space narrowing. Mild reactive endplate change with marginal
endplate osteophytic spurring. Superimposed shallow right
subarticular disc protrusion mildly indents the right ventral thecal
sac (series 13, image 18). Mild to moderate bilateral facet
hypertrophy, slightly worse on the right. Associated joint
effusions. Resultant mild narrowing of the right lateral recess,
descending right L3 nerve root level. Central canal remains patent.
Mild right foraminal stenosis. Left neural foramina remains patent.

L3-4: Mild diffuse disc bulge with disc desiccation. Small right
foraminal disc protrusion closely approximates the exiting right L3
nerve root without frank impingement (series 13, image 23). Moderate
to advanced right worse than left facet hypertrophy. No spinal
stenosis. Mild right foraminal narrowing. Left neural foramina
remains patent.

L4-5: 9 mm anterolisthesis. Degenerative intervertebral disc space
narrowing with disc desiccation and broad-based posterior pseudo
disc bulge. Severe bilateral facet arthrosis, slightly worse on the
right. Resultant mild canal with moderate bilateral subarticular
stenosis. Mild bilateral foraminal narrowing.

L5-S1: Minimal disc bulge. Severe bilateral facet arthrosis,
slightly worse on the left. No canal or lateral recess stenosis.
Foramina remain patent.
IMPRESSION: 1. 9 mm anterolisthesis of L4 on L5 with associated disc bulge and
severe facet arthropathy, resulting in moderate bilateral
subarticular stenosis, with mild bilateral foraminal narrowing.
2. Small right foraminal disc protrusion at L3-4, closely
approximating and potentially irritating the exiting right L3 nerve
root.
3. Disc bulging with right subarticular disc protrusion at L2-3 with
resultant mild right lateral recess and foraminal stenosis.
4. Moderate to advanced bilateral facet arthrosis at L2-3 through
L5-S1, which could contribute to lower back pain.

## 2020-09-28 ENCOUNTER — Other Ambulatory Visit: Payer: Self-pay | Admitting: Physician Assistant

## 2020-09-29 ENCOUNTER — Other Ambulatory Visit: Payer: Self-pay | Admitting: Physician Assistant

## 2020-09-30 ENCOUNTER — Other Ambulatory Visit: Payer: Self-pay | Admitting: Physician Assistant

## 2020-09-30 ENCOUNTER — Telehealth: Payer: Self-pay | Admitting: Physician Assistant

## 2020-09-30 MED ORDER — ALPRAZOLAM 0.5 MG PO TABS: ORAL_TABLET | ORAL | 0 refills | Status: DC

## 2020-09-30 NOTE — Telephone Encounter (Signed)
Prescription was sent in but not to be filled before to 10/24/2020.  Patient knows her prescription is supposed to last 30 days.

## 2020-09-30 NOTE — Telephone Encounter (Signed)
Pt requesting refill for Xanax @ [facility] [address] on file. [address]T [address]

## 2020-10-01 ENCOUNTER — Telehealth: Payer: Self-pay | Admitting: Neurology

## 2020-10-01 DIAGNOSIS — G959 Disease of spinal cord, unspecified: Secondary | ICD-10-CM

## 2020-10-01 NOTE — Telephone Encounter (Signed)
Patient called in and left message with Access Nurse to get MRI results.

## 2020-10-01 NOTE — Telephone Encounter (Signed)
Discussed results of MRI with patient which shows degeneratives changes in the lumbar spine, but would not explain her exertional leg weakness as there is no spinal stenosis.  I will check MRI cervical and thoracic spine wo contrast to see if there is pathology higher in the cord.  She would like to hold off on doing PT until after imaging has been done.

## 2020-10-15 ENCOUNTER — Ambulatory Visit: Payer: Self-pay | Admitting: Student

## 2020-10-18 ENCOUNTER — Telehealth: Payer: Self-pay | Admitting: Physician Assistant

## 2020-10-18 ENCOUNTER — Other Ambulatory Visit: Payer: Self-pay | Admitting: Physician Assistant

## 2020-10-18 MED ORDER — AMPHETAMINE-DEXTROAMPHETAMINE 30 MG PO TABS: 30.0000 mg | ORAL_TABLET | Freq: Two times a day (BID) | ORAL | 0 refills | Status: DC

## 2020-10-18 NOTE — Telephone Encounter (Signed)
Prescription was sent

## 2020-10-18 NOTE — Telephone Encounter (Signed)
Patient called in for refill for Adderall. Pharmacy [facility] on [address].

## 2020-10-24 ENCOUNTER — Ambulatory Visit
Admission: RE | Admit: 2020-10-24 | Discharge: 2020-10-24 | Disposition: A | Discharge status: Home / Self Care | Payer: Medicare Other | Source: Ambulatory Visit | Attending: Neurology | Admitting: Neurology

## 2020-10-24 ENCOUNTER — Other Ambulatory Visit: Payer: Self-pay

## 2020-10-24 DIAGNOSIS — G959 Disease of spinal cord, unspecified: Secondary | ICD-10-CM

## 2020-10-24 IMAGING — MR MR THORACIC SPINE W/O CM
4 of 6 series · 24 of 48 positions shown · non-contrast
Comparison: None.

CLINICAL DATA: Lower extremity pain and weakness worse on the left.

EXAM:
MRI THORACIC SPINE WITHOUT CONTRAST
TECHNIQUE: Multiplanar, multisequence MR imaging of the thoracic spine was
performed. No intravenous contrast was administered.

[Series 2: T2 · sagittal · 5.0mm · 1.56mm/px · 7 of 28 slices shown (1 of 2)]
[im 1/28]
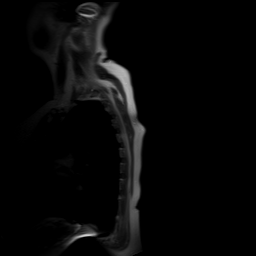
[im 5/28]
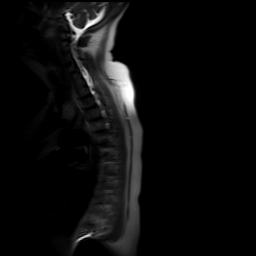
[im 10/28]
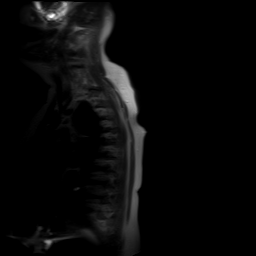
[im 14/28]
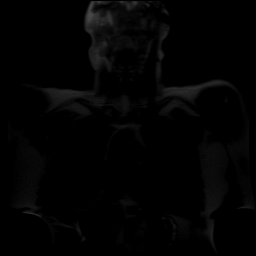
[im 19/28]
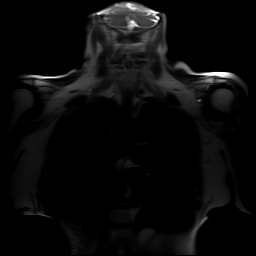
[im 23/28]
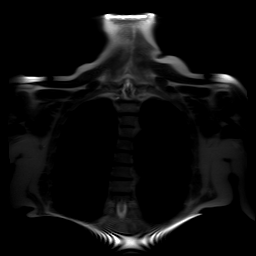
[im 28/28]
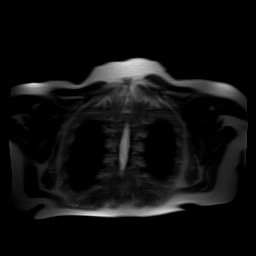

[Series 5: T2 post-contrast · sagittal · 3.0mm · 0.68mm/px · 5 of 19 slices shown]
[im 1/19]
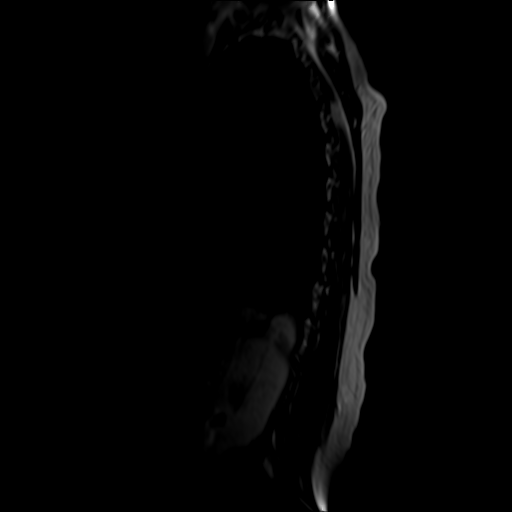
[im 5/19]
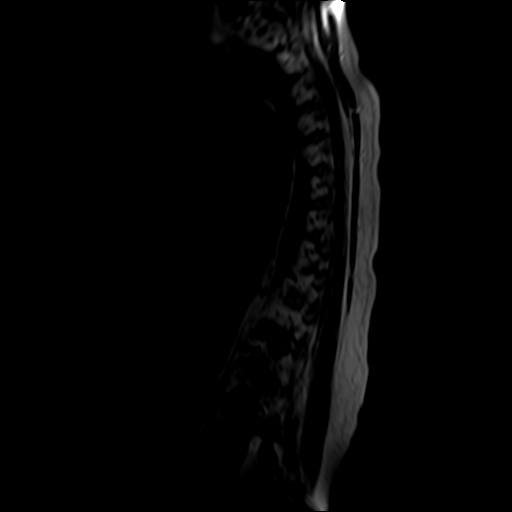
[im 10/19]
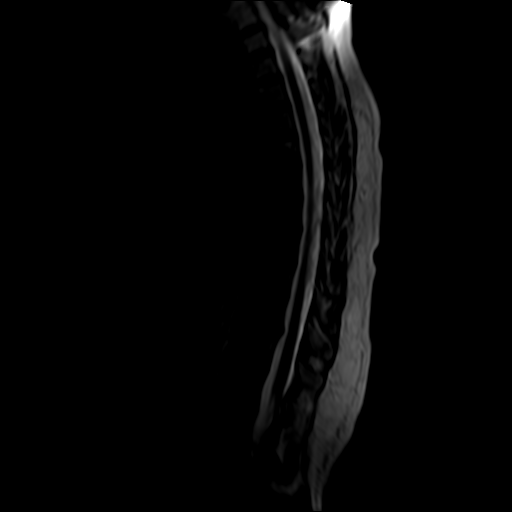
[im 14/19]
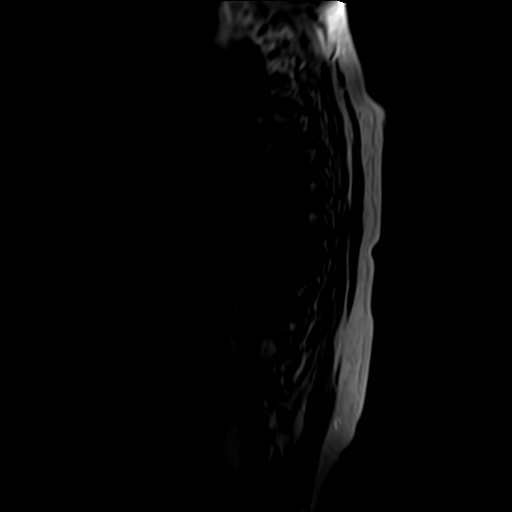
[im 19/19]
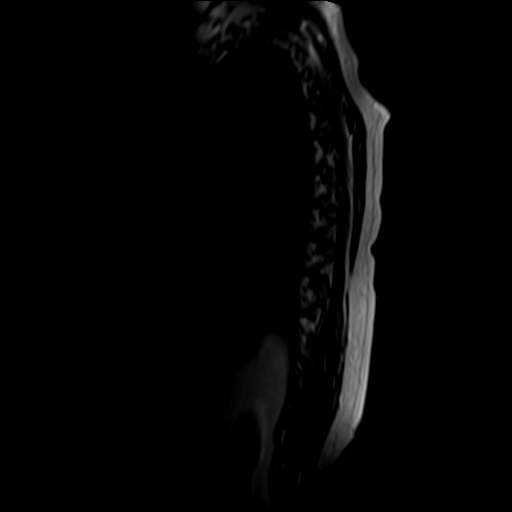

[Series 6: T1 · sagittal · 3.0mm · 0.68mm/px · 3 of 19 slices shown]
[im 1/19]
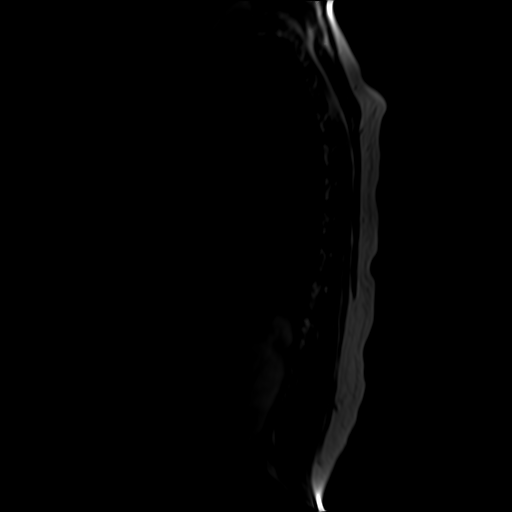
[im 10/19]
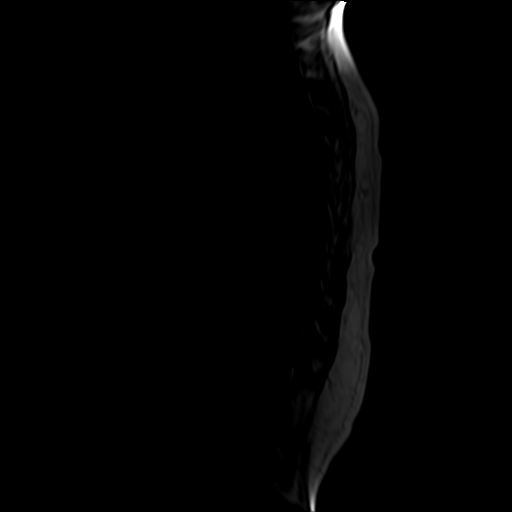
[im 19/19]
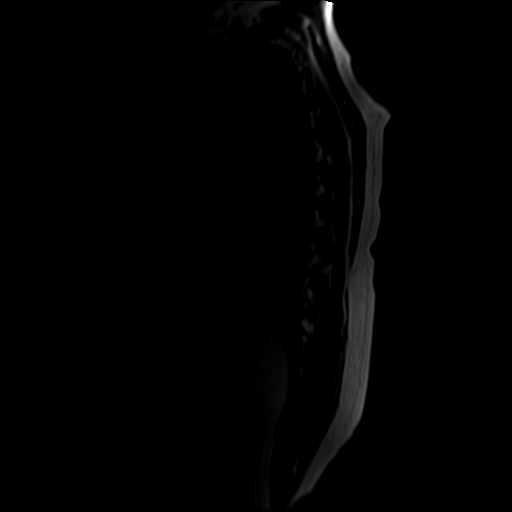

[Series 7: T2 · axial · 4.0mm · 0.39mm/px · z∈[-322,-48]mm · 9 of 51 slices shown (2 of 2)]
[im 1/51]
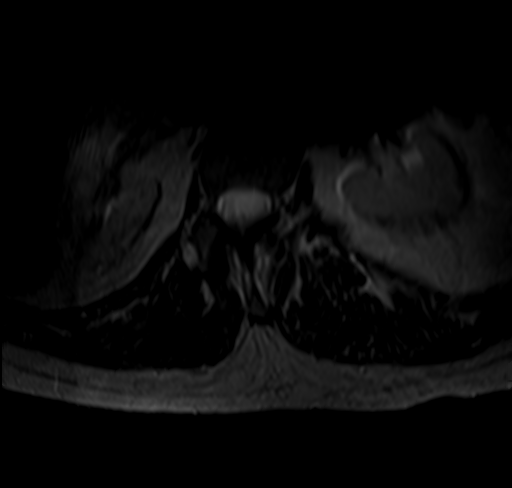
[im 9/51]
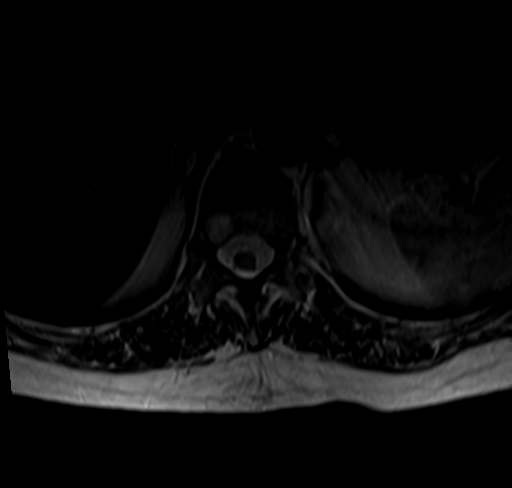
[im 17/51]
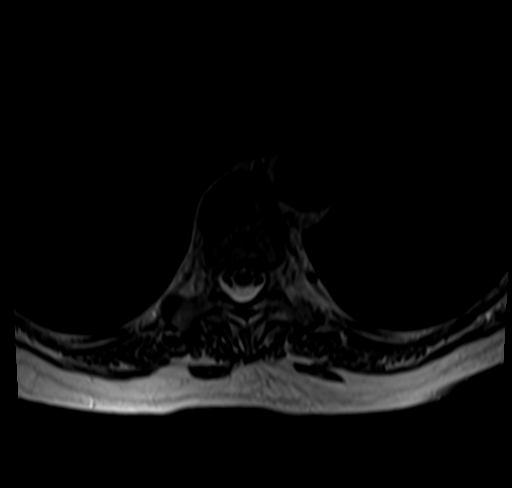
[im 21/51]
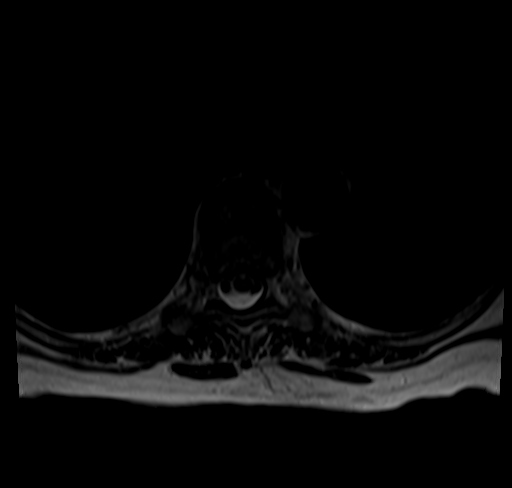
[im 26/51]
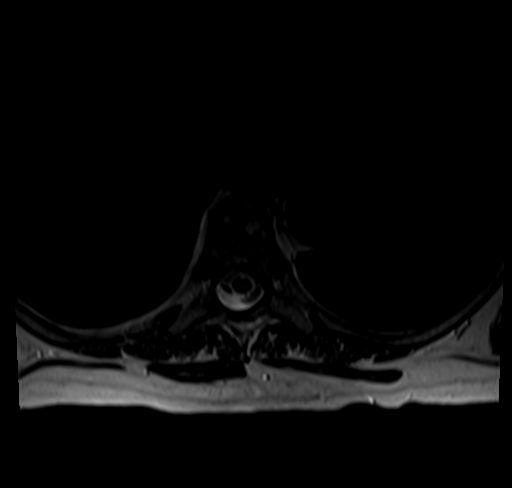
[im 30/51]
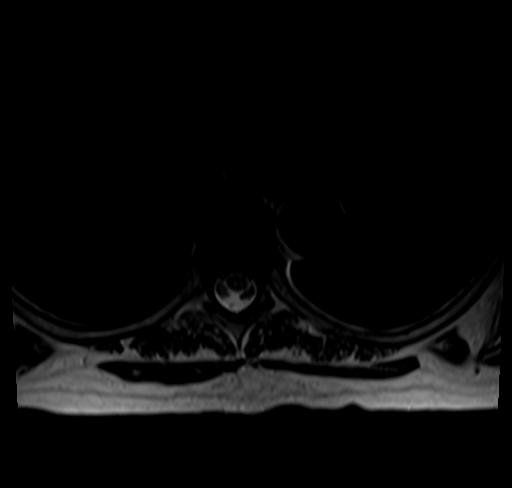
[im 34/51]
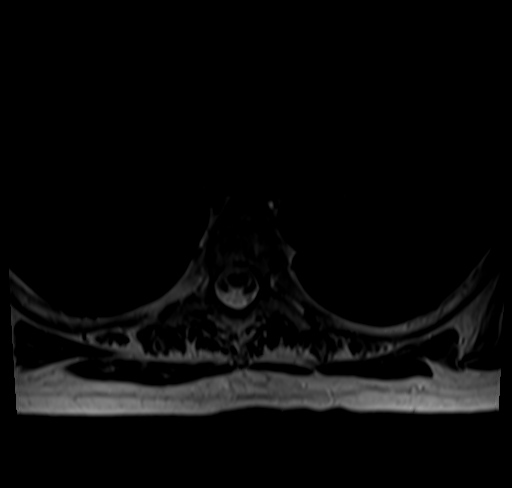
[im 42/51]
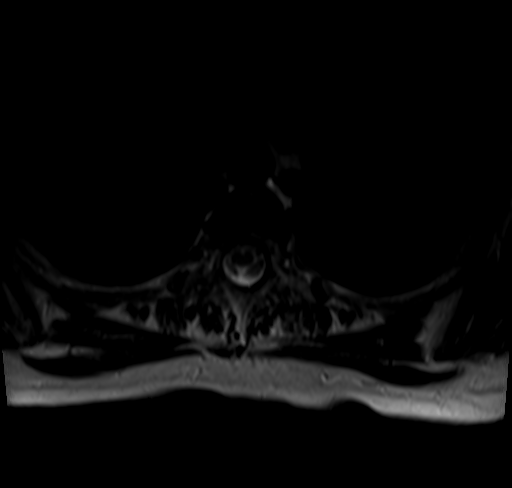
[im 51/51]
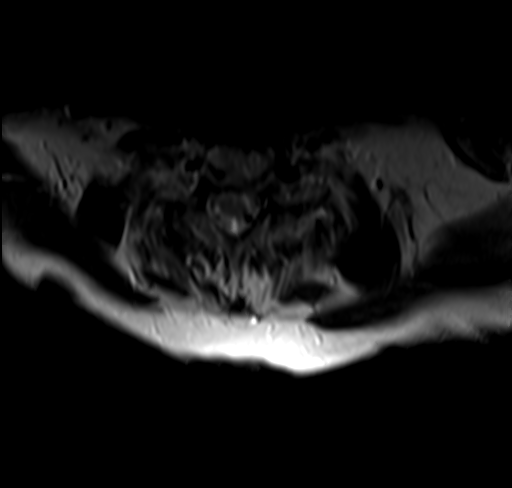

[24 of 48 positions shown; findings below may reference images not displayed]

FINDINGS: Alignment:  Normal

Vertebrae: No fracture or primary bone lesion. Benign appearing
hemangioma within the right side of T11 incidentally noted.

Cord:  No cord compression or primary cord lesion.

Paraspinal and other soft tissues: Negative

Disc levels:

No significant thoracic disc finding at T6-7 or above.

T7-8: Shallow right posterolateral disc herniation indents the
thecal sac. This does not have any affect upon the cord. Some
potential this could cause right T7-8 irritation.

T8-9 and T9-10: Minimal noncompressive disc bulges.

T10-11: Small central disc protrusion indents the thecal sac but
does not affect the cord or show foraminal extension.

T11-12 and T12-L1: Negative.
IMPRESSION: 1. T7-8: Shallow right posterolateral disc herniation indents the
thecal sac. This does not have any affect upon the cord. Some
potential this could cause right T7-8 irritation.
2. T8-9 and T9-10: Minimal disc bulges.
3. T10-11: Small central disc protrusion indents the thecal sac but
does not affect the cord or show foraminal extension.

## 2020-10-24 IMAGING — MR MR CERVICAL SPINE W/O CM
5 series · 29 of 48 positions shown · non-contrast
Comparison: Radiography [DATE]

CLINICAL DATA: Progressive leg weakness. Question myelopathy. Pain
of the left leg. Three months duration.

EXAM:
MRI CERVICAL SPINE WITHOUT CONTRAST
TECHNIQUE: Multiplanar, multisequence MR imaging of the cervical spine was
performed. No intravenous contrast was administered.

[Series 3: T1 · sagittal · 3.0mm · 0.41mm/px · 6 of 16 slices shown]
[im 1/16]
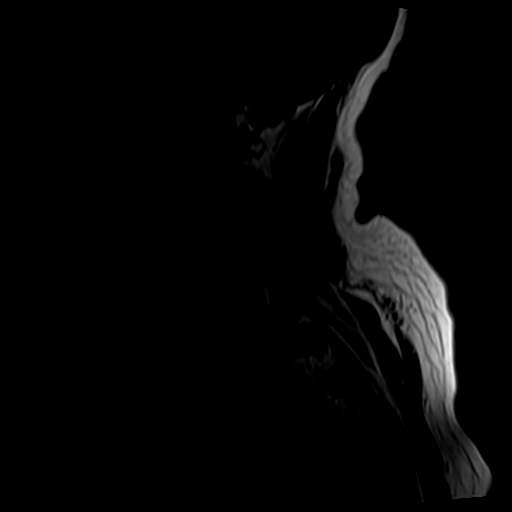
[im 4/16]
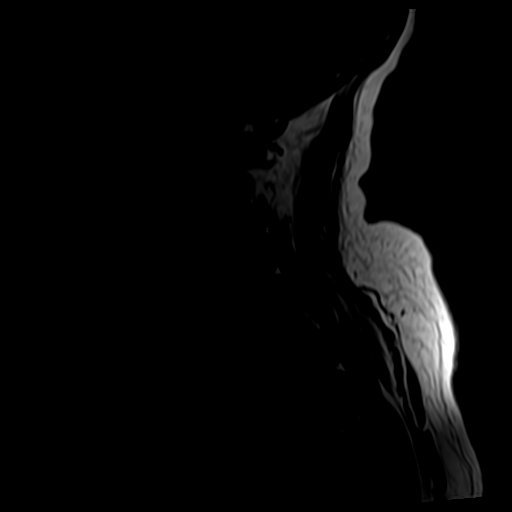
[im 7/16]
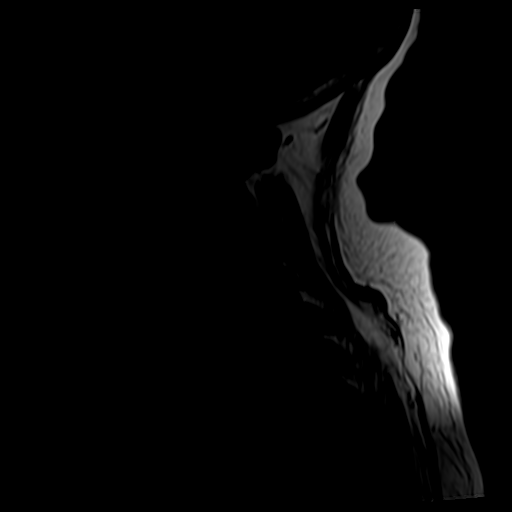
[im 10/16]
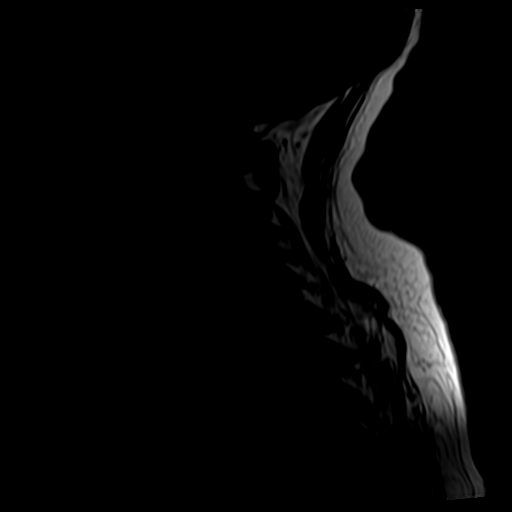
[im 13/16]
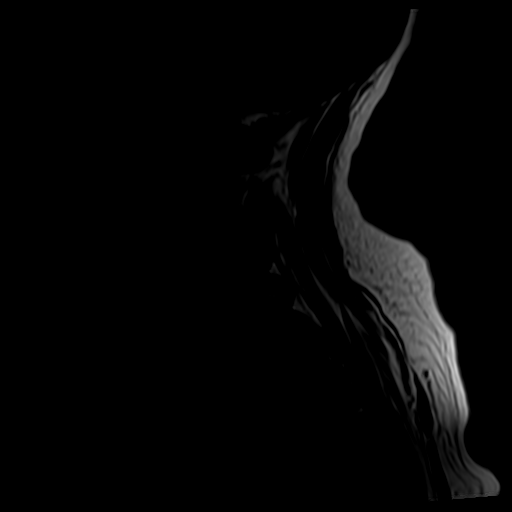
[im 16/16]
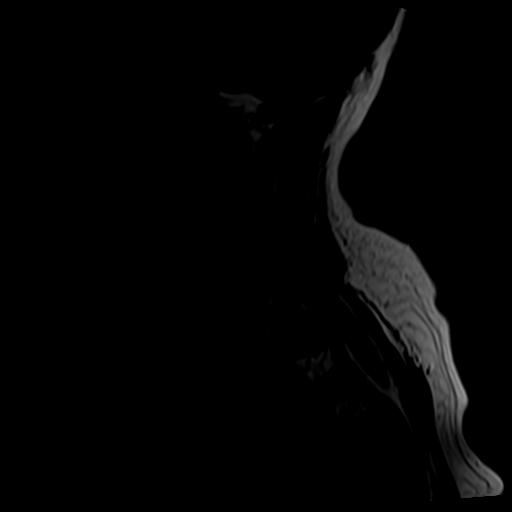

[Series 4: T2 · sagittal · 3.0mm · 0.82mm/px · 7 of 16 slices shown (1 of 2)]
[im 1/16]
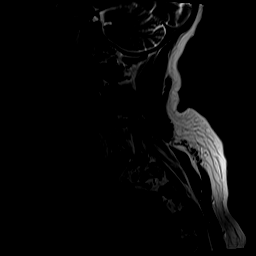
[im 3/16]
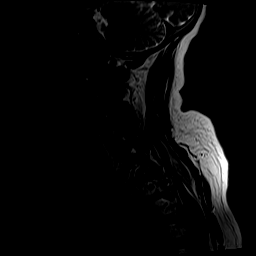
[im 6/16]
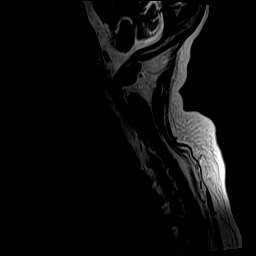
[im 8/16]
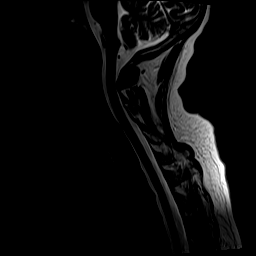
[im 11/16]
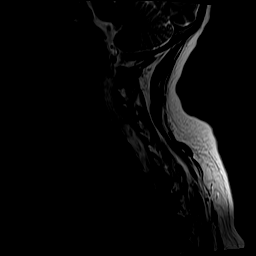
[im 13/16]
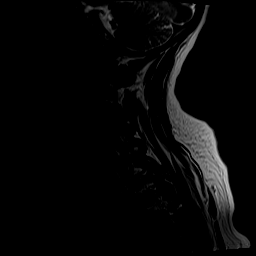
[im 16/16]
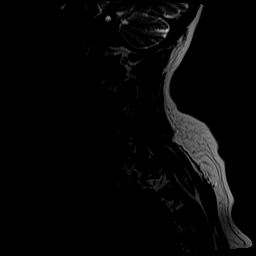

[Series 5: tir sag · sagittal · 3.0mm · 0.41mm/px · 7 of 16 slices shown]
[im 1/16]
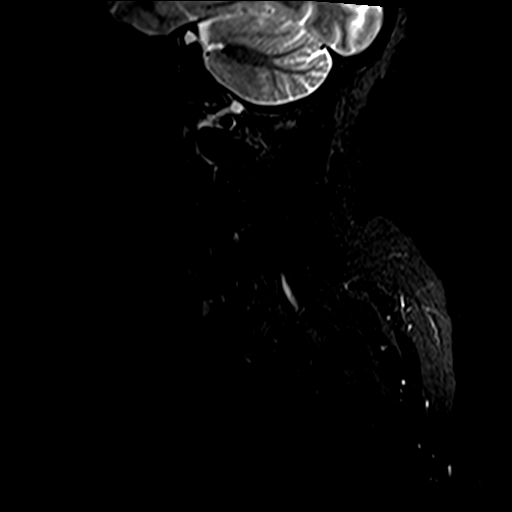
[im 3/16]
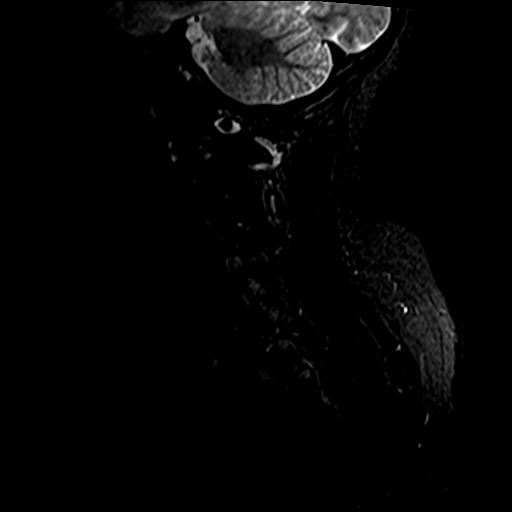
[im 6/16]
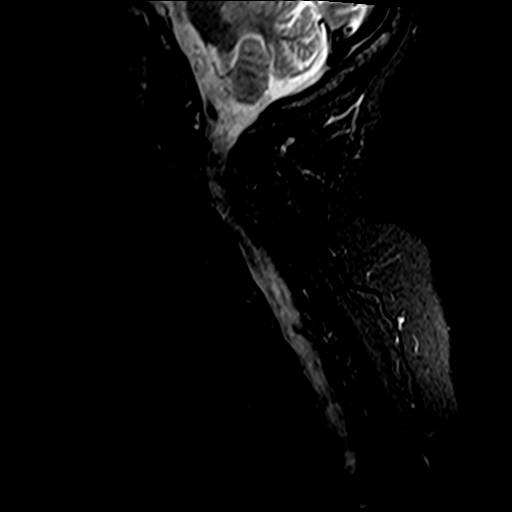
[im 8/16]
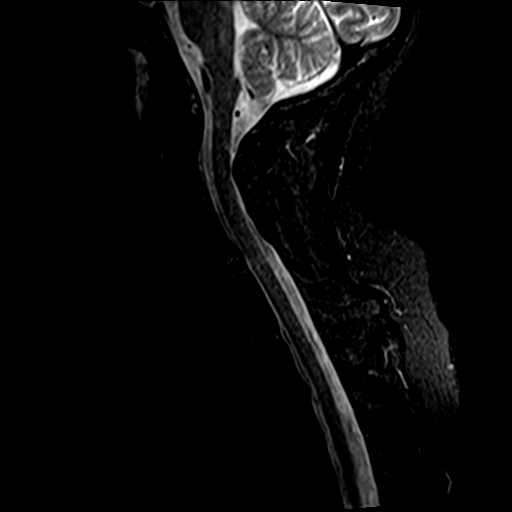
[im 11/16]
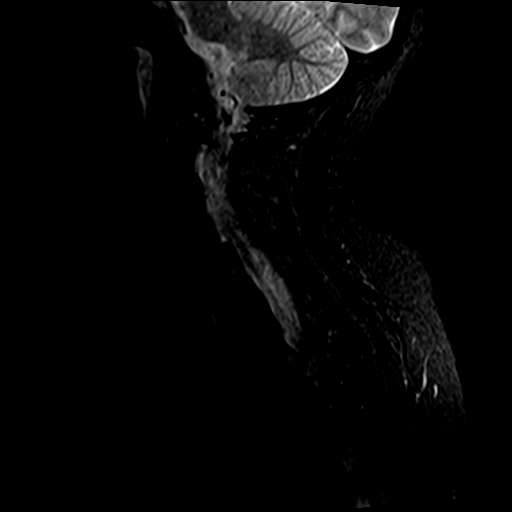
[im 13/16]
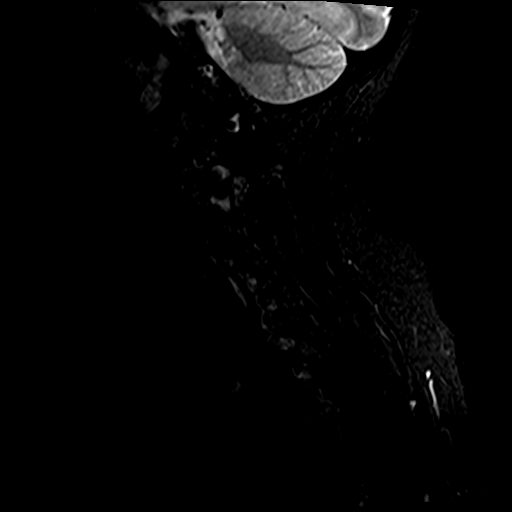
[im 16/16]
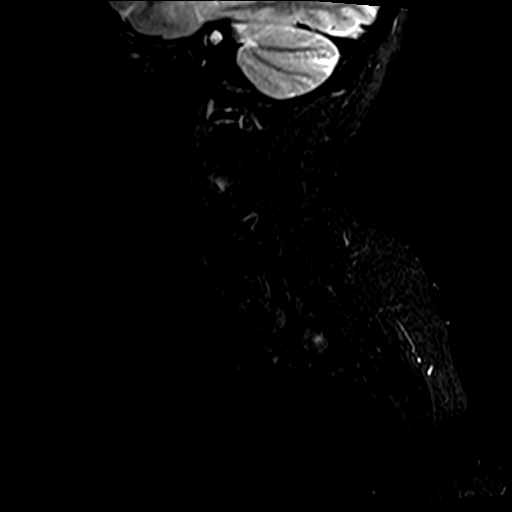

[Series 6: GRE · axial · 3.0mm · 0.35mm/px · 1 of 34 slices shown]
[im 1/34]
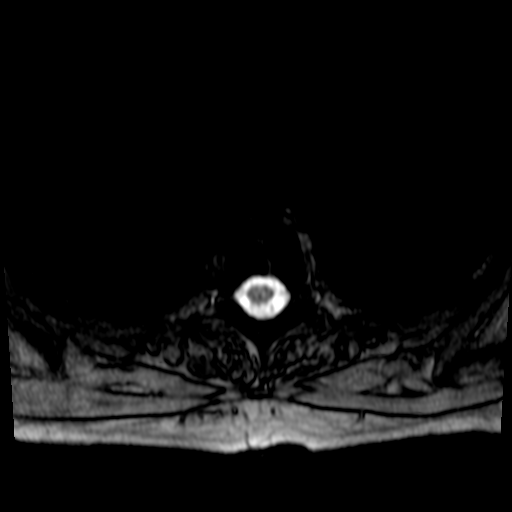

[Series 7: T2 · axial · 3.0mm · 0.70mm/px · z∈[-113,+7]mm · 8 of 34 slices shown (2 of 2)]
[im 1/34]
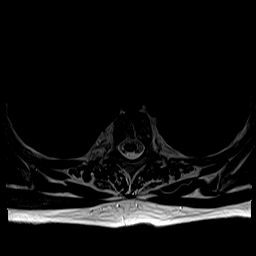
[im 6/34]
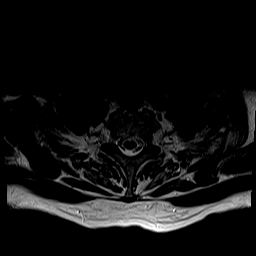
[im 11/34]
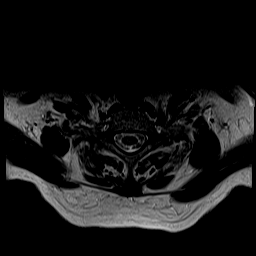
[im 16/34]
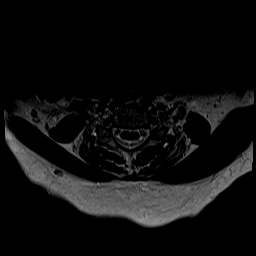
[im 18/34]
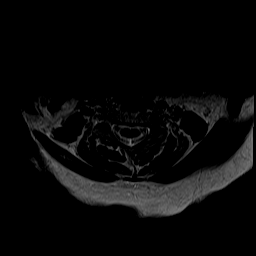
[im 23/34]
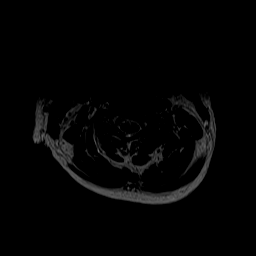
[im 28/34]
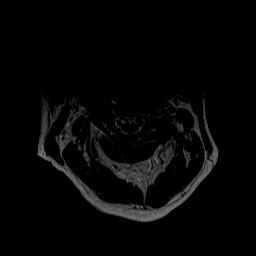
[im 34/34]
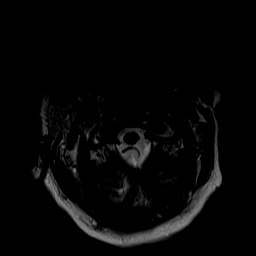

[29 of 48 positions shown; findings below may reference images not displayed]

FINDINGS: Alignment: 1 mm degenerative anterolisthesis C3-4 and C4-5.

Vertebrae: Solid union from C5 through C7.

Cord: No cord compression or primary cord lesion.

Posterior Fossa, vertebral arteries, paraspinal tissues: Negative

Disc levels:

Foramen magnum is widely patent. Ordinary osteoarthritis at the C1-2
articulation but without encroachment upon the neural spaces.

C2-3: No disc abnormality. No stenosis. Edematous facet arthropathy
on right which could be a cause of right-sided neck pain.

C3-4: Facet osteoarthritis but without edema. 1 mm anterolisthesis.
No stenosis of the canal or foramina.

C4-5: Facet osteoarthritis but without edema. 1 mm degenerative
anterolisthesis. Small endplate osteophytes. No compressive canal or
foraminal narrowing.

C5 through C7: Previous ACDF with solid union and wide patency of
the canal foramina.

C7-T1: Mild bulging of the disc. Mild facet osteoarthritis. Mild
edema. No canal or foraminal stenosis.
IMPRESSION: 1. Previous ACDF C5 through C7 with solid union and wide patency of
the canal and foramina.
2. Facet osteoarthritis at C3-4 and C4-5 but without edema or
encroachment upon the neural spaces. 1 mm of anterolisthesis at
those levels. No compressive stenosis.
3. C2-3: Edematous facet arthropathy on the right which could be a
cause of right-sided neck pain.
4. C7-T1: Mild disc bulge and facet osteoarthritis. No compressive
stenosis. Mild facet edema could be painful.

## 2020-10-28 ENCOUNTER — Other Ambulatory Visit: Payer: Self-pay | Admitting: Physician Assistant

## 2020-10-28 MED ORDER — VARENICLINE TARTRATE 0.5 MG PO TABS: ORAL_TABLET | ORAL | 0 refills | Status: DC

## 2020-10-28 NOTE — Telephone Encounter (Signed)
Please review. She is also requesting GENERIC Chantix? Not familiar with that. She is requesting the 1/2 dose daily for 2 weeks she has the 1/day.    [facility] on [address]

## 2020-10-28 NOTE — Telephone Encounter (Signed)
Pt called requesting Rx for generic Varenicline 1/2 dose 1/d for 2 weeks. [facility] [employer]

## 2020-11-02 ENCOUNTER — Telehealth: Payer: Self-pay | Admitting: Neurology

## 2020-11-02 ENCOUNTER — Ambulatory Visit: Payer: Self-pay | Admitting: Student

## 2020-11-02 NOTE — H&P (Signed)
TOTAL HIP ADMISSION H&P  Patient is admitted for right total hip arthroplasty.  Subjective:  Chief Complaint: right hip pain  HPI: Diana Alvarado, 65 y.o. female, has a history of pain and functional disability in the right hip(s) due to arthritis and patient has failed non-surgical conservative treatments for greater than 12 weeks to include NSAID's and/or analgesics and corticosteriod injections.  Onset of symptoms was gradual starting 3 years ago with gradually worsening course since that time.The patient noted no past surgery on the right hip(s).  Patient currently rates pain in the right hip at 8 out of 10 with activity. Patient has worsening of pain with activity and weight bearing, pain that interfers with activities of daily living and pain with passive range of motion. Patient has evidence of subchondral cysts, subchondral sclerosis and joint space narrowing by imaging studies. This condition presents safety issues increasing the risk of falls.There is no current active infection.  Patient Active Problem List   Diagnosis Date Noted  . Chronic lumbar radicular pain (Left) 04/29/2018  . Cervicalgia (Bilateral) 04/29/2018  . Osteoarthritis of hands (Bilateral) 04/29/2018  . Cervical fusion syndrome 04/29/2018  . History of fusion of cervical spine 04/29/2018  . 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 History of substance use disorder 04/29/2018  . Cervical facet arthropathy (Bilateral) 04/29/2018  . Cervical facet syndrome (Bilateral) 04/29/2018  . Sacroiliac joint dysfunction (Left) 04/29/2018  . Chronic sacroiliac joint pain (Left) 04/29/2018  . Lumbar facet syndrome (Bilateral) (L>R) 04/29/2018  . Lumbar facet arthropathy (Bilateral) 04/29/2018  . Spondylosis without myelopathy or radiculopathy, lumbosacral region 04/29/2018  . Other specified dorsopathies, sacral and sacrococcygeal region 04/29/2018  . DDD (degenerative disc disease), lumbar 04/29/2018  .  Osteoarthritis of hips (Bilateral) 04/29/2018  . Chronic myofascial pain 04/29/2018  . Neurogenic pain 04/29/2018  . Chronic low back pain (Primary Area of Pain) (Bilateral) (L>R) w/ sciatica (Left) 03/26/2018  . Chronic lower extremity pain (Secondary Area of Pain) (Bilateral) (L>R) 03/26/2018  . Chronic neck pain [facility] Area of Pain) (Bilateral) 03/26/2018  . Chronic hand pain (Fourth Area of Pain) (Bilateral) 03/26/2018  . Chronic hip pain (Left) 03/26/2018  . Chronic pain syndrome 03/26/2018  . Long term current use of opiate analgesic 03/26/2018  . Pharmacologic therapy 03/26/2018  . Disorder of skeletal system 03/26/2018  . Problems influencing health status 03/26/2018  . Impingement syndrome of shoulder region (Right) 02/04/2018  . Splenic artery aneurysm ([place]) 09/12/2016  . Insomnia 12/08/2013  . Ganglion cyst 12/24/2012  . Protrusion of cervical intervertebral disc 12/24/2012  . Cigarette nicotine dependence without complication 35/57/3220  . Personal history of drug dependence ([place]) 06/05/2012  . Cervical post-laminectomy syndrome 02/06/2012  . Anxiety state 11/17/2011  . Asthma 04/19/2011  . Preventative health care 04/19/2011  . DDD (degenerative disc disease), cervical 04/19/2011  . Major depression 03/04/2009  . Attention deficit disorder 03/04/2009  . Generalized anxiety disorder 03/04/2009   Past Medical History:  Diagnosis Date  . ADHD   . Anxiety 11/17/2011  . Asthma 04/19/2011  . Chronic LBP 04/19/2011  . Depression 04/19/2011  . DJD (degenerative joint disease), cervical 04/19/2011  . Lumbar disc disease 04/19/2011  . UTI (lower urinary tract infection)     Past Surgical History:  Procedure Laterality Date  . ABDOMINAL HYSTERECTOMY  1997  . gall bladder  2005  . NECK SURGERY  1995  . ruptured disc  1992   repaired ruptured disc, neck    Current Outpatient  Medications  Medication Sig Dispense Refill Last Dose  . acetaminophen (TYLENOL) 500 MG tablet  Take 500 mg by mouth every 6 (six) hours as needed.     Marland Kitchen albuterol (PROVENTIL HFA;VENTOLIN HFA) 108 (90 BASE) MCG/ACT inhaler Inhale 2 puffs into the lungs every 6 (six) hours as needed for wheezing or shortness of breath.     . ALPRAZolam (XANAX) 0.5 MG tablet TAKE 1/2 TO 1 TABLET BY MOUTH TWICE DAILY AS NEEDED FOR ANXIETY. MUST LAST 30 DAYS. 20 tablet 0   . amphetamine-dextroamphetamine (ADDERALL) 30 MG tablet Take 1 tablet by mouth 2 (two) times daily. 60 tablet 0   . amphetamine-dextroamphetamine (ADDERALL) 30 MG tablet Take 1 tablet by mouth 2 (two) times daily. 60 tablet 0   . amphetamine-dextroamphetamine (ADDERALL) 30 MG tablet Take 1 tablet by mouth 2 (two) times daily. 60 tablet 0   . ARIPiprazole (ABILIFY) 10 MG tablet TAKE 1 TABLET(10 MG) BY MOUTH DAILY 90 tablet 1   . Buprenorphine 15 MCG/HR PTWK buprenorphine 15 mcg/hour weekly transdermal patch (Patient not taking: Reported on 08/16/2020)     . cyclobenzaprine (FLEXERIL) 10 MG tablet TAKE 1 TABLET BY MOUTH EVERY 8 HOURS AS NEEDED FOR PAIN/SPASM (Patient not taking: No sig reported)  0   . DULoxetine (CYMBALTA) 60 MG capsule TAKE 1 CAPSULE BY MOUTH EVERY DAY 90 capsule 1   . fentaNYL (DURAGESIC) 50 MCG/HR 1 patch every other day. (Patient not taking: Reported on 08/16/2020)     . hydrOXYzine (ATARAX/VISTARIL) 10 MG tablet TAKE 1 TO 3 TABLETS(10 TO 30 MG) BY MOUTH THREE TIMES DAILY AS NEEDED FOR ANXIETY 60 tablet 1   . oxyCODONE-acetaminophen (PERCOCET) 10-325 MG tablet TK 1 T PO Q 6 H PRN P     . varenicline (CHANTIX) 0.5 MG tablet 1 p.o. daily for 2 days, then 1 p.o. twice daily until gone.  Then she will take the 1 mg prescription 30 tablet 0   . varenicline (CHANTIX) 1 MG tablet Take 1 tablet (1 mg total) by mouth 2 (two) times daily. Start after completing the 2 weeks of 0.5 mg. (Patient not taking: Reported on 08/16/2020) 60 tablet 1    No current facility-administered medications for this visit.   Allergies  Allergen Reactions   . Aspirin     REACTION: hives  . Codeine   . Sulfonamide Derivatives     REACTION: hives    Social History   Tobacco Use  . Smoking status: Current Every Day Smoker    Packs/day: 0.10    Years: 10.00    Pack years: 1.00    Types: Cigarettes  . Smokeless tobacco: Never Used  Substance Use Topics  . Alcohol use: No    Family History  Problem Relation Age of Onset  . Arthritis Other   . Cancer Other        cancer  . Diabetes Other   . Colon cancer Maternal Aunt   . Esophageal cancer Neg Hx   . Rectal cancer Neg Hx   . Stomach cancer Neg Hx      Review of Systems  Musculoskeletal: Positive for arthralgias and back pain.  All other systems reviewed and are negative.   Objective:  Physical Exam HENT:     Head: Normocephalic.  Eyes:     Pupils: Pupils are equal, round, and reactive to light.  Cardiovascular:     Rate and Rhythm: Normal rate and regular rhythm.     Pulses: Normal pulses.  Pulmonary:  Breath sounds: Normal breath sounds.  Abdominal:     Palpations: Abdomen is soft.     Tenderness: There is no abdominal tenderness.  Genitourinary:    Comments: Deferred Musculoskeletal:     Cervical back: Normal range of motion.     Comments: Examination of the right hip reveals no skin wounds or lesions. Mild trochanteric tenderness to palpation. Her right hip is stiff. I can flex her up to 95, 5 internal rotation, 30 external rotation. She has pain with flexion and rotation. Pain in the position of impingement. Positive Stinchfield  Skin:    General: Skin is warm and dry.  Neurological:     Mental Status: She is alert and oriented to person, place, and time.  Psychiatric:        Mood and Affect: Mood normal.     Vital signs in last 24 hours: @VSRANGES @  Labs:   Estimated body mass index is 30.99 kg/m as calculated from the following:   Height as of 08/16/20: 5\' 1"  (1.549 m).   Weight as of 08/16/20: 74.4 kg.   Imaging Review Plain  radiographs demonstrate severe degenerative joint disease of the right hip(s). The bone quality appears to be adequate for age and reported activity level.      Assessment/Plan:  End stage arthritis, right hip(s)  The patient history, physical examination, clinical judgement of the provider and imaging studies are consistent with end stage degenerative joint disease of the right hip(s) and total hip arthroplasty is deemed medically necessary. The treatment options including medical management, injection therapy, arthroscopy and arthroplasty were discussed at length. The risks and benefits of total hip arthroplasty were presented and reviewed. The risks due to aseptic loosening, infection, stiffness, dislocation/subluxation,  thromboembolic complications and other imponderables were discussed.  The patient acknowledged the explanation, agreed to proceed with the plan and consent was signed. Patient is being admitted for inpatient treatment for surgery, pain control, PT, OT, prophylactic antibiotics, VTE prophylaxis, progressive ambulation and ADL's and discharge planning.The patient is planning to be discharged home after overnight observation

## 2020-11-02 NOTE — Telephone Encounter (Signed)
Results of MRI cervical and thoracic spine discussed, which shows multilevel degenerative changes, without compressive pathology which would explain her symptoms.  She reports having some improvement in leg symptoms and has not identified anything specific which has helped.  Patient will continue to monitor symptoms and contact the office, if symptoms progress.

## 2020-11-02 NOTE — H&P (View-Only) (Signed)
TOTAL HIP ADMISSION H&P  Patient is admitted for right total hip arthroplasty.  Subjective:  Chief Complaint: right hip pain  HPI: Diana Alvarado, 65 y.o. female, has a history of pain and functional disability in the right hip(s) due to arthritis and patient has failed non-surgical conservative treatments for greater than 12 weeks to include NSAID's and/or analgesics and corticosteriod injections.  Onset of symptoms was gradual starting 3 years ago with gradually worsening course since that time.The patient noted no past surgery on the right hip(s).  Patient currently rates pain in the right hip at 8 out of 10 with activity. Patient has worsening of pain with activity and weight bearing, pain that interfers with activities of daily living and pain with passive range of motion. Patient has evidence of subchondral cysts, subchondral sclerosis and joint space narrowing by imaging studies. This condition presents safety issues increasing the risk of falls.There is no current active infection.  Patient Active Problem List   Diagnosis Date Noted  . Chronic lumbar radicular pain (Left) 04/29/2018  . Cervicalgia (Bilateral) 04/29/2018  . Osteoarthritis of hands (Bilateral) 04/29/2018  . Cervical fusion syndrome 04/29/2018  . History of fusion of cervical spine 04/29/2018  . 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 History of substance use disorder 04/29/2018  . Cervical facet arthropathy (Bilateral) 04/29/2018  . Cervical facet syndrome (Bilateral) 04/29/2018  . Sacroiliac joint dysfunction (Left) 04/29/2018  . Chronic sacroiliac joint pain (Left) 04/29/2018  . Lumbar facet syndrome (Bilateral) (L>R) 04/29/2018  . Lumbar facet arthropathy (Bilateral) 04/29/2018  . Spondylosis without myelopathy or radiculopathy, lumbosacral region 04/29/2018  . Other specified dorsopathies, sacral and sacrococcygeal region 04/29/2018  . DDD (degenerative disc disease), lumbar 04/29/2018  .  Osteoarthritis of hips (Bilateral) 04/29/2018  . Chronic myofascial pain 04/29/2018  . Neurogenic pain 04/29/2018  . Chronic low back pain (Primary Area of Pain) (Bilateral) (L>R) w/ sciatica (Left) 03/26/2018  . Chronic lower extremity pain (Secondary Area of Pain) (Bilateral) (L>R) 03/26/2018  . Chronic neck pain [facility] Area of Pain) (Bilateral) 03/26/2018  . Chronic hand pain (Fourth Area of Pain) (Bilateral) 03/26/2018  . Chronic hip pain (Left) 03/26/2018  . Chronic pain syndrome 03/26/2018  . Long term current use of opiate analgesic 03/26/2018  . Pharmacologic therapy 03/26/2018  . Disorder of skeletal system 03/26/2018  . Problems influencing health status 03/26/2018  . Impingement syndrome of shoulder region (Right) 02/04/2018  . Splenic artery aneurysm ([place]) 09/12/2016  . Insomnia 12/08/2013  . Ganglion cyst 12/24/2012  . Protrusion of cervical intervertebral disc 12/24/2012  . Cigarette nicotine dependence without complication 01/60/1093  . Personal history of drug dependence ([place]) 06/05/2012  . Cervical post-laminectomy syndrome 02/06/2012  . Anxiety state 11/17/2011  . Asthma 04/19/2011  . Preventative health care 04/19/2011  . DDD (degenerative disc disease), cervical 04/19/2011  . Major depression 03/04/2009  . Attention deficit disorder 03/04/2009  . Generalized anxiety disorder 03/04/2009   Past Medical History:  Diagnosis Date  . ADHD   . Anxiety 11/17/2011  . Asthma 04/19/2011  . Chronic LBP 04/19/2011  . Depression 04/19/2011  . DJD (degenerative joint disease), cervical 04/19/2011  . Lumbar disc disease 04/19/2011  . UTI (lower urinary tract infection)     Past Surgical History:  Procedure Laterality Date  . ABDOMINAL HYSTERECTOMY  1997  . gall bladder  2005  . NECK SURGERY  1995  . ruptured disc  1992   repaired ruptured disc, neck    Current Outpatient  Medications  Medication Sig Dispense Refill Last Dose  . acetaminophen (TYLENOL) 500 MG tablet  Take 500 mg by mouth every 6 (six) hours as needed.     Marland Kitchen albuterol (PROVENTIL HFA;VENTOLIN HFA) 108 (90 BASE) MCG/ACT inhaler Inhale 2 puffs into the lungs every 6 (six) hours as needed for wheezing or shortness of breath.     . ALPRAZolam (XANAX) 0.5 MG tablet TAKE 1/2 TO 1 TABLET BY MOUTH TWICE DAILY AS NEEDED FOR ANXIETY. MUST LAST 30 DAYS. 20 tablet 0   . amphetamine-dextroamphetamine (ADDERALL) 30 MG tablet Take 1 tablet by mouth 2 (two) times daily. 60 tablet 0   . amphetamine-dextroamphetamine (ADDERALL) 30 MG tablet Take 1 tablet by mouth 2 (two) times daily. 60 tablet 0   . amphetamine-dextroamphetamine (ADDERALL) 30 MG tablet Take 1 tablet by mouth 2 (two) times daily. 60 tablet 0   . ARIPiprazole (ABILIFY) 10 MG tablet TAKE 1 TABLET(10 MG) BY MOUTH DAILY 90 tablet 1   . Buprenorphine 15 MCG/HR PTWK buprenorphine 15 mcg/hour weekly transdermal patch (Patient not taking: Reported on 08/16/2020)     . cyclobenzaprine (FLEXERIL) 10 MG tablet TAKE 1 TABLET BY MOUTH EVERY 8 HOURS AS NEEDED FOR PAIN/SPASM (Patient not taking: No sig reported)  0   . DULoxetine (CYMBALTA) 60 MG capsule TAKE 1 CAPSULE BY MOUTH EVERY DAY 90 capsule 1   . fentaNYL (DURAGESIC) 50 MCG/HR 1 patch every other day. (Patient not taking: Reported on 08/16/2020)     . hydrOXYzine (ATARAX/VISTARIL) 10 MG tablet TAKE 1 TO 3 TABLETS(10 TO 30 MG) BY MOUTH THREE TIMES DAILY AS NEEDED FOR ANXIETY 60 tablet 1   . oxyCODONE-acetaminophen (PERCOCET) 10-325 MG tablet TK 1 T PO Q 6 H PRN P     . varenicline (CHANTIX) 0.5 MG tablet 1 p.o. daily for 2 days, then 1 p.o. twice daily until gone.  Then she will take the 1 mg prescription 30 tablet 0   . varenicline (CHANTIX) 1 MG tablet Take 1 tablet (1 mg total) by mouth 2 (two) times daily. Start after completing the 2 weeks of 0.5 mg. (Patient not taking: Reported on 08/16/2020) 60 tablet 1    No current facility-administered medications for this visit.   Allergies  Allergen Reactions   . Aspirin     REACTION: hives  . Codeine   . Sulfonamide Derivatives     REACTION: hives    Social History   Tobacco Use  . Smoking status: Current Every Day Smoker    Packs/day: 0.10    Years: 10.00    Pack years: 1.00    Types: Cigarettes  . Smokeless tobacco: Never Used  Substance Use Topics  . Alcohol use: No    Family History  Problem Relation Age of Onset  . Arthritis Other   . Cancer Other        cancer  . Diabetes Other   . Colon cancer Maternal Aunt   . Esophageal cancer Neg Hx   . Rectal cancer Neg Hx   . Stomach cancer Neg Hx      Review of Systems  Musculoskeletal: Positive for arthralgias and back pain.  All other systems reviewed and are negative.   Objective:  Physical Exam HENT:     Head: Normocephalic.  Eyes:     Pupils: Pupils are equal, round, and reactive to light.  Cardiovascular:     Rate and Rhythm: Normal rate and regular rhythm.     Pulses: Normal pulses.  Pulmonary:  Breath sounds: Normal breath sounds.  Abdominal:     Palpations: Abdomen is soft.     Tenderness: There is no abdominal tenderness.  Genitourinary:    Comments: Deferred Musculoskeletal:     Cervical back: Normal range of motion.     Comments: Examination of the right hip reveals no skin wounds or lesions. Mild trochanteric tenderness to palpation. Her right hip is stiff. I can flex her up to 95, 5 internal rotation, 30 external rotation. She has pain with flexion and rotation. Pain in the position of impingement. Positive Stinchfield  Skin:    General: Skin is warm and dry.  Neurological:     Mental Status: She is alert and oriented to person, place, and time.  Psychiatric:        Mood and Affect: Mood normal.     Vital signs in last 24 hours: @VSRANGES @  Labs:   Estimated body mass index is 30.99 kg/m as calculated from the following:   Height as of 08/16/20: 5\' 1"  (1.549 m).   Weight as of 08/16/20: 74.4 kg.   Imaging Review Plain  radiographs demonstrate severe degenerative joint disease of the right hip(s). The bone quality appears to be adequate for age and reported activity level.      Assessment/Plan:  End stage arthritis, right hip(s)  The patient history, physical examination, clinical judgement of the provider and imaging studies are consistent with end stage degenerative joint disease of the right hip(s) and total hip arthroplasty is deemed medically necessary. The treatment options including medical management, injection therapy, arthroscopy and arthroplasty were discussed at length. The risks and benefits of total hip arthroplasty were presented and reviewed. The risks due to aseptic loosening, infection, stiffness, dislocation/subluxation,  thromboembolic complications and other imponderables were discussed.  The patient acknowledged the explanation, agreed to proceed with the plan and consent was signed. Patient is being admitted for inpatient treatment for surgery, pain control, PT, OT, prophylactic antibiotics, VTE prophylaxis, progressive ambulation and ADL's and discharge planning.The patient is planning to be discharged home after overnight observation

## 2020-11-04 ENCOUNTER — Other Ambulatory Visit: Payer: Self-pay | Admitting: Physician Assistant

## 2020-11-10 ENCOUNTER — Other Ambulatory Visit: Payer: Self-pay | Admitting: Physician Assistant

## 2020-11-11 NOTE — Progress Notes (Addendum)
COVID Vaccine Completed:  x3 Date COVID Vaccine completed:  09-24-19 10-15-19 Has received booster:  06-01-20 COVID vaccine manufacturer: Pfizer     Date of COVID positive in last 90 days:  N/A  PCP - Vicenta Aly, NP Cardiologist - N/A  Medical clearance on chart from Vicenta Aly, NP  Chest x-ray - N/A EKG - 10-21-20 on chart Stress Test -  ECHO -  Cardiac Cath -  Pacemaker/ICD device last checked: Spinal Cord Stimulator:  Sleep Study - N/A CPAP -   Fasting Blood Sugar - N/A Checks Blood Sugar _____ times a day  Blood Thinner Instructions:  N/A Aspirin Instructions: Last Dose:  Activity level:  Unable to go up a flight of stairs without symptoms of hip pain and knee pain.   No chest pain or shortness of breath   Anesthesia review: splenic artery aneurysm, COPD, asthma  Patient denies shortness of breath, fever, cough and chest pain at PAT appointment   Patient verbalized understanding of instructions that were given to them at the PAT appointment. Patient was also instructed that they will need to review over the PAT instructions again at home before surgery.

## 2020-11-11 NOTE — Patient Instructions (Addendum)
DUE TO COVID-19 ONLY ONE VISITOR IS ALLOWED TO COME WITH YOU AND STAY IN THE WAITING ROOM ONLY DURING PRE OP AND PROCEDURE.   IF YOU WILL BE ADMITTED INTO THE HOSPITAL YOU ARE ALLOWED ONLY TWO SUPPORT PEOPLE DURING VISITATION HOURS ONLY (10AM -8PM)   . The support person(s) may change daily. . The support person(s) must pass our screening, gel in and out, and wear a mask at all times, including in the patient's room. . Patients must also wear a mask when staff or their support person are in the room.  No visitors under the age of 21. Any visitor under the age of 74 must be accompanied by an adult.    COVID SWAB TESTING MUST BE COMPLETED ON:  Monday, 11-22-20 @ 11:15 AM   [address] [place], [place] [address]  (Must self quarantine after testing. Follow instructions on handout.)        Your procedure is scheduled on:  Thursday, 11-25-20   Report to [facility] Main  Entrance   Report to Short Stay at 5:30 AM   [facility])    Call this number if you have problems the morning of surgery [phone]   Do not eat food :After Midnight.   May have liquids until 4:30 AM day of surgery  CLEAR LIQUID DIET  Foods Allowed                                                                     Foods Excluded  Water, Black Coffee and tea, regular and decaf              liquids that you cannot  Plain Jell-O in any flavor  (No red)                                     see through such as: Fruit ices (not with fruit pulp)                                      milk, soups, orange juice              Iced Popsicles (No red)                                      All solid food                                   Apple juices Sports drinks like Gatorade (No red) Lightly seasoned clear broth or consume(fat free) Sugar, honey syrup    Complete one Ensure drink the morning of surgery at 4:30 AM  the day of surgery.      1. The day of surgery:  ? Drink ONE (1) Pre-Surgery Clear Ensure or G2  by am the morning of surgery. Drink in one sitting. Do not sip.  ? This drink was given to you during your hospital  pre-op appointment  visit. ? Nothing else to drink after completing the  Pre-Surgery Clear Ensure or G2.          If you have questions, please contact your surgeon's office.     Oral Hygiene is also important to reduce your risk of infection.                                    Remember - BRUSH YOUR TEETH THE MORNING OF SURGERY WITH YOUR REGULAR TOOTHPASTE   Do NOT smoke after Midnight   Take these medicines the morning of surgery with A SIP OF WATER:  Abilify, Duloxetine, Xanax if needed.  Okay to use inhalers and bring  with you day of surgery                                You may not have any metal on your body including hair pins, jewelry, and body piercings             Do not wear make-up, lotions, powders, perfumes/cologne, or deodorant             Do not wear nail polish.  Do not shave  48 hours prior to surgery.    Do not bring valuables to the hospital. [place].   Contacts, dentures or bridgework may not be worn into surgery.   Bring small overnight bag day of surgery.               Please read over the following fact sheets you were given: IF YOU HAVE QUESTIONS ABOUT YOUR PRE OP INSTRUCTIONS PLEASE CALL  [facility] - Preparing for Surgery Before surgery, you can play an important role.  Because skin is not sterile, your skin needs to be as free of germs as possible.  You can reduce the number of germs on your skin by washing with CHG (chlorahexidine gluconate) soap before surgery.  CHG is an antiseptic cleaner which kills germs and bonds with the skin to continue killing germs even after washing. Please DO NOT use if you have an allergy to CHG or antibacterial soaps.  If your skin becomes reddened/irritated stop using the CHG and inform your nurse when you arrive at Short Stay. Do not  shave (including legs and underarms) for at least 48 hours prior to the first CHG shower.  You may shave your face/neck.  Please follow these instructions carefully:  1.  Shower with CHG Soap the night before surgery and the  morning of surgery.  2.  If you choose to wash your hair, wash your hair first as usual with your normal  shampoo.  3.  After you shampoo, rinse your hair and body thoroughly to remove the shampoo.                             4.  Use CHG as you would any other liquid soap.  You can apply chg directly to the skin and wash.  Gently with a scrungie or clean washcloth.  5.  Apply the CHG Soap to your body ONLY FROM THE NECK DOWN.   Do   not use on face/ open  Wound or open sores. Avoid contact with eyes, ears mouth and   genitals (private parts).                       Wash face,  Genitals (private parts) with your normal soap.             6.  Wash thoroughly, paying special attention to the area where your    surgery  will be performed.  7.  Thoroughly rinse your body with warm water from the neck down.  8.  DO NOT shower/wash with your normal soap after using and rinsing off the CHG Soap.                9.  Pat yourself dry with a clean towel.            10.  Wear clean pajamas.            11.  Place clean sheets on your bed the night of your first shower and do not  sleep with pets. Day of Surgery : Do not apply any lotions/deodorants the morning of surgery.  Please wear clean clothes to the hospital/surgery center.  FAILURE TO FOLLOW THESE INSTRUCTIONS MAY RESULT IN THE CANCELLATION OF YOUR SURGERY  PATIENT SIGNATURE_________________________________  NURSE SIGNATURE__________________________________  ________________________________________________________________________   Diana Alvarado  An incentive spirometer is a tool that can help keep your lungs clear and active. This tool measures how well you are filling your lungs with each  breath. Taking long deep breaths may help reverse or decrease the chance of developing breathing (pulmonary) problems (especially infection) following:  A long period of time when you are unable to move or be active. BEFORE THE PROCEDURE   If the spirometer includes an indicator to show your best effort, your nurse or respiratory therapist will set it to a desired goal.  If possible, sit up straight or lean slightly forward. Try not to slouch.  Hold the incentive spirometer in an upright position. INSTRUCTIONS FOR USE  1. Sit on the edge of your bed if possible, or sit up as far as you can in bed or on a chair. 2. Hold the incentive spirometer in an upright position. 3. Breathe out normally. 4. Place the mouthpiece in your mouth and seal your lips tightly around it. 5. Breathe in slowly and as deeply as possible, raising the piston or the ball toward the top of the column. 6. Hold your breath for 3-5 seconds or for as long as possible. Allow the piston or ball to fall to the bottom of the column. 7. Remove the mouthpiece from your mouth and breathe out normally. 8. Rest for a few seconds and repeat Steps 1 through 7 at least 10 times every 1-2 hours when you are awake. Take your time and take a few normal breaths between deep breaths. 9. The spirometer may include an indicator to show your best effort. Use the indicator as a goal to work toward during each repetition. 10. After each set of 10 deep breaths, practice coughing to be sure your lungs are clear. If you have an incision (the cut made at the time of surgery), support your incision when coughing by placing a pillow or rolled up towels firmly against it. Once you are able to get out of bed, walk around indoors and cough well. You may stop using the incentive spirometer when instructed by your caregiver.  RISKS AND COMPLICATIONS  Take your time  so you do not get dizzy or light-headed.  If you are in pain, you may need to take or ask  for pain medication before doing incentive spirometry. It is harder to take a deep breath if you are having pain. AFTER USE  Rest and breathe slowly and easily.  It can be helpful to keep track of a log of your progress. Your caregiver can provide you with a simple table to help with this. If you are using the spirometer at home, follow these instructions: [place] IF:   You are having difficultly using the spirometer.  You have trouble using the spirometer as often as instructed.  Your pain medication is not giving enough relief while using the spirometer.  You develop fever of 100.5 F (38.1 C) or higher. SEEK IMMEDIATE MEDICAL CARE IF:   You cough up bloody sputum that had not been present before.  You develop fever of 102 F (38.9 C) or greater.  You develop worsening pain at or near the incision site. MAKE SURE YOU:   Understand these instructions.  Will watch your condition.  Will get help right away if you are not doing well or get worse. Document Released: 01/01/2007 Document Revised: 11/13/2011 Document Reviewed: 03/04/2007 ExitCare Patient Information 2014 Exit[employer], [place].   ________________________________________________________________________  WHAT IS A BLOOD TRANSFUSION? Blood Transfusion Information  A transfusion is the replacement of blood or some of its parts. Blood is made up of multiple cells which provide different functions.  Red blood cells carry oxygen and are used for blood loss replacement.  White blood cells fight against infection.  Platelets control bleeding.  Plasma helps clot blood.  Other blood products are available for specialized needs, such as hemophilia or other clotting disorders. BEFORE THE TRANSFUSION  Who gives blood for transfusions?   Healthy volunteers who are fully evaluated to make sure their blood is safe. This is blood bank blood. Transfusion therapy is the safest it has ever been in the practice of  medicine. Before blood is taken from a donor, a complete history is taken to make sure that person has no history of diseases nor engages in risky social behavior (examples are intravenous drug use or sexual activity with multiple partners). The donor's travel history is screened to minimize risk of transmitting infections, such as malaria. The donated blood is tested for signs of infectious diseases, such as HIV and hepatitis. The blood is then tested to be sure it is compatible with you in order to minimize the chance of a transfusion reaction. If you or a relative donates blood, this is often done in anticipation of surgery and is not appropriate for emergency situations. It takes many days to process the donated blood. RISKS AND COMPLICATIONS Although transfusion therapy is very safe and saves many lives, the main dangers of transfusion include:   Getting an infectious disease.  Developing a transfusion reaction. This is an allergic reaction to something in the blood you were given. Every precaution is taken to prevent this. The decision to have a blood transfusion has been considered carefully by your caregiver before blood is given. Blood is not given unless the benefits outweigh the risks. AFTER THE TRANSFUSION  Right after receiving a blood transfusion, you will usually feel much better and more energetic. This is especially true if your red blood cells have gotten low (anemic). The transfusion raises the level of the red blood cells which carry oxygen, and this usually causes an energy increase.  The  nurse administering the transfusion will monitor you carefully for complications. HOME CARE INSTRUCTIONS  No special instructions are needed after a transfusion. You may find your energy is better. Speak with your caregiver about any limitations on activity for underlying diseases you may have. SEEK MEDICAL CARE IF:   Your condition is not improving after your transfusion.  You develop  redness or irritation at the intravenous (IV) site. SEEK IMMEDIATE MEDICAL CARE IF:  Any of the following symptoms occur over the next 12 hours:  Shaking chills.  You have a temperature by mouth above 102 F (38.9 C), not controlled by medicine.  Chest, back, or muscle pain.  People around you feel you are not acting correctly or are confused.  Shortness of breath or difficulty breathing.  Dizziness and fainting.  You get a rash or develop hives.  You have a decrease in urine output.  Your urine turns a dark color or changes to pink, red, or brown. Any of the following symptoms occur over the next 10 days:  You have a temperature by mouth above 102 F (38.9 C), not controlled by medicine.  Shortness of breath.  Weakness after normal activity.  The white part of the eye turns yellow (jaundice).  You have a decrease in the amount of urine or are urinating less often.  Your urine turns a dark color or changes to pink, red, or brown. Document Released: 08/18/2000 Document Revised: 11/13/2011 Document Reviewed: 04/06/2008 [facility] Patient Information 2014 [place], [place].  _______________________________________________________________________

## 2020-11-14 ENCOUNTER — Other Ambulatory Visit: Payer: Self-pay | Admitting: Physician Assistant

## 2020-11-15 ENCOUNTER — Ambulatory Visit (INDEPENDENT_AMBULATORY_CARE_PROVIDER_SITE_OTHER): Payer: Medicare Other | Admitting: Physician Assistant

## 2020-11-15 ENCOUNTER — Encounter: Payer: Self-pay | Admitting: Physician Assistant

## 2020-11-15 ENCOUNTER — Other Ambulatory Visit: Payer: Self-pay

## 2020-11-15 DIAGNOSIS — F411 Generalized anxiety disorder: Secondary | ICD-10-CM | POA: Diagnosis not present

## 2020-11-15 DIAGNOSIS — F3170 Bipolar disorder, currently in remission, most recent episode unspecified: Secondary | ICD-10-CM | POA: Diagnosis not present

## 2020-11-15 DIAGNOSIS — F909 Attention-deficit hyperactivity disorder, unspecified type: Secondary | ICD-10-CM | POA: Diagnosis not present

## 2020-11-15 DIAGNOSIS — F17200 Nicotine dependence, unspecified, uncomplicated: Secondary | ICD-10-CM

## 2020-11-15 MED ORDER — AMPHETAMINE-DEXTROAMPHETAMINE 30 MG PO TABS: 30.0000 mg | ORAL_TABLET | Freq: Two times a day (BID) | ORAL | 0 refills | Status: DC

## 2020-11-15 NOTE — Progress Notes (Signed)
[facility] Med Check  Patient ID: Diana Alvarado,  MRN: [id number]  PCP: Default, Provider, MD  Date of Evaluation: 11/15/2020 Time spent:40 minutes  Chief Complaint:  Chief Complaint    Anxiety; Depression; ADD; Follow-up      HISTORY/CURRENT STATUS: HPI For routine med check.  Is a bit more anxious right now because is having hip replacement surgery on 11/25/2020.  She is in a lot of pain though and is looking forward to being out of pain after the surgery.  She pretty much takes the Xanax every evening and it helps her relax to go to sleep.  She has started back on Chantix and is only smoking 1 or 2 cigarettes/day.  She is trying hard to quit.  Patient denies loss of interest in usual activities and is able to enjoy things.  Denies decreased energy or motivation.  Appetite has not changed.  No extreme sadness, tearfulness, or feelings of hopelessness. Denies suicidal or homicidal thoughts.  Patient denies increased energy with decreased need for sleep, no increased talkativeness, no racing thoughts, no impulsivity or risky behaviors, no increased spending, no increased libido, no grandiosity, no increased irritability or anger, no paranoia.  And no hallucinations.  She is able to focus on things and get things done in a timely manner.  The Adderall helps that as well as her mood.  "I cannot go off that.  It helps me so much."  She does not feel like it heightens the anxiety.  Individual Medical History/ Review of Systems: Changes? :No    Past medications for mental health diagnoses include: Xanax, Buspar wasn't effective, Cymbalta, Abilify, Chantix (only for a week)  Allergies: Aspirin and Sulfonamide derivatives  Current Medications:  Current Outpatient Medications:  .  acetaminophen (TYLENOL) 500 MG tablet, Take 500 mg by mouth every 6 (six) hours as needed for mild pain or moderate pain., Disp: , Rfl:  .  albuterol (PROVENTIL HFA;VENTOLIN HFA) 108 (90 BASE) MCG/ACT inhaler,  Inhale 2 puffs into the lungs every 6 (six) hours as needed for wheezing or shortness of breath., Disp: , Rfl:  .  ALPRAZolam (XANAX) 0.5 MG tablet, TAKE 1/2 TO 1 TABLET BY MOUTH TWICE DAILY AS NEEDED FOR ANXIETY. MUST LAST 30 DAYS., Disp: 20 tablet, Rfl: 0 .  ARIPiprazole (ABILIFY) 10 MG tablet, TAKE 1 TABLET(10 MG) BY MOUTH DAILY (Patient taking differently: Take 10 mg by mouth daily.), Disp: 90 tablet, Rfl: 1 .  DULoxetine (CYMBALTA) 60 MG capsule, TAKE 1 CAPSULE(60 MG) BY MOUTH DAILY, Disp: 90 capsule, Rfl: 1 .  fluticasone furoate-vilanterol (BREO ELLIPTA) 100-25 MCG/INH AEPB, Inhale 1 puff into the lungs daily at 12 noon., Disp: , Rfl:  .  hydrOXYzine (ATARAX/VISTARIL) 10 MG tablet, TAKE 1 TO 3 TABLETS(10 TO 30 MG) BY MOUTH THREE TIMES DAILY AS NEEDED FOR ANXIETY (Patient taking differently: Take 10 mg by mouth every 6 (six) hours as needed for anxiety.), Disp: 60 tablet, Rfl: 1 .  NARCAN 4 MG/0.1ML LIQD nasal spray kit, Place 4 mg into the nose once., Disp: , Rfl:  .  oxyCODONE-acetaminophen (PERCOCET) 10-325 MG tablet, Take 1 tablet by mouth 5 (five) times daily., Disp: , Rfl:  .  varenicline (CHANTIX) 0.5 MG tablet, 1 p.o. daily for 2 days, then 1 p.o. twice daily until gone.  Then she will take the 1 mg prescription (Patient taking differently: Take 0.5 mg by mouth See admin instructions. 1 p.o. daily for 2 days, then 1 p.o. twice daily until gone.  Then she  will take the 1 mg prescription), Disp: 30 tablet, Rfl: 0 .  [START ON 01/13/2021] amphetamine-dextroamphetamine (ADDERALL) 30 MG tablet, Take 1 tablet by mouth 2 (two) times daily., Disp: 60 tablet, Rfl: 0 .  [START ON 12/15/2020] amphetamine-dextroamphetamine (ADDERALL) 30 MG tablet, Take 1 tablet by mouth 2 (two) times daily., Disp: 60 tablet, Rfl: 0 .  amphetamine-dextroamphetamine (ADDERALL) 30 MG tablet, Take 1 tablet by mouth 2 (two) times daily., Disp: 60 tablet, Rfl: 0 .  varenicline (CHANTIX) 1 MG tablet, Take 1 tablet (1 mg total)  by mouth 2 (two) times daily. Start after completing the 2 weeks of 0.5 mg. (Patient not taking: Reported on 11/15/2020), Disp: 60 tablet, Rfl: 1 Medication Side Effects: none  Family Medical/ Social History: Changes?  No  MENTAL HEALTH EXAM:  There were no vitals taken for this visit.There is no height or weight on file to calculate BMI.  General Appearance: Casual, Neat and Well Groomed  Eye Contact:  Good  Speech:  Clear and Coherent and Normal Rate  Volume:  Normal  Mood:  Anxious  Affect:  Anxious  Thought Process:  Goal Directed and Descriptions of Associations: Intact  Orientation:  Full (Time, Place, and Person)  Thought Content: Logical   Suicidal Thoughts:  No  Homicidal Thoughts:  No  Memory:  WNL  Judgement:  Good  Insight:  Good  Psychomotor Activity:  Normal  Concentration:  Concentration: Good and Attention Span: Good  Recall:  Good  Fund of Knowledge: Good  Language: Good  Assets:  Desire for Improvement  ADL's:  Intact  Cognition: WNL  Prognosis:  Good   Most recent pertinent labs: 10/21/2020 CBC was normal CMP normal except creatinine was 1.1, EGFR 50, alk phos 138 Hemoglobin A1c 5.1 11/25/2019 Lipid panel total cholesterol 177, triglycerides 114, HDL 55, LDL 102  DIAGNOSES:    ICD-10-CM   1. Bipolar disorder in full remission, most recent episode unspecified type ([place])  F31.70   2. Attention deficit hyperactivity disorder (ADHD), unspecified ADHD type  F90.9   3. Anxiety state  F41.1   4. Smoker  F17.200     Receiving Psychotherapy: No    RECOMMENDATIONS:  PDMP was reviewed. I provided 40 minutes of face-to-face time during this encounter, including time spent before and after the visit and chart review.  We also discussed her diagnoses and treatment options.  I reviewed a note from her PCP on 09/14/2020 where it is documented that the patient requested additional alprazolam because she was only prescribed 20/month by me.  Her PCP explained to her  that it would not be appropriate for her to prescribe more Xanax that it should be managed by me.  PCP encouraged her to make me aware of her increased anxiety and the possible need to decrease the stimulant because it can aggravate anxiety.  When I discussed this with Stayce she said "they must have misunderstood.  I did not request more Xanax."  I again went over my recommendations for Al[place]sa to decrease and even wean off of the stimulant and any anxiety she may have after that we could treat more appropriately with Xanax increase if needed.  She understands that the Adderall is likely increasing the anxiety, she needs the Adderall to help her up because she needs opiates, and she needs Xanax to calm her down because of the Adderall.  It is a vicious cycle.  I do not doubt that she has anxiety or ADD but having both illnesses makes it  challenging to treat using both an upper and downer routinely is not appropriate.  Using the Xanax in the evenings on occasion is fine but not something she should do every day.  She elects to stay on the Adderall and deal with the anxiety in other ways. Smoking cessation counseling was given. Continue Xanax 0.5 mg, 1/2-1 p.o. twice daily as needed but uses sparingly as possible. Continue Adderall 30 mg, 1 p.o. twice daily. Continue Abilify 10 mg every morning. Continue Cymbalta 60 mg 1 p.o. every morning. Continue hydroxyzine 10 mg, 1-3 p.o. 3 times daily as needed. Continue Chantix as directed. Recommend counseling. Return in 4 months.  Donnal Moat, PA-C

## 2020-11-16 ENCOUNTER — Encounter (HOSPITAL_COMMUNITY): Payer: Self-pay

## 2020-11-17 ENCOUNTER — Encounter (HOSPITAL_COMMUNITY): Payer: Self-pay

## 2020-11-17 ENCOUNTER — Encounter (HOSPITAL_COMMUNITY)
Admission: RE | Admit: 2020-11-17 | Discharge: 2020-11-17 | Disposition: A | Discharge status: Home / Self Care | Payer: Medicare Other | Source: Ambulatory Visit | Attending: Orthopedic Surgery | Admitting: Orthopedic Surgery

## 2020-11-17 ENCOUNTER — Other Ambulatory Visit: Payer: Self-pay

## 2020-11-17 DIAGNOSIS — Z01812 Encounter for preprocedural laboratory examination: Secondary | ICD-10-CM | POA: Insufficient documentation

## 2020-11-17 HISTORY — DX: Emphysema, unspecified: J43.9

## 2020-11-17 HISTORY — DX: Chronic kidney disease, unspecified: N18.9

## 2020-11-17 HISTORY — DX: Bipolar disorder, unspecified: F31.9

## 2020-11-17 HISTORY — DX: Solitary pulmonary nodule: R91.1

## 2020-11-17 HISTORY — DX: Aneurysm of other specified arteries: I72.8

## 2020-11-17 LAB — URINALYSIS, ROUTINE W REFLEX MICROSCOPIC
Bilirubin Urine: NEGATIVE
Glucose, UA: NEGATIVE mg/dL
Hgb urine dipstick: NEGATIVE
Ketones, ur: NEGATIVE mg/dL
Leukocytes,Ua: NEGATIVE
Nitrite: NEGATIVE
Protein, ur: NEGATIVE mg/dL
Specific Gravity, Urine: 1.011 (ref 1.005–1.030)
pH: 5 (ref 5.0–8.0)

## 2020-11-17 LAB — COMPREHENSIVE METABOLIC PANEL
ALT: 75 U/L — ABNORMAL HIGH (ref 0–44)
AST: 42 U/L — ABNORMAL HIGH (ref 15–41)
Albumin: 3.8 g/dL (ref 3.5–5.0)
Alkaline Phosphatase: 128 U/L — ABNORMAL HIGH (ref 38–126)
Anion gap: 6 (ref 5–15)
BUN: 16 mg/dL (ref 8–23)
CO2: 28 mmol/L (ref 22–32)
Calcium: 9 mg/dL (ref 8.9–10.3)
Chloride: 107 mmol/L (ref 98–111)
Creatinine, Ser: 1.05 mg/dL — ABNORMAL HIGH (ref 0.44–1.00)
GFR, Estimated: 60 mL/min — ABNORMAL LOW (ref >60)
Glucose, Bld: 88 mg/dL (ref 70–99)
Potassium: 4.1 mmol/L (ref 3.5–5.1)
Sodium: 141 mmol/L (ref 135–145)
Total Bilirubin: 0.6 mg/dL (ref 0.3–1.2)
Total Protein: 7 g/dL (ref 6.5–8.1)

## 2020-11-17 LAB — SURGICAL PCR SCREEN
MRSA, PCR: NEGATIVE
Staphylococcus aureus: NEGATIVE

## 2020-11-17 LAB — CBC
HCT: 40.4 % (ref 36.0–46.0)
Hemoglobin: 13.4 g/dL (ref 12.0–15.0)
MCH: 30.7 pg (ref 26.0–34.0)
MCHC: 33.2 g/dL (ref 30.0–36.0)
MCV: 92.4 fL (ref 80.0–100.0)
Platelets: 172 10*3/uL (ref 150–400)
RBC: 4.37 MIL/uL (ref 3.87–5.11)
RDW: 12.7 % (ref 11.5–15.5)
WBC: 5.6 10*3/uL (ref 4.0–10.5)
nRBC: 0 % (ref 0.0–0.2)

## 2020-11-17 LAB — PROTIME-INR
INR: 0.9 (ref 0.8–1.2)
Prothrombin Time: 12.1 seconds (ref 11.4–15.2)

## 2020-11-22 ENCOUNTER — Other Ambulatory Visit (HOSPITAL_COMMUNITY)
Admission: RE | Admit: 2020-11-22 | Discharge: 2020-11-22 | Disposition: A | Discharge status: Home / Self Care | Payer: Medicare Other | Source: Ambulatory Visit | Attending: Orthopedic Surgery | Admitting: Orthopedic Surgery

## 2020-11-22 DIAGNOSIS — Z01812 Encounter for preprocedural laboratory examination: Secondary | ICD-10-CM | POA: Insufficient documentation

## 2020-11-22 DIAGNOSIS — Z20822 Contact with and (suspected) exposure to covid-19: Secondary | ICD-10-CM | POA: Diagnosis not present

## 2020-11-22 LAB — SARS CORONAVIRUS 2 (TAT 6-24 HRS): SARS Coronavirus 2: NEGATIVE

## 2020-11-24 ENCOUNTER — Encounter (HOSPITAL_COMMUNITY): Payer: Self-pay | Admitting: Orthopedic Surgery

## 2020-11-24 NOTE — Anesthesia Preprocedure Evaluation (Addendum)
Anesthesia Evaluation  Patient identified by MRN, date of birth, ID band Patient awake    Reviewed: Allergy & Precautions, NPO status , Patient's Chart, lab work & pertinent test results  Airway Mallampati: II  TM Distance: >3 FB Neck ROM: Full    Dental  (+) Dental Advisory Given   Pulmonary asthma , COPD, Current Smoker,    breath sounds clear to auscultation       Cardiovascular negative cardio ROS   Rhythm:Regular Rate:Normal     Neuro/Psych  Neuromuscular disease    GI/Hepatic negative GI ROS, Neg liver ROS,   Endo/Other  negative endocrine ROS  Renal/GU Renal InsufficiencyRenal disease     Musculoskeletal  (+) Arthritis ,   Abdominal   Peds  Hematology negative hematology ROS (+)   Anesthesia Other Findings   Reproductive/Obstetrics                            Lab Results  Component Value Date   WBC 5.6 11/17/2020   HGB 13.4 11/17/2020   HCT 40.4 11/17/2020   MCV 92.4 11/17/2020   PLT 172 11/17/2020   Lab Results  Component Value Date   CREATININE 1.05 (H) 11/17/2020   BUN 16 11/17/2020   NA 141 11/17/2020   K 4.1 11/17/2020   CL 107 11/17/2020   CO2 28 11/17/2020    Anesthesia Physical Anesthesia Plan  ASA: II  Anesthesia Plan: Spinal   Post-op Pain Management:    Induction:   PONV Risk Score and Plan: 1 and Propofol infusion and Treatment may vary due to age or medical condition  Airway Management Planned: Natural Airway and Simple Face Mask  Additional Equipment:   Intra-op Plan:   Post-operative Plan:   Informed Consent: I have reviewed the patients History and Physical, chart, labs and discussed the procedure including the risks, benefits and alternatives for the proposed anesthesia with the patient or authorized representative who has indicated his/her understanding and acceptance.       Plan Discussed with: CRNA  Anesthesia Plan Comments:         Anesthesia Quick Evaluation

## 2020-11-25 ENCOUNTER — Ambulatory Visit (HOSPITAL_COMMUNITY): Payer: Medicare Other

## 2020-11-25 ENCOUNTER — Encounter (HOSPITAL_COMMUNITY)
Admission: RE | Disposition: A | Discharge status: Home / Self Care | Payer: Self-pay | Source: Ambulatory Visit | Attending: Orthopedic Surgery

## 2020-11-25 ENCOUNTER — Other Ambulatory Visit: Payer: Self-pay

## 2020-11-25 ENCOUNTER — Ambulatory Visit (HOSPITAL_COMMUNITY): Payer: Medicare Other | Admitting: Anesthesiology

## 2020-11-25 ENCOUNTER — Encounter (HOSPITAL_COMMUNITY): Payer: Self-pay | Admitting: Orthopedic Surgery

## 2020-11-25 ENCOUNTER — Ambulatory Visit (HOSPITAL_COMMUNITY)
Admission: RE | Admit: 2020-11-25 | Discharge: 2020-11-26 | Disposition: A | Discharge status: Home / Self Care | Payer: Medicare Other | Source: Ambulatory Visit | Attending: Orthopedic Surgery | Admitting: Orthopedic Surgery

## 2020-11-25 ENCOUNTER — Ambulatory Visit (HOSPITAL_COMMUNITY): Payer: Medicare Other | Admitting: Physician Assistant

## 2020-11-25 DIAGNOSIS — M1611 Unilateral primary osteoarthritis, right hip: Secondary | ICD-10-CM | POA: Insufficient documentation

## 2020-11-25 DIAGNOSIS — Z09 Encounter for follow-up examination after completed treatment for conditions other than malignant neoplasm: Secondary | ICD-10-CM

## 2020-11-25 DIAGNOSIS — Z79899 Other long term (current) drug therapy: Secondary | ICD-10-CM | POA: Diagnosis not present

## 2020-11-25 DIAGNOSIS — Z419 Encounter for procedure for purposes other than remedying health state, unspecified: Secondary | ICD-10-CM

## 2020-11-25 HISTORY — PX: TOTAL HIP ARTHROPLASTY: SHX124

## 2020-11-25 LAB — TYPE AND SCREEN
ABO/RH(D): B NEG
Antibody Screen: NEGATIVE

## 2020-11-25 LAB — ABO/RH: ABO/RH(D): B NEG

## 2020-11-25 IMAGING — RF DG HIP (WITH PELVIS) OPERATIVE*R*
1 series · 2 of 2 positions shown · non-contrast
Comparison: None.

CLINICAL DATA: Right hip arthroplasty, intraoperative examination

EXAM:
OPERATIVE RIGHT HIP (WITH PELVIS IF PERFORMED) SINGLE VIEWS
TECHNIQUE: Fluoroscopic spot image(s) were submitted for interpretation
post-operatively.

[Series 1: unknown protocol · 0.20mm/px · 2 of 2 slices shown]
[im 1/2]
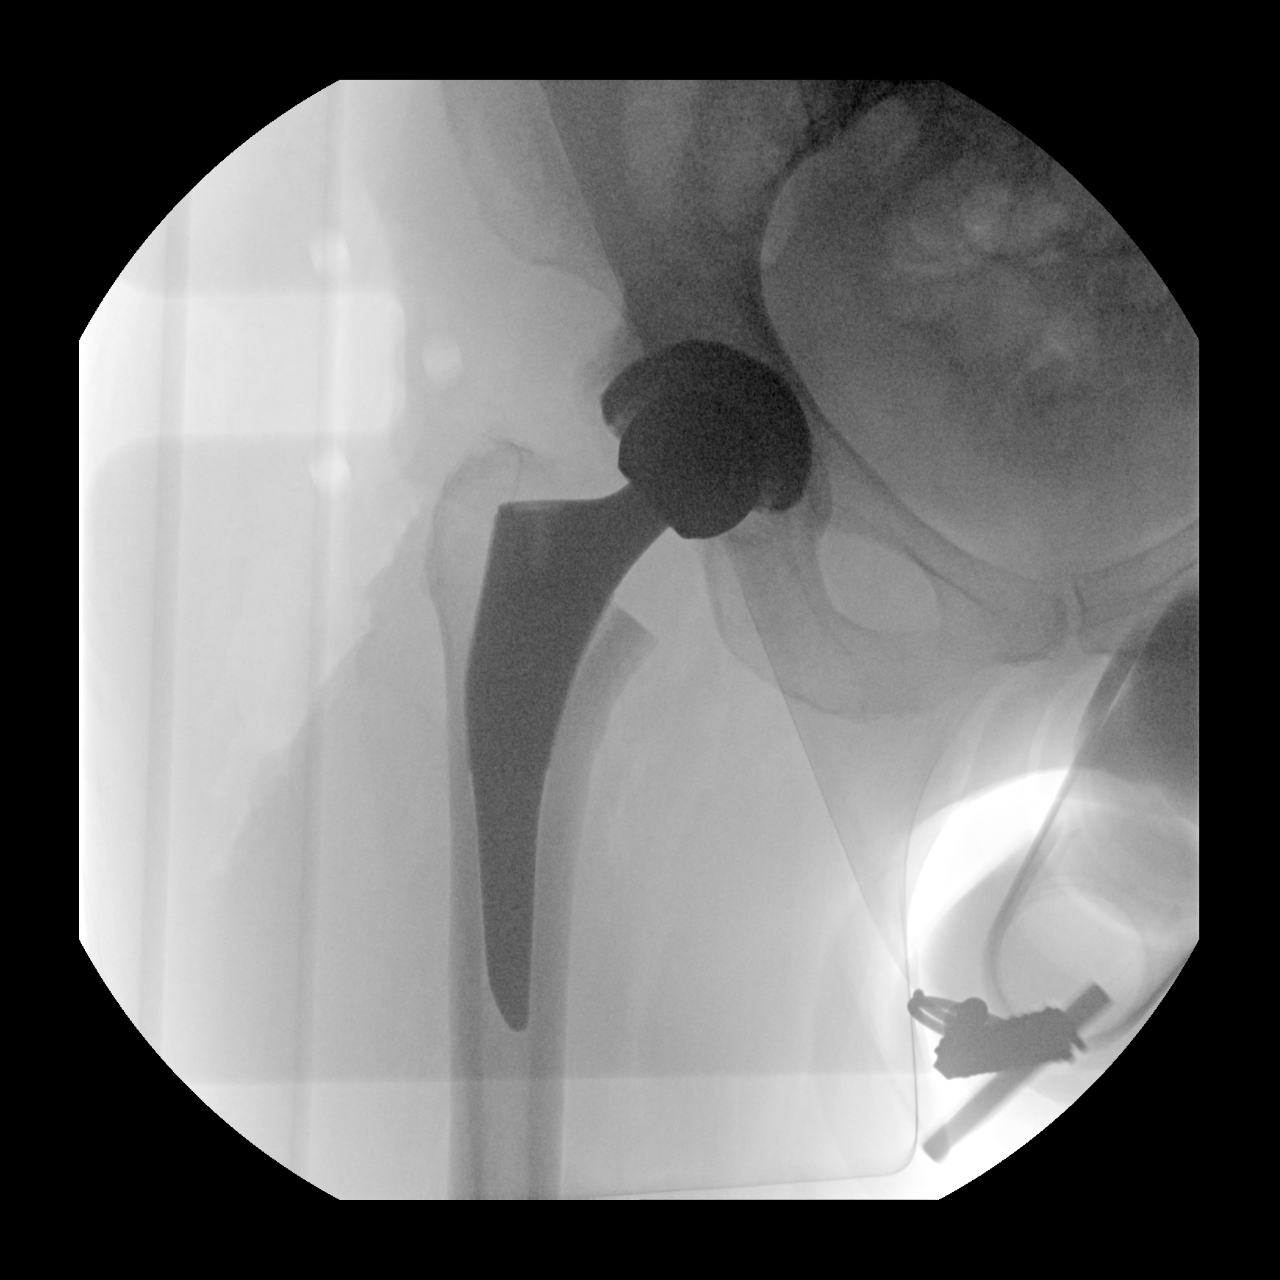
[im 2/2]
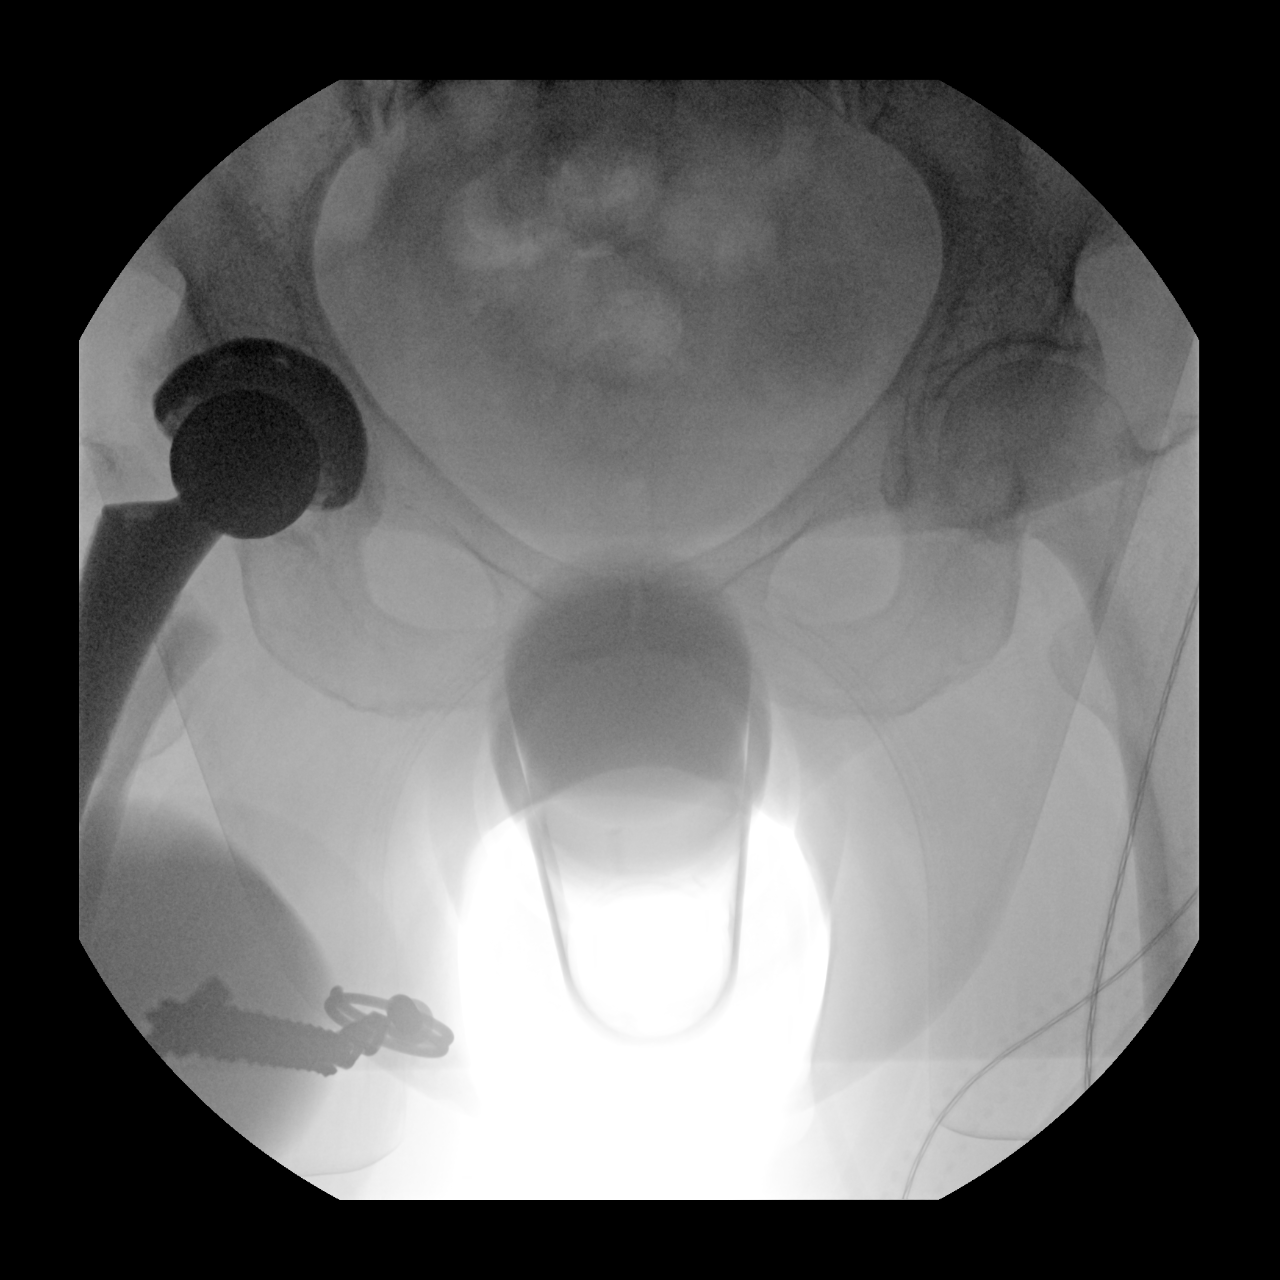

[2 of 2 positions shown; findings below may reference images not displayed]

FINDINGS: Two fluoroscopic intraoperative radiographs are presented for
interpretation postprocedurally. These images demonstrate surgical
changes of right total hip arthroplasty with arthroplasty components
overlying the expected position. No unexpected fracture or
dislocation on this limited examination. Defect within the lateral
soft tissues is in keeping with soft tissue retraction.

FLUOROSCOPY TIME:  Time: 0.3 minutes

Images: 2

Dose: 2.2142 mGy
IMPRESSION: Status post right total hip arthroplasty.

## 2020-11-25 IMAGING — DX DG PORTABLE PELVIS
1 series · 1 of 1 positions shown · non-contrast
Comparison: None.

CLINICAL DATA: Right hip arthroplasty

EXAM:
PORTABLE PELVIS 1-2 VIEWS

[pelvis ap]
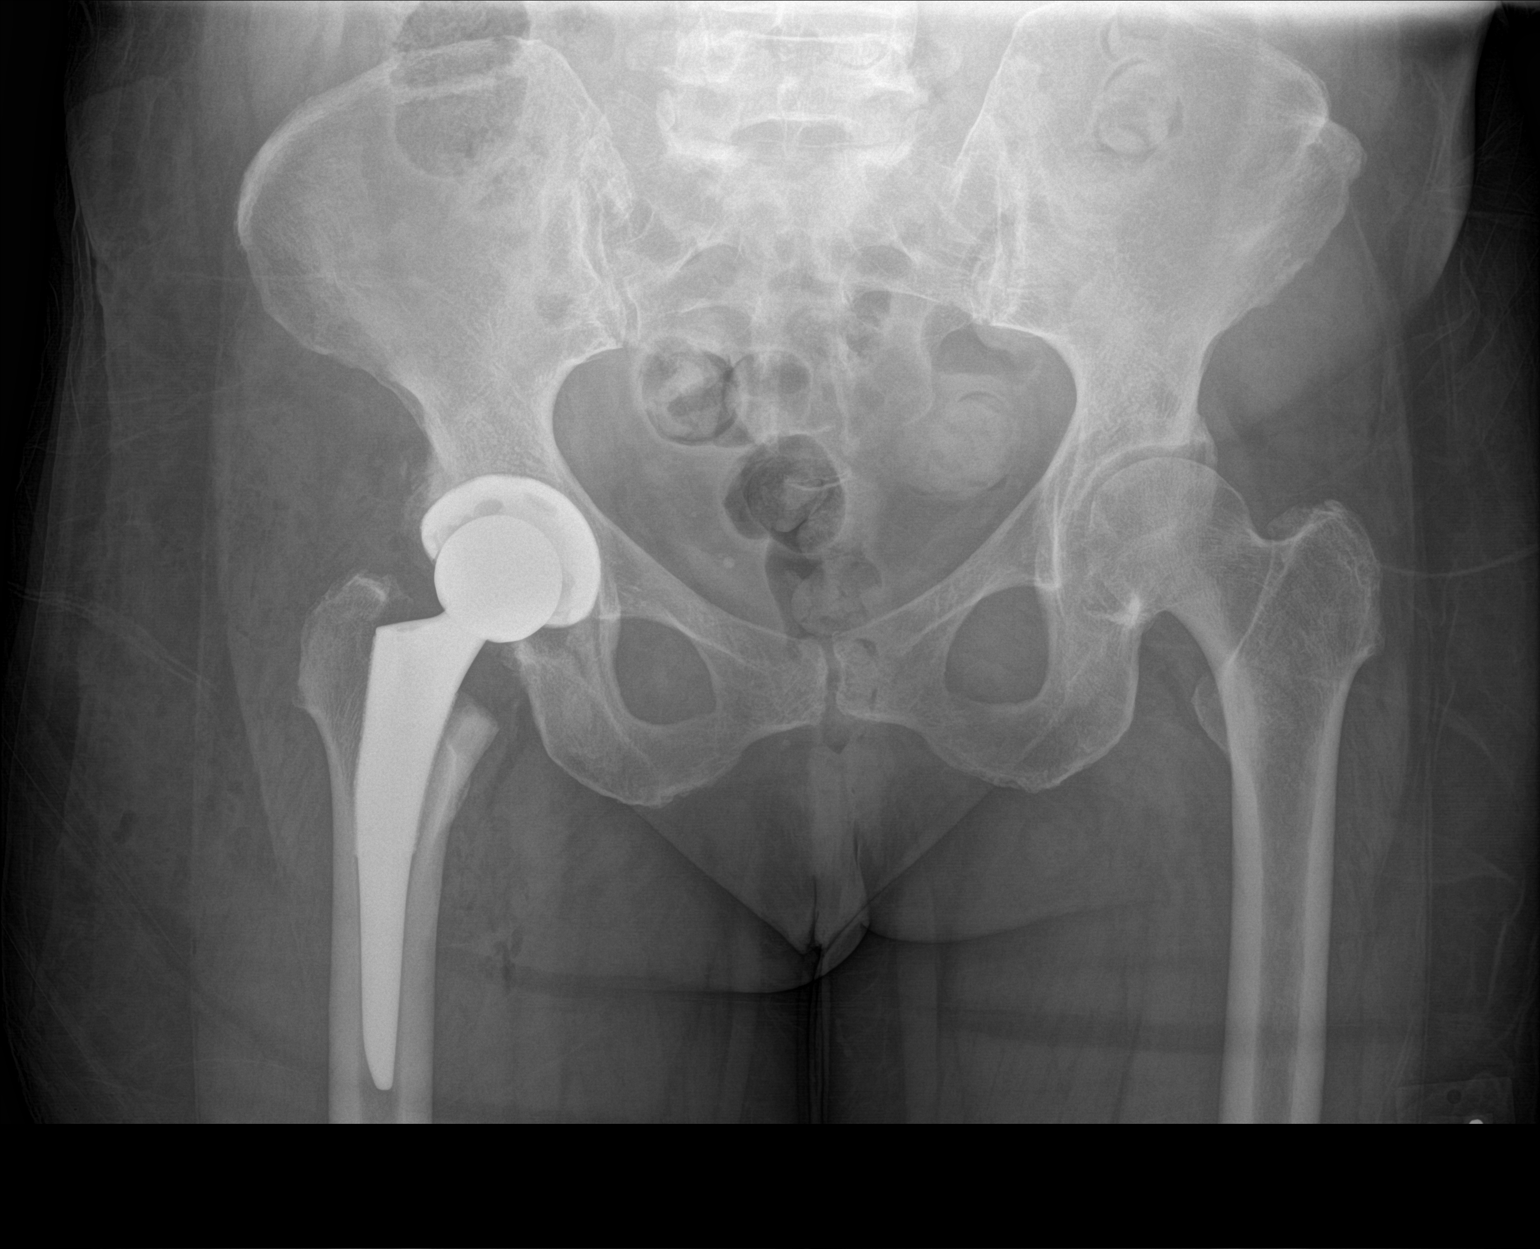

[1 of 1 positions shown; findings below may reference images not displayed]

FINDINGS: Single view radiograph of the pelvis demonstrates surgical changes
of right total hip arthroplasty. Arthroplasty components overlie the
expected position. No unexpected fracture or dislocation. Limited
evaluation of the left hip is unremarkable. Gas is seen within the
soft tissues surrounding the right hip.
IMPRESSION: Status post right total hip arthroplasty. No unexpected fracture or
dislocation.

## 2020-11-25 SURGERY — ARTHROPLASTY, HIP, TOTAL, ANTERIOR APPROACH
Anesthesia: Spinal | Site: Hip | Laterality: Right

## 2020-11-25 MED ORDER — FENTANYL CITRATE (PF) 100 MCG/2ML IJ SOLN
INTRAMUSCULAR | Status: AC
  Filled 2020-11-25: qty 2

## 2020-11-25 MED ORDER — TRANEXAMIC ACID-NACL 1000-0.7 MG/100ML-% IV SOLN
1000.0000 mg | INTRAVENOUS | Status: AC
  Administered 2020-11-25: 1000 mg via INTRAVENOUS
  Filled 2020-11-25: qty 100

## 2020-11-25 MED ORDER — DIPHENHYDRAMINE HCL 12.5 MG/5ML PO ELIX: 12.5000 mg | ORAL_SOLUTION | ORAL | Status: DC | PRN

## 2020-11-25 MED ORDER — FENTANYL CITRATE (PF) 100 MCG/2ML IJ SOLN: 25.0000 ug | INTRAMUSCULAR | Status: DC | PRN

## 2020-11-25 MED ORDER — METHOCARBAMOL 500 MG PO TABS
500.0000 mg | ORAL_TABLET | Freq: Four times a day (QID) | ORAL | Status: DC | PRN
  Administered 2020-11-25 – 2020-11-26 (×2): 500 mg via ORAL
  Filled 2020-11-25 (×2): qty 1

## 2020-11-25 MED ORDER — ORAL CARE MOUTH RINSE
15.0000 mL | Freq: Once | OROMUCOSAL | Status: AC
  Administered 2020-11-25: 15 mL via OROMUCOSAL

## 2020-11-25 MED ORDER — MIDAZOLAM HCL 5 MG/5ML IJ SOLN
INTRAMUSCULAR | Status: DC | PRN
  Administered 2020-11-25: 2 mg via INTRAVENOUS

## 2020-11-25 MED ORDER — SURGIRINSE WOUND IRRIGATION SYSTEM - OPTIME: TOPICAL | Status: DC | PRN

## 2020-11-25 MED ORDER — ALPRAZOLAM 0.5 MG PO TABS
0.5000 mg | ORAL_TABLET | Freq: Two times a day (BID) | ORAL | Status: DC | PRN
  Administered 2020-11-25 – 2020-11-26 (×3): 0.5 mg via ORAL
  Filled 2020-11-25 (×3): qty 1

## 2020-11-25 MED ORDER — BUPIVACAINE IN DEXTROSE 0.75-8.25 % IT SOLN
INTRATHECAL | Status: DC | PRN
  Administered 2020-11-25: 2 mL via INTRATHECAL

## 2020-11-25 MED ORDER — ACETAMINOPHEN 10 MG/ML IV SOLN
1000.0000 mg | Freq: Once | INTRAVENOUS | Status: DC
  Filled 2020-11-25: qty 100

## 2020-11-25 MED ORDER — ALBUTEROL SULFATE HFA 108 (90 BASE) MCG/ACT IN AERS: 2.0000 | INHALATION_SPRAY | Freq: Four times a day (QID) | RESPIRATORY_TRACT | Status: DC | PRN

## 2020-11-25 MED ORDER — METOCLOPRAMIDE HCL 5 MG/ML IJ SOLN
5.0000 mg | Freq: Three times a day (TID) | INTRAMUSCULAR | Status: DC | PRN
Start: 2020-11-25 — End: 2020-11-26

## 2020-11-25 MED ORDER — SODIUM CHLORIDE (PF) 0.9 % IJ SOLN
INTRAMUSCULAR | Status: AC
  Filled 2020-11-25: qty 30

## 2020-11-25 MED ORDER — SODIUM CHLORIDE 0.9 % IV SOLN: INTRAVENOUS | Status: DC

## 2020-11-25 MED ORDER — PROPOFOL 10 MG/ML IV BOLUS
INTRAVENOUS | Status: DC | PRN
  Administered 2020-11-25: 20 mg via INTRAVENOUS

## 2020-11-25 MED ORDER — PROPOFOL 500 MG/50ML IV EMUL
INTRAVENOUS | Status: DC | PRN
  Administered 2020-11-25: 115 ug/kg/min via INTRAVENOUS

## 2020-11-25 MED ORDER — PHENYLEPHRINE HCL-NACL 10-0.9 MG/250ML-% IV SOLN
INTRAVENOUS | Status: DC | PRN
  Administered 2020-11-25: 30 ug/min via INTRAVENOUS

## 2020-11-25 MED ORDER — HYDROMORPHONE HCL 1 MG/ML IJ SOLN: 0.5000 mg | INTRAMUSCULAR | Status: DC | PRN

## 2020-11-25 MED ORDER — ARIPIPRAZOLE 10 MG PO TABS
10.0000 mg | ORAL_TABLET | Freq: Every day | ORAL | Status: DC
  Administered 2020-11-25 – 2020-11-26 (×2): 10 mg via ORAL
  Filled 2020-11-25 (×2): qty 1

## 2020-11-25 MED ORDER — POVIDONE-IODINE 10 % EX SWAB: 2.0000 "application " | Freq: Once | CUTANEOUS | Status: DC

## 2020-11-25 MED ORDER — PHENYLEPHRINE HCL-NACL 10-0.9 MG/250ML-% IV SOLN
INTRAVENOUS | Status: AC
  Filled 2020-11-25: qty 750

## 2020-11-25 MED ORDER — ACETAMINOPHEN 500 MG PO TABS
1000.0000 mg | ORAL_TABLET | Freq: Once | ORAL | Status: AC
  Administered 2020-11-25: 1000 mg via ORAL
  Filled 2020-11-25: qty 2

## 2020-11-25 MED ORDER — KETOROLAC TROMETHAMINE 30 MG/ML IJ SOLN
INTRAMUSCULAR | Status: DC | PRN
  Administered 2020-11-25: 30 mg via INTRAMUSCULAR

## 2020-11-25 MED ORDER — HYDROXYZINE HCL 10 MG PO TABS
10.0000 mg | ORAL_TABLET | Freq: Four times a day (QID) | ORAL | Status: DC | PRN
  Filled 2020-11-25: qty 1

## 2020-11-25 MED ORDER — GABAPENTIN 300 MG PO CAPS
300.0000 mg | ORAL_CAPSULE | Freq: Once | ORAL | Status: AC
  Administered 2020-11-25: 300 mg via ORAL
  Filled 2020-11-25: qty 1

## 2020-11-25 MED ORDER — OXYCODONE HCL 5 MG PO TABS
5.0000 mg | ORAL_TABLET | ORAL | Status: DC | PRN
  Administered 2020-11-25: 5 mg via ORAL
  Administered 2020-11-25 – 2020-11-26 (×5): 10 mg via ORAL
  Filled 2020-11-25 (×6): qty 2

## 2020-11-25 MED ORDER — MENTHOL 3 MG MT LOZG: 1.0000 | LOZENGE | OROMUCOSAL | Status: DC | PRN

## 2020-11-25 MED ORDER — EPHEDRINE 5 MG/ML INJ
INTRAVENOUS | Status: AC
  Filled 2020-11-25: qty 10

## 2020-11-25 MED ORDER — SODIUM CHLORIDE 0.9 % IR SOLN
Status: DC | PRN
  Administered 2020-11-25: 1000 mL

## 2020-11-25 MED ORDER — LACTATED RINGERS IV SOLN: INTRAVENOUS | Status: DC

## 2020-11-25 MED ORDER — ONDANSETRON HCL 4 MG/2ML IJ SOLN: 4.0000 mg | Freq: Four times a day (QID) | INTRAMUSCULAR | Status: DC | PRN

## 2020-11-25 MED ORDER — ACETAMINOPHEN 325 MG PO TABS
325.0000 mg | ORAL_TABLET | Freq: Four times a day (QID) | ORAL | Status: DC | PRN
  Administered 2020-11-26: 650 mg via ORAL
  Filled 2020-11-25: qty 2

## 2020-11-25 MED ORDER — BUPIVACAINE-EPINEPHRINE 0.25% -1:200000 IJ SOLN
INTRAMUSCULAR | Status: DC | PRN
  Administered 2020-11-25: 30 mL

## 2020-11-25 MED ORDER — NALOXONE HCL 4 MG/0.1ML NA LIQD: 4.0000 mg | Freq: Once | NASAL | Status: DC

## 2020-11-25 MED ORDER — AMPHETAMINE-DEXTROAMPHETAMINE 30 MG PO TABS: 30.0000 mg | ORAL_TABLET | Freq: Two times a day (BID) | ORAL | Status: DC

## 2020-11-25 MED ORDER — FLUTICASONE FUROATE-VILANTEROL 100-25 MCG/INH IN AEPB
1.0000 | INHALATION_SPRAY | Freq: Every day | RESPIRATORY_TRACT | Status: DC
  Administered 2020-11-26: 1 via RESPIRATORY_TRACT
  Filled 2020-11-25: qty 28

## 2020-11-25 MED ORDER — OXYCODONE HCL 5 MG PO TABS
10.0000 mg | ORAL_TABLET | ORAL | Status: DC | PRN
Start: 2020-11-25 — End: 2020-11-26

## 2020-11-25 MED ORDER — SODIUM CHLORIDE (PF) 0.9 % IJ SOLN
INTRAMUSCULAR | Status: DC | PRN
  Administered 2020-11-25: 1000 mL

## 2020-11-25 MED ORDER — CEFAZOLIN SODIUM-DEXTROSE 2-4 GM/100ML-% IV SOLN
2.0000 g | INTRAVENOUS | Status: AC
  Administered 2020-11-25: 2 g via INTRAVENOUS
  Filled 2020-11-25: qty 100

## 2020-11-25 MED ORDER — DOCUSATE SODIUM 100 MG PO CAPS
100.0000 mg | ORAL_CAPSULE | Freq: Two times a day (BID) | ORAL | Status: DC
  Administered 2020-11-25 – 2020-11-26 (×2): 100 mg via ORAL
  Filled 2020-11-25 (×2): qty 1

## 2020-11-25 MED ORDER — AMISULPRIDE (ANTIEMETIC) 5 MG/2ML IV SOLN: 10.0000 mg | Freq: Once | INTRAVENOUS | Status: DC | PRN

## 2020-11-25 MED ORDER — FENTANYL CITRATE (PF) 100 MCG/2ML IJ SOLN
INTRAMUSCULAR | Status: DC | PRN
  Administered 2020-11-25: 50 ug via INTRAVENOUS

## 2020-11-25 MED ORDER — ALUM & MAG HYDROXIDE-SIMETH 200-200-20 MG/5ML PO SUSP: 30.0000 mL | ORAL | Status: DC | PRN

## 2020-11-25 MED ORDER — PROPOFOL 1000 MG/100ML IV EMUL
INTRAVENOUS | Status: AC
  Filled 2020-11-25: qty 100

## 2020-11-25 MED ORDER — AMPHETAMINE-DEXTROAMPHETAMINE 20 MG PO TABS
30.0000 mg | ORAL_TABLET | Freq: Two times a day (BID) | ORAL | Status: DC
  Filled 2020-11-25: qty 1

## 2020-11-25 MED ORDER — SENNA 8.6 MG PO TABS
1.0000 | ORAL_TABLET | Freq: Two times a day (BID) | ORAL | Status: DC
  Administered 2020-11-25 – 2020-11-26 (×2): 8.6 mg via ORAL
  Filled 2020-11-25 (×2): qty 1

## 2020-11-25 MED ORDER — ALBUMIN HUMAN 5 % IV SOLN: INTRAVENOUS | Status: DC | PRN

## 2020-11-25 MED ORDER — ONDANSETRON HCL 4 MG PO TABS: 4.0000 mg | ORAL_TABLET | Freq: Four times a day (QID) | ORAL | Status: DC | PRN

## 2020-11-25 MED ORDER — PROPOFOL 10 MG/ML IV BOLUS
INTRAVENOUS | Status: AC
  Filled 2020-11-25: qty 40

## 2020-11-25 MED ORDER — DULOXETINE HCL 60 MG PO CPEP
60.0000 mg | ORAL_CAPSULE | Freq: Every day | ORAL | Status: DC
  Administered 2020-11-25 – 2020-11-26 (×2): 60 mg via ORAL
  Filled 2020-11-25 (×2): qty 1

## 2020-11-25 MED ORDER — EPHEDRINE SULFATE-NACL 50-0.9 MG/10ML-% IV SOSY
PREFILLED_SYRINGE | INTRAVENOUS | Status: DC | PRN
  Administered 2020-11-25 (×2): 5 mg via INTRAVENOUS

## 2020-11-25 MED ORDER — POLYETHYLENE GLYCOL 3350 17 G PO PACK: 17.0000 g | PACK | Freq: Every day | ORAL | Status: DC | PRN

## 2020-11-25 MED ORDER — ALBUMIN HUMAN 5 % IV SOLN
INTRAVENOUS | Status: AC
  Filled 2020-11-25: qty 250

## 2020-11-25 MED ORDER — BUPIVACAINE-EPINEPHRINE (PF) 0.25% -1:200000 IJ SOLN
INTRAMUSCULAR | Status: AC
  Filled 2020-11-25: qty 30

## 2020-11-25 MED ORDER — DEXAMETHASONE SODIUM PHOSPHATE 10 MG/ML IJ SOLN
10.0000 mg | Freq: Once | INTRAMUSCULAR | Status: AC
  Administered 2020-11-26: 10 mg via INTRAVENOUS
  Filled 2020-11-25: qty 1

## 2020-11-25 MED ORDER — TRANEXAMIC ACID-NACL 1000-0.7 MG/100ML-% IV SOLN
1000.0000 mg | Freq: Once | INTRAVENOUS | Status: AC
  Administered 2020-11-25: 1000 mg via INTRAVENOUS
  Filled 2020-11-25: qty 100

## 2020-11-25 MED ORDER — APIXABAN 2.5 MG PO TABS
2.5000 mg | ORAL_TABLET | Freq: Two times a day (BID) | ORAL | Status: DC
  Administered 2020-11-26: 2.5 mg via ORAL
  Filled 2020-11-25: qty 1

## 2020-11-25 MED ORDER — PHENOL 1.4 % MT LIQD: 1.0000 | OROMUCOSAL | Status: DC | PRN

## 2020-11-25 MED ORDER — POVIDONE-IODINE 10 % EX SWAB
2.0000 "application " | Freq: Once | CUTANEOUS | Status: AC
  Administered 2020-11-25: 2 via TOPICAL

## 2020-11-25 MED ORDER — CHLORHEXIDINE GLUCONATE 0.12 % MT SOLN: 15.0000 mL | Freq: Once | OROMUCOSAL | Status: AC

## 2020-11-25 MED ORDER — MIDAZOLAM HCL 2 MG/2ML IJ SOLN
INTRAMUSCULAR | Status: AC
  Filled 2020-11-25: qty 2

## 2020-11-25 MED ORDER — AMPHETAMINE-DEXTROAMPHETAMINE 10 MG PO TABS
30.0000 mg | ORAL_TABLET | Freq: Two times a day (BID) | ORAL | Status: DC
  Administered 2020-11-26: 30 mg via ORAL
  Filled 2020-11-25: qty 3

## 2020-11-25 MED ORDER — DEXAMETHASONE SODIUM PHOSPHATE 10 MG/ML IJ SOLN
INTRAMUSCULAR | Status: DC | PRN
  Administered 2020-11-25: 4 mg via INTRAVENOUS

## 2020-11-25 MED ORDER — METHOCARBAMOL 500 MG IVPB - SIMPLE MED
500.0000 mg | Freq: Four times a day (QID) | INTRAVENOUS | Status: DC | PRN
  Filled 2020-11-25: qty 50

## 2020-11-25 MED ORDER — PROPOFOL 500 MG/50ML IV EMUL
INTRAVENOUS | Status: AC
  Filled 2020-11-25: qty 50

## 2020-11-25 MED ORDER — METOCLOPRAMIDE HCL 5 MG PO TABS: 5.0000 mg | ORAL_TABLET | Freq: Three times a day (TID) | ORAL | Status: DC | PRN

## 2020-11-25 MED ORDER — CEFAZOLIN SODIUM-DEXTROSE 2-4 GM/100ML-% IV SOLN
2.0000 g | Freq: Four times a day (QID) | INTRAVENOUS | Status: AC
  Administered 2020-11-25 (×2): 2 g via INTRAVENOUS
  Filled 2020-11-25 (×2): qty 100

## 2020-11-25 MED ORDER — ONDANSETRON HCL 4 MG/2ML IJ SOLN
INTRAMUSCULAR | Status: DC | PRN
  Administered 2020-11-25: 4 mg via INTRAVENOUS

## 2020-11-25 MED ORDER — KETOROLAC TROMETHAMINE 30 MG/ML IJ SOLN
INTRAMUSCULAR | Status: AC
  Filled 2020-11-25: qty 1

## 2020-11-25 SURGICAL SUPPLY — 56 items
ADH SKN CLS APL DERMABOND .7 (GAUZE/BANDAGES/DRESSINGS) ×1
APL PRP STRL LF DISP 70% ISPRP (MISCELLANEOUS) ×1
CHLORAPREP W/TINT 26 (MISCELLANEOUS) ×2 IMPLANT
COVER PERINEAL POST (MISCELLANEOUS) ×2 IMPLANT
COVER SURGICAL LIGHT HANDLE (MISCELLANEOUS) ×2 IMPLANT
CUP ACET PINNACLE SECTR 48MM (Joint) IMPLANT
DERMABOND ADVANCED (GAUZE/BANDAGES/DRESSINGS) ×1
DERMABOND ADVANCED .7 DNX12 (GAUZE/BANDAGES/DRESSINGS) IMPLANT
DRAPE IMP U-DRAPE 54X76 (DRAPES) ×2 IMPLANT
DRAPE SHEET LG 3/4 BI-LAMINATE (DRAPES) ×6 IMPLANT
DRAPE STERI IOBAN 125X83 (DRAPES) ×1 IMPLANT
DRAPE U-SHAPE 47X51 STRL (DRAPES) ×4 IMPLANT
DRESSING AQUACEL AG SP 3.5X10 (GAUZE/BANDAGES/DRESSINGS) IMPLANT
DRSG AQUACEL AG ADV 3.5X10 (GAUZE/BANDAGES/DRESSINGS) ×2 IMPLANT
DRSG AQUACEL AG SP 3.5X10 (GAUZE/BANDAGES/DRESSINGS) ×2
ELECT REM PT RETURN 15FT ADLT (MISCELLANEOUS) ×2 IMPLANT
GAUZE SPONGE 4X4 12PLY STRL (GAUZE/BANDAGES/DRESSINGS) ×2 IMPLANT
GLOVE SRG 8 PF TXTR STRL LF DI (GLOVE) ×1 IMPLANT
GLOVE SURG ENC MOIS LTX SZ8.5 (GLOVE) ×4 IMPLANT
GLOVE SURG ENC TEXT LTX SZ7.5 (GLOVE) ×4 IMPLANT
GLOVE SURG UNDER POLY LF SZ8 (GLOVE) ×2
GLOVE SURG UNDER POLY LF SZ8.5 (GLOVE) ×2 IMPLANT
GOWN SPEC L3 XXLG W/TWL (GOWN DISPOSABLE) ×2 IMPLANT
GOWN STRL REUS W/ TWL LRG LVL3 (GOWN DISPOSABLE) ×1 IMPLANT
GOWN STRL REUS W/TWL LRG LVL3 (GOWN DISPOSABLE) ×2
HANDPIECE INTERPULSE COAX TIP (DISPOSABLE) ×2
HEAD FEMORAL 32 CERAMIC (Hips) ×1 IMPLANT
HOLDER FOLEY CATH W/STRAP (MISCELLANEOUS) ×2 IMPLANT
HOOD PEEL AWAY FLYTE STAYCOOL (MISCELLANEOUS) ×8 IMPLANT
KIT TURNOVER KIT A (KITS) ×2 IMPLANT
MANIFOLD NEPTUNE II (INSTRUMENTS) ×2 IMPLANT
MARKER SKIN DUAL TIP RULER LAB (MISCELLANEOUS) ×2 IMPLANT
NDL SAFETY ECLIPSE 18X1.5 (NEEDLE) ×1 IMPLANT
NDL SPNL 18GX3.5 QUINCKE PK (NEEDLE) ×1 IMPLANT
NEEDLE HYPO 18GX1.5 SHARP (NEEDLE) ×2
NEEDLE SPNL 18GX3.5 QUINCKE PK (NEEDLE) ×2 IMPLANT
PACK ANTERIOR HIP CUSTOM (KITS) ×2 IMPLANT
PINN ALTRX NEUT ID X OD 32X48 ×1 IMPLANT
PINNSECTOR W/GRIP ACE CUP 48MM (Joint) ×2 IMPLANT
SAW OSC TIP CART 19.5X105X1.3 (SAW) ×2 IMPLANT
SEALER BIPOLAR AQUA 6.0 (INSTRUMENTS) ×2 IMPLANT
SET HNDPC FAN SPRY TIP SCT (DISPOSABLE) ×1 IMPLANT
STEM TRI LOC BPS GRIPTON SZ 2 IMPLANT
SUT ETHIBOND NAB CT1 #1 30IN (SUTURE) ×4 IMPLANT
SUT MNCRL AB 3-0 PS2 18 (SUTURE) ×2 IMPLANT
SUT MNCRL AB 4-0 PS2 18 (SUTURE) ×2 IMPLANT
SUT MON AB 2-0 CT1 36 (SUTURE) ×4 IMPLANT
SUT STRATAFIX PDO 1 14 VIOLET (SUTURE) ×2
SUT STRATFX PDO 1 14 VIOLET (SUTURE) ×1
SUT VIC AB 2-0 CT1 27 (SUTURE) ×2
SUT VIC AB 2-0 CT1 TAPERPNT 27 (SUTURE) ×1 IMPLANT
SUTURE STRATFX PDO 1 14 VIOLET (SUTURE) ×1 IMPLANT
SYR 3ML LL SCALE MARK (SYRINGE) ×2 IMPLANT
TRI LOC BPS W GRIPTON SZ 2 ×2 IMPLANT
TUBE SUCTION HIGH CAP CLEAR NV (SUCTIONS) ×2 IMPLANT
WATER STERILE IRR 1000ML POUR (IV SOLUTION) ×2 IMPLANT

## 2020-11-25 NOTE — Plan of Care (Signed)
  Problem: Education: Goal: Knowledge of General Education information will improve Description: Including pain rating scale, medication(s)/side effects and non-pharmacologic comfort measures Outcome: Progressing Note: Handbook given   Problem: Activity: Goal: Risk for activity intolerance will decrease Outcome: Progressing   Problem: Nutrition: Goal: Adequate nutrition will be maintained Outcome: Progressing   Problem: Elimination: Goal: Will not experience complications related to bowel motility Outcome: Progressing   Problem: Pain Managment: Goal: General experience of comfort will improve Outcome: Progressing   Problem: Safety: Goal: Ability to remain free from injury will improve Outcome: Progressing   Problem: Education: Goal: Knowledge of the prescribed therapeutic regimen will improve Outcome: Progressing   Problem: Activity: Goal: Ability to avoid complications of mobility impairment will improve Outcome: Progressing   Problem: Pain Management: Goal: Pain level will decrease with appropriate interventions Outcome: Progressing

## 2020-11-25 NOTE — Interval H&P Note (Signed)
History and Physical Interval Note:  11/25/2020 7:36 AM  Diana Alvarado  has presented today for surgery, with the diagnosis of right hip osteoarthritis.  The various methods of treatment have been discussed with the patient and family. After consideration of risks, benefits and other options for treatment, the patient has consented to  Procedure(s): TOTAL HIP ARTHROPLASTY ANTERIOR APPROACH (Right) as a surgical intervention.  The patient's history has been reviewed, patient examined, no change in status, stable for surgery.  I have reviewed the patient's chart and labs.  Questions were answered to the patient's satisfaction.     Hilton Cork Camila Norville

## 2020-11-25 NOTE — Op Note (Signed)
OPERATIVE REPORT  SURGEON: Rod Can, MD   ASSISTANT: Cherlynn June, PA-C.  PREOPERATIVE DIAGNOSIS: Right hip arthritis.   POSTOPERATIVE DIAGNOSIS: Right hip arthritis.   PROCEDURE: Right total hip arthroplasty, anterior approach.   IMPLANTS: DePuy Tri Lock stem, size 2, hi offset. DePuy Pinnacle Cup, size 48 mm. DePuy Altrx liner, size 32 by 48 mm, +4 neutral. DePuy Biolox ceramic head ball, size 32 + 1 mm.  ANESTHESIA:  MAC and Spinal  ESTIMATED BLOOD LOSS:-500 mL    ANTIBIOTICS: 2g Ancef.  DRAINS: None.  COMPLICATIONS: None.   CONDITION: PACU - hemodynamically stable.   BRIEF CLINICAL NOTE: Diana Alvarado is a 65 y.o. female with a long-standing history of Right hip arthritis. After failing conservative management, the patient was indicated for total hip arthroplasty. The risks, benefits, and alternatives to the procedure were explained, and the patient elected to proceed.  PROCEDURE IN DETAIL: Surgical site was marked by myself in the pre-op holding area. Once inside the operating room, spinal anesthesia was obtained, and a foley catheter was inserted. The patient was then positioned on the Hana table.  All bony prominences were well padded.  The hip was prepped and draped in the normal sterile surgical fashion.  A time-out was called verifying side and site of surgery. The patient received IV antibiotics within 60 minutes of beginning the procedure.   The direct anterior approach to the hip was performed through the Hueter interval.  Lateral femoral circumflex vessels were treated with the Auqumantys. The anterior capsule was exposed and an inverted T capsulotomy was made. The femoral neck cut was made to the level of the templated cut.  A corkscrew was placed into the head and the head was removed.  The femoral head was found to have eburnated bone. The head was passed to the back table and was measured.   Acetabular exposure was achieved, and the pulvinar and labrum  were excised. Sequential reaming of the acetabulum was then performed up to a size 47 mm reamer. A 48 mm cup was then opened and impacted into place at approximately 40 degrees of abduction and 20 degrees of anteversion. The final polyethylene liner was impacted into place and acetabular osteophytes were removed.    I then gained femoral exposure taking care to protect the abductors and greater trochanter.  This was performed using standard external rotation, extension, and adduction.  The capsule was peeled off the inner aspect of the greater trochanter, taking care to preserve the short external rotators. A cookie cutter was used to enter the femoral canal, and then the femoral canal finder was placed.  Sequential broaching was performed up to a size 2.  Calcar planer was used on the femoral neck remnant.  I placed a hi offset neck and a trial head ball.  The hip was reduced.  Leg lengths and offset were checked fluoroscopically.  The hip was dislocated and trial components were removed.  The final implants were placed, and the hip was reduced.  Fluoroscopy was used to confirm component position and leg lengths.  At 90 degrees of external rotation and full extension, the hip was stable to an anterior directed force. Lengthening of a few millimeters was required to achieve adequate soft tissue tension and stability.    The wound was copiously irrigated with Irrisept solution and normal saline using pule lavage.  Marcaine solution was injected into the periarticular soft tissue.  The wound was closed in layers using #1 Stratafix for the fascia, 2-0 Vicryl  for the subcutaneous fat, 2-0 Monocryl for the deep dermal layer, 3-0 running Monocryl subcuticular stitch, and Dermabond for the skin.  Once the glue was fully dried, an Aquacell Ag dressing was applied.  The patient was transported to the recovery room in stable condition.  Sponge, needle, and instrument counts were correct at the end of the case x2.  The  patient tolerated the procedure well and there were no known complications.  Please note that a surgical assistant was a medical necessity for this procedure to perform it in a safe and expeditious manner. Assistant was necessary to provide appropriate retraction of vital neurovascular structures, to prevent femoral fracture, and to allow for anatomic placement of the prosthesis.

## 2020-11-25 NOTE — Anesthesia Procedure Notes (Signed)
Spinal  Patient location during procedure: OR Start time: 11/25/2020 7:52 AM End time: 11/25/2020 7:57 AM Reason for block: surgical anesthesia Staffing Performed: anesthesiologist  Anesthesiologist: Suzette Battiest, MD Preanesthetic Checklist Completed: patient identified, IV checked, site marked, risks and benefits discussed, surgical consent, monitors and equipment checked, pre-op evaluation and timeout performed Spinal Block Patient position: sitting Prep: DuraPrep Patient monitoring: heart rate, cardiac monitor, continuous pulse ox and blood pressure Approach: midline Location: L4-5 Injection technique: single-shot Needle Needle type: Pencan  Needle gauge: 24 G Needle length: 9 cm Assessment Sensory level: T4 Events: CSF return

## 2020-11-25 NOTE — Transfer of Care (Signed)
Immediate Anesthesia Transfer of Care Note  Patient: Diana Alvarado  Procedure(s) Performed: Procedure(s): TOTAL HIP ARTHROPLASTY ANTERIOR APPROACH (Right)  Patient Location: PACU  Anesthesia Type:Spinal  Level of Consciousness:  sedated, patient cooperative and responds to stimulation  Airway & Oxygen Therapy:Patient Spontanous Breathing and Patient connected to face mask oxgen  Post-op Assessment:  Report given to PACU RN and Post -op Vital signs reviewed and stable  Post vital signs:  Reviewed and stable  Last Vitals:  Vitals:   11/25/20 0536  BP: 113/79  Pulse: 80  Resp: 16  Temp: 36.8 C  SpO2: 967%    Complications: No apparent anesthesia complications

## 2020-11-25 NOTE — Progress Notes (Signed)
Pt arrived with multiple rings on fingers. This nurse and several others got some rings off with lubrication. Two rings had to be cut off because they wouldn't come over the knuckle. All rings placed in pink container and placed in bag

## 2020-11-25 NOTE — Evaluation (Signed)
Physical Therapy Evaluation Patient Details Name: Diana Alvarado MRN: [id number] DOB: 1956/02/13 Today's Date: 11/25/2020   History of Present Illness  Pt s/p R THR and with hx of DJD, DDD, ADHD, cervical fusion, and chronic LBP  Clinical Impression  Pt s/p R THR and presents with functional mobility limitations 2* decreased R LE strength/ROM and post op pain.  Pt should progress to dc home with family assist.    Follow Up Recommendations Follow surgeon's recommendation for DC plan and follow-up therapies    Equipment Recommendations  Rolling walker with 5" wheels;3in1 (PT) (Youth level RW)    Recommendations for Other Services       Precautions / Restrictions Precautions Precautions: Fall Restrictions Weight Bearing Restrictions: No Other Position/Activity Restrictions: WBAT      Mobility  Bed Mobility Overal bed mobility: Needs Assistance Bed Mobility: Supine to Sit     Supine to sit: Min assist;Mod assist     General bed mobility comments: cues for sequence and use of L LE to self assist    Transfers Overall transfer level: Needs assistance Equipment used: Rolling walker (2 wheeled) Transfers: Sit to/from Stand Sit to Stand: Min assist         General transfer comment: cues for LE management and use of UEs to self  Ambulation/Gait Ambulation/Gait assistance: Min assist Gait Distance (Feet): 68 Feet Assistive device: Rolling walker (2 wheeled) Gait Pattern/deviations: Step-to pattern;Step-through pattern;Decreased step length - right;Decreased step length - left;Shuffle;Trunk flexed Gait velocity: decr   General Gait Details: cues for sequence, posture and position from [employer]            Wheelchair Mobility    Modified Rankin (Stroke Patients Only)       Balance Overall balance assessment: Needs assistance Sitting-balance support: No upper extremity supported;Feet supported Sitting balance-Leahy Scale: Fair     Standing balance support:  Bilateral upper extremity supported Standing balance-Leahy Scale: Poor                               Pertinent Vitals/Pain Pain Assessment: 0-10 Pain Score: 7  Pain Location: R hip/thigh Pain Descriptors / Indicators: Aching;Burning;Sore Pain Intervention(s): Limited activity within patient's tolerance;Monitored during session;Premedicated before session;Ice applied    Home Living Family/patient expects to be discharged to:: Private residence Living Arrangements: Spouse/significant other Available Help at Discharge: Family Type of Home: House Home Access: Stairs to enter Entrance Stairs-Rails: Right Entrance Stairs-Number of Steps: 4 Home Layout: One level Home Equipment: None      Prior Function Level of Independence: Independent               Hand Dominance        Extremity/Trunk Assessment   Upper Extremity Assessment Upper Extremity Assessment: Overall WFL for tasks assessed    Lower Extremity Assessment Lower Extremity Assessment: RLE deficits/detail    Cervical / Trunk Assessment Cervical / Trunk Assessment: Normal  Communication   Communication: No difficulties  Cognition Arousal/Alertness: Awake/alert Behavior During Therapy: WFL for tasks assessed/performed Overall Cognitive Status: Within Functional Limits for tasks assessed                                        General Comments      Exercises Total Joint Exercises Ankle Circles/Pumps: AROM;Both;15 reps;Supine   Assessment/Plan    PT Assessment Patient needs  continued PT services  PT Problem List Decreased strength;Decreased range of motion;Decreased activity tolerance;Decreased balance;Decreased mobility;Decreased knowledge of use of DME;Pain       PT Treatment Interventions DME instruction;Gait training;Stair training;Functional mobility training;Therapeutic activities;Therapeutic exercise;Balance training;Patient/family education    PT Goals (Current  goals can be found in the Care Plan section)  Acute Rehab PT Goals Patient Stated Goal: Regain IND PT Goal Formulation: With patient Time For Goal Achievement: 12/02/20 Potential to Achieve Goals: Good    Frequency 7X/week   Barriers to discharge        Co-evaluation               AM-PAC PT "6 Clicks" Mobility  Outcome Measure Help needed turning from your back to your side while in a flat bed without using bedrails?: A Little Help needed moving from lying on your back to sitting on the side of a flat bed without using bedrails?: A Little Help needed moving to and from a bed to a chair (including a wheelchair)?: A Little Help needed standing up from a chair using your arms (e.g., wheelchair or bedside chair)?: A Little Help needed to walk in hospital room?: A Little Help needed climbing 3-5 steps with a railing? : A Lot 6 Click Score: 17    End of Session Equipment Utilized During Treatment: Gait belt Activity Tolerance: Patient tolerated treatment well Patient left: in chair;with call bell/phone within reach;with chair alarm set Nurse Communication: Mobility status PT Visit Diagnosis: Difficulty in walking, not elsewhere classified (R26.2)    Time: 1532-1600 PT Time Calculation (min) (ACUTE ONLY): 28 min   Charges:   PT Evaluation $PT Eval Low Complexity: 1 Low PT Treatments $Gait Training: 8-22 mins $Therapeutic Exercise: 8-22 mins        Debe Coder PT Acute Rehabilitation Services Pager [phone] Office [phone]   Diana Alvarado 11/25/2020, 5:06 PM

## 2020-11-25 NOTE — Discharge Instructions (Addendum)
° °Dr. Brian Swinteck °Joint Replacement Specialist °[facility] °[address] °[place], [place] [address] °[phone] ° ° °TOTAL HIP REPLACEMENT POSTOPERATIVE DIRECTIONS ° ° ° °Hip Rehabilitation, Guidelines Following Surgery  ° °WEIGHT BEARING °Weight bearing as tolerated with assist device (walker, cane, etc) as directed, use it as long as suggested by your surgeon or therapist, typically at least 4-6 weeks. ° °The results of a hip operation are greatly improved after range of motion and muscle strengthening exercises. Follow all safety measures which are given to protect your hip. If any of these exercises cause increased pain or swelling in your joint, decrease the amount until you are comfortable again. Then slowly increase the exercises. Call your caregiver if you have problems or questions.  ° °HOME CARE INSTRUCTIONS  °Most of the following instructions are designed to prevent the dislocation of your new hip.  °Remove items at home which could result in a fall. This includes throw rugs or furniture in walking pathways.  °Continue medications as instructed at time of discharge. °· You may have some home medications which will be placed on hold until you complete the course of blood thinner medication. °· You may start showering once you are discharged home. Do not remove your dressing. °Do not put on socks or shoes without following the instructions of your caregivers.   °Sit on chairs with arms. Use the chair arms to help push yourself up when arising.  °Arrange for the use of a toilet seat elevator so you are not sitting low.  °· Walk with walker as instructed.  °You may resume a sexual relationship in one month or when given the OK by your caregiver.  °Use walker as long as suggested by your caregivers.  °You may put full weight on your legs and walk as much as is comfortable. °Avoid periods of inactivity such as sitting longer than an hour when not asleep. This helps prevent  blood clots.  °You may return to work once you are cleared by your surgeon.  °Do not drive a car for 6 weeks or until released by your surgeon.  °Do not drive while taking narcotics.  °Wear elastic stockings for two weeks following surgery during the day but you may remove then at night.  °Make sure you keep all of your appointments after your operation with all of your doctors and caregivers. You should call the office at the above phone number and make an appointment for approximately two weeks after the date of your surgery. °Please pick up a stool softener and laxative for home use as long as you are requiring pain medications. °· ICE to the affected hip every three hours for 30 minutes at a time and then as needed for pain and swelling. Continue to use ice on the hip for pain and swelling from surgery. You may notice swelling that will progress down to the foot and ankle.  This is normal after surgery.  Elevate the leg when you are not up walking on it.   °It is important for you to complete the blood thinner medication as prescribed by your doctor. °· Continue to use the breathing machine which will help keep your temperature down.  It is common for your temperature to cycle up and down following surgery, especially at night when you are not up moving around and exerting yourself.  The breathing machine keeps your lungs expanded and your temperature down. ° °RANGE OF MOTION AND STRENGTHENING EXERCISES  °These exercises   are designed to help you keep full movement of your hip joint. Follow your caregiver's or physical therapist's instructions. Perform all exercises about fifteen times, three times per day or as directed. Exercise both hips, even if you have had only one joint replacement. These exercises can be done on a training (exercise) mat, on the floor, on a table or on a bed. Use whatever works the best and is most comfortable for you. Use music or television while you are exercising so that the exercises  are a pleasant break in your day. This will make your life better with the exercises acting as a break in routine you can look forward to.  °Lying on your back, slowly slide your foot toward your buttocks, raising your knee up off the floor. Then slowly slide your foot back down until your leg is straight again.  °Lying on your back spread your legs as far apart as you can without causing discomfort.  °Lying on your side, raise your upper leg and foot straight up from the floor as far as is comfortable. Slowly lower the leg and repeat.  °Lying on your back, tighten up the muscle in the front of your thigh (quadriceps muscles). You can do this by keeping your leg straight and trying to raise your heel off the floor. This helps strengthen the largest muscle supporting your knee.  °Lying on your back, tighten up the muscles of your buttocks both with the legs straight and with the knee bent at a comfortable angle while keeping your heel on the floor.  ° °SKILLED REHAB INSTRUCTIONS: °If the patient is transferred to a skilled rehab facility following release from the hospital, a list of the current medications will be sent to the facility for the patient to continue.  When discharged from the skilled rehab facility, please have the facility set up the patient's Home Health Physical Therapy prior to being released. Also, the skilled facility will be responsible for providing the patient with their medications at time of release from the facility to include their pain medication and their blood thinner medication. If the patient is still at the rehab facility at time of the two week follow up appointment, the skilled rehab facility will also need to assist the patient in arranging follow up appointment in our office and any transportation needs. ° °MAKE SURE YOU:  °Understand these instructions.  °Will watch your condition.  °Will get help right away if you are not doing well or get worse. ° °Pick up stool softner and  laxative for home use following surgery while on pain medications. °Do not remove your dressing. °The dressing is waterproof--it is OK to take showers. °Continue to use ice for pain and swelling after surgery. °Do not use any lotions or creams on the incision until instructed by your surgeon. °Total Hip Protocol. ° ° °Information on my medicine - ELIQUIS® (apixaban) ° °Why was Eliquis® prescribed for you? °Eliquis® was prescribed for you to reduce the risk of blood clots forming after orthopedic surgery.   ° °What do You need to know about Eliquis®? °Take your Eliquis® TWICE DAILY - one tablet in the morning and one tablet in the evening with or without food.  It would be best to take the dose about the same time each day. ° °If you have difficulty swallowing the tablet whole please discuss with your pharmacist how to take the medication safely. ° °Take Eliquis® exactly as prescribed by your doctor and DO NOT stop   taking Eliquis® without talking to the doctor who prescribed the medication.  Stopping without other medication to take the place of Eliquis® may increase your risk of developing a clot. ° °After discharge, you should have regular check-up appointments with your healthcare provider that is prescribing your Eliquis®. ° °What do you do if you miss a dose? °If a dose of ELIQUIS® is not taken at the scheduled time, take it as soon as possible on the same day and twice-daily administration should be resumed.  The dose should not be doubled to make up for a missed dose.  Do not take more than one tablet of ELIQUIS at the same time. ° °Important Safety Information °A possible side effect of Eliquis® is bleeding. You should call your healthcare provider right away if you experience any of the following: °? Bleeding from an injury or your nose that does not stop. °? Unusual colored urine (red or dark brown) or unusual colored stools (red or black). °? Unusual bruising for unknown reasons. °? A serious fall or if  you hit your head (even if there is no bleeding). ° °Some medicines may interact with Eliquis® and might increase your risk of bleeding or clotting while on Eliquis®. To help avoid this, consult your healthcare provider or pharmacist prior to using any new prescription or non-prescription medications, including herbals, vitamins, non-steroidal anti-inflammatory drugs (NSAIDs) and supplements. ° °This website has more information on Eliquis® (apixaban): http://www.eliquis.com/eliquis/home ° °

## 2020-11-25 NOTE — Anesthesia Postprocedure Evaluation (Signed)
Anesthesia Post Note  Patient: Diana Alvarado  Procedure(s) Performed: TOTAL HIP ARTHROPLASTY ANTERIOR APPROACH (Right Hip)     Patient location during evaluation: PACU Anesthesia Type: Spinal Level of consciousness: awake and alert Pain management: pain level controlled Vital Signs Assessment: post-procedure vital signs reviewed and stable Respiratory status: spontaneous breathing and respiratory function stable Cardiovascular status: blood pressure returned to baseline and stable Postop Assessment: spinal receding Anesthetic complications: no   No complications documented.  Last Vitals:  Vitals:   11/25/20 1115 11/25/20 1130  BP: 113/68 105/64  Pulse: 87 77  Resp: 12 15  Temp:    SpO2: 99% 100%    Last Pain:  Vitals:   11/25/20 1130  TempSrc:   PainSc: 0-No pain                 Tiajuana Amass

## 2020-11-26 ENCOUNTER — Encounter (HOSPITAL_COMMUNITY): Payer: Self-pay | Admitting: Orthopedic Surgery

## 2020-11-26 DIAGNOSIS — M1611 Unilateral primary osteoarthritis, right hip: Secondary | ICD-10-CM | POA: Diagnosis not present

## 2020-11-26 LAB — CBC
HCT: 23.1 % — ABNORMAL LOW (ref 36.0–46.0)
Hemoglobin: 7.5 g/dL — ABNORMAL LOW (ref 12.0–15.0)
MCH: 31 pg (ref 26.0–34.0)
MCHC: 32.5 g/dL (ref 30.0–36.0)
MCV: 95.5 fL (ref 80.0–100.0)
Platelets: 153 10*3/uL (ref 150–400)
RBC: 2.42 MIL/uL — ABNORMAL LOW (ref 3.87–5.11)
RDW: 12.8 % (ref 11.5–15.5)
WBC: 11.7 10*3/uL — ABNORMAL HIGH (ref 4.0–10.5)
nRBC: 0 % (ref 0.0–0.2)

## 2020-11-26 LAB — BASIC METABOLIC PANEL
Anion gap: 5 (ref 5–15)
BUN: 21 mg/dL (ref 8–23)
CO2: 26 mmol/L (ref 22–32)
Calcium: 8 mg/dL — ABNORMAL LOW (ref 8.9–10.3)
Chloride: 111 mmol/L (ref 98–111)
Creatinine, Ser: 0.91 mg/dL (ref 0.44–1.00)
GFR, Estimated: >60 mL/min (ref >60)
Glucose, Bld: 151 mg/dL — ABNORMAL HIGH (ref 70–99)
Potassium: 4.3 mmol/L (ref 3.5–5.1)
Sodium: 142 mmol/L (ref 135–145)

## 2020-11-26 MED ORDER — ONDANSETRON HCL 4 MG PO TABS: 4.0000 mg | ORAL_TABLET | Freq: Four times a day (QID) | ORAL | 0 refills | Status: AC | PRN

## 2020-11-26 MED ORDER — OXYCODONE HCL 5 MG PO TABS: 10.0000 mg | ORAL_TABLET | ORAL | 0 refills | Status: AC | PRN

## 2020-11-26 MED ORDER — SENNA 8.6 MG PO TABS: 1.0000 | ORAL_TABLET | Freq: Two times a day (BID) | ORAL | 0 refills | Status: AC

## 2020-11-26 MED ORDER — APIXABAN 2.5 MG PO TABS
2.5000 mg | ORAL_TABLET | Freq: Two times a day (BID) | ORAL | 0 refills | Status: DC
Start: 2020-11-26 — End: 2020-12-31

## 2020-11-26 MED ORDER — DOCUSATE SODIUM 100 MG PO CAPS: 100.0000 mg | ORAL_CAPSULE | Freq: Two times a day (BID) | ORAL | 0 refills | Status: AC

## 2020-11-26 NOTE — Progress Notes (Signed)
    Subjective:  Patient reports pain as mild to moderate.  Denies N/V/CP/SOB.   Objective:   VITALS:   Vitals:   11/25/20 2139 11/26/20 0200 11/26/20 0608 11/26/20 0957  BP: 105/73 107/65 112/70 111/67  Pulse: 95 98 99 89  Resp: 18 15 15 15   Temp: 98.2 F (36.8 C) 98.3 F (36.8 C) 97.8 F (36.6 C) 98.4 F (36.9 C)  TempSrc: Oral Oral Oral Oral  SpO2: 97% 95% 97% 100%  Weight:      Height:        NAD ABD soft Neurovascular intact Sensation intact distally Intact pulses distally Dorsiflexion/Plantar flexion intact Incision: dressing C/D/I   Lab Results  Component Value Date   WBC 11.7 (H) 11/26/2020   HGB 7.5 (L) 11/26/2020   HCT 23.1 (L) 11/26/2020   MCV 95.5 11/26/2020   PLT 153 11/26/2020   BMET    Component Value Date/Time   NA 142 11/26/2020 0311   NA 141 03/26/2018 0933   K 4.3 11/26/2020 0311   CL 111 11/26/2020 0311   CO2 26 11/26/2020 0311   GLUCOSE 151 (H) 11/26/2020 0311   BUN 21 11/26/2020 0311   BUN 14 03/26/2018 0933   CREATININE 0.91 11/26/2020 0311   CALCIUM 8.0 (L) 11/26/2020 0311   GFRNONAA >60 11/26/2020 0311   GFRAA 67 03/26/2018 0933     Assessment/Plan: 1 Day Post-Op   Principal Problem:   Osteoarthritis of right hip   WBAT with walker DVT ppx: Apixaban, SCDs, TEDS ABLA: Asymptomatic, HgB 7.5 this AM PO pain control PT/OT Dispo: D/C home     Dorothyann Peng 11/26/2020, 10:29 AM [facility] is now [address], [place], [place] [address] Phone: [phone] [email or web address]  [employer]

## 2020-11-26 NOTE — Discharge Summary (Addendum)
Physician Discharge Summary  Patient ID: Diana Alvarado MRN: [id number] DOB/AGE: 09/18/1955 65 y.o.  Admit date: 11/25/2020 Discharge date: 11/26/2020  Admission Diagnoses:  Osteoarthritis of right hip  Discharge Diagnoses:  Principal Problem:   Osteoarthritis of right hip   Past Medical History:  Diagnosis Date  . ADHD   . Anxiety 11/17/2011  . Asthma 04/19/2011  . Chronic kidney disease    Stage III  . Chronic LBP 04/19/2011  . Depression 04/19/2011  . DJD (degenerative joint disease), cervical 04/19/2011  . Emphysema, unspecified ([place])   . Lumbar disc disease 04/19/2011  . Pulmonary nodule   . Splenic artery aneurysm ([place])   . UTI (lower urinary tract infection)     Surgeries: Procedure(s): TOTAL HIP ARTHROPLASTY ANTERIOR APPROACH on 11/25/2020   Consultants (if any):   Discharged Condition: Improved  Hospital Course: Diana Alvarado is an 65 y.o. female who was admitted 11/25/2020 with a diagnosis of Osteoarthritis of right hip and went to the operating room on 11/25/2020 and underwent the above named procedures.    She was given perioperative antibiotics:  Anti-infectives (From admission, onward)   Start     Dose/Rate Route Frequency Ordered Stop   11/25/20 1400  ceFAZolin (ANCEF) IVPB 2g/100 mL premix        2 g 200 mL/hr over 30 Minutes Intravenous Every 6 hours 11/25/20 1030 11/25/20 2120   11/25/20 0600  ceFAZolin (ANCEF) IVPB 2g/100 mL premix        2 g 200 mL/hr over 30 Minutes Intravenous On call to O.R. 11/25/20 0630 11/25/20 0753    .  She was given sequential compression devices, early ambulation, and Apixaban for DVT prophylaxis.  She benefited maximally from the hospital stay and there were no complications.    Recent vital signs:  Vitals:   11/26/20 0608 11/26/20 0957  BP: 112/70 111/67  Pulse: 99 89  Resp: 15 15  Temp: 97.8 F (36.6 C) 98.4 F (36.9 C)  SpO2: 97% 100%    Recent laboratory studies:  Lab Results  Component Value Date   HGB 7.5 (L)  11/26/2020   HGB 13.4 11/17/2020   HGB 13.4 04/19/2012   Lab Results  Component Value Date   WBC 11.7 (H) 11/26/2020   PLT 153 11/26/2020   Lab Results  Component Value Date   INR 0.9 11/17/2020   Lab Results  Component Value Date   NA 142 11/26/2020   K 4.3 11/26/2020   CL 111 11/26/2020   CO2 26 11/26/2020   BUN 21 11/26/2020   CREATININE 0.91 11/26/2020   GLUCOSE 151 (H) 11/26/2020    Discharge Medications:   Allergies as of 11/26/2020      Reactions   Aspirin Hives   Sulfonamide Derivatives Hives      Medication List    TAKE these medications   acetaminophen 500 MG tablet Commonly known as: TYLENOL Take 500 mg by mouth every 6 (six) hours as needed for mild pain or moderate pain.   albuterol 108 (90 Base) MCG/ACT inhaler Commonly known as: VENTOLIN HFA Inhale 2 puffs into the lungs every 6 (six) hours as needed for wheezing or shortness of breath.   ALPRAZolam 0.5 MG tablet Commonly known as: XANAX TAKE 1/2 TO 1 TABLET BY MOUTH TWICE DAILY AS NEEDED FOR ANXIETY. MUST LAST 30 DAYS.   amphetamine-dextroamphetamine 30 MG tablet Commonly known as: Adderall Take 1 tablet by mouth 2 (two) times daily.   amphetamine-dextroamphetamine 30 MG tablet Commonly known as: ADDERALL  Take 1 tablet by mouth 2 (two) times daily. Start taking on: December 15, 2020   amphetamine-dextroamphetamine 30 MG tablet Commonly known as: Adderall Take 1 tablet by mouth 2 (two) times daily. Start taking on: Jan 13, 2021   apixaban 2.5 MG Tabs tablet Commonly known as: ELIQUIS Take 1 tablet (2.5 mg total) by mouth every 12 (twelve) hours.   ARIPiprazole 10 MG tablet Commonly known as: ABILIFY TAKE 1 TABLET(10 MG) BY MOUTH DAILY What changed: See the new instructions.   Breo Ellipta 100-25 MCG/INH Aepb Generic drug: fluticasone furoate-vilanterol Inhale 1 puff into the lungs daily at 12 noon.   docusate sodium 100 MG capsule Commonly known as: COLACE Take 1 capsule (100 mg  total) by mouth 2 (two) times daily.   DULoxetine 60 MG capsule Commonly known as: CYMBALTA TAKE 1 CAPSULE(60 MG) BY MOUTH DAILY   hydrOXYzine 10 MG tablet Commonly known as: ATARAX/VISTARIL TAKE 1 TO 3 TABLETS(10 TO 30 MG) BY MOUTH THREE TIMES DAILY AS NEEDED FOR ANXIETY What changed: See the new instructions.   Narcan 4 MG/0.1ML Liqd nasal spray kit Generic drug: naloxone Place 4 mg into the nose once.   ondansetron 4 MG tablet Commonly known as: ZOFRAN Take 1 tablet (4 mg total) by mouth every 6 (six) hours as needed for nausea.   oxyCODONE 5 MG immediate release tablet Commonly known as: Oxy IR/ROXICODONE Take 2 tablets (10 mg total) by mouth every 4 (four) hours as needed for up to 7 days for moderate pain (pain score 4-6).   oxyCODONE-acetaminophen 10-325 MG tablet Commonly known as: PERCOCET Take 1 tablet by mouth 5 (five) times daily.   senna 8.6 MG Tabs tablet Commonly known as: SENOKOT Take 1 tablet (8.6 mg total) by mouth 2 (two) times daily.   varenicline 1 MG tablet Commonly known as: CHANTIX Take 1 tablet (1 mg total) by mouth 2 (two) times daily. Start after completing the 2 weeks of 0.5 mg. What changed: Another medication with the same name was changed. Make sure you understand how and when to take each.   varenicline 0.5 MG tablet Commonly known as: CHANTIX 1 p.o. daily for 2 days, then 1 p.o. twice daily until gone.  Then she will take the 1 mg prescription What changed:   how much to take  how to take this  when to take this       Diagnostic Studies: DG Pelvis Portable  Result Date: 11/25/2020 CLINICAL DATA:  Right hip arthroplasty EXAM: PORTABLE PELVIS 1-2 VIEWS COMPARISON:  None. FINDINGS: Single view radiograph of the pelvis demonstrates surgical changes of right total hip arthroplasty. Arthroplasty components overlie the expected position. No unexpected fracture or dislocation. Limited evaluation of the left hip is unremarkable. Gas is seen  within the soft tissues surrounding the right hip. IMPRESSION: Status post right total hip arthroplasty. No unexpected fracture or dislocation. Electronically Signed   By: Fidela Salisbury MD   On: 11/25/2020 11:21   DG C-Arm 1-60 Min-No Report  Result Date: 11/25/2020 Fluoroscopy was utilized by the requesting physician.  No radiographic interpretation.   DG HIP OPERATIVE UNILAT W OR W/O PELVIS RIGHT  Result Date: 11/25/2020 CLINICAL DATA:  Right hip arthroplasty, intraoperative examination EXAM: OPERATIVE RIGHT HIP (WITH PELVIS IF PERFORMED) SINGLE VIEWS TECHNIQUE: Fluoroscopic spot image(s) were submitted for interpretation post-operatively. COMPARISON:  None. FINDINGS: Two fluoroscopic intraoperative radiographs are presented for interpretation postprocedurally. These images demonstrate surgical changes of right total hip arthroplasty with arthroplasty components overlying the  expected position. No unexpected fracture or dislocation on this limited examination. Defect within the lateral soft tissues is in keeping with soft tissue retraction. FLUOROSCOPY TIME:  Time: 0.3 minutes Images: 2 Dose: 2.2142 mGy IMPRESSION: Status post right total hip arthroplasty. Electronically Signed   By: Fidela Salisbury MD   On: 11/25/2020 11:23    Disposition: Discharge disposition: 01-Home or Self Care      Dental Antibiotics:  In most cases prophylactic antibiotics for Dental procdeures after total joint surgery are not necessary.  Exceptions are as follows:  1. History of prior total joint infection  2. Severely immunocompromised (Organ Transplant, cancer chemotherapy, Rheumatoid biologic meds such as [place])  3. Poorly controlled diabetes (A1C &gt; 8.0, blood glucose over 200)  If you have one of these conditions, contact your surgeon for an antibiotic prescription, prior to your dental procedure. Discharge Instructions    Call MD / Call 911   Complete by: As directed    If you experience chest  pain or shortness of breath, CALL 911 and be transported to the hospital emergency room.  If you develope a fever above 101 F, pus (white drainage) or increased drainage or redness at the wound, or calf pain, call your surgeon's office.   Constipation Prevention   Complete by: As directed    Drink plenty of fluids.  Prune juice may be helpful.  You may use a stool softener, such as Colace (over the counter) 100 mg twice a day.  Use MiraLax (over the counter) for constipation as needed.   Diet - low sodium heart healthy   Complete by: As directed    Driving restrictions   Complete by: As directed    No driving for 6 weeks   Increase activity slowly as tolerated   Complete by: As directed    Lifting restrictions   Complete by: As directed    No lifting for 6 weeks   TED hose   Complete by: As directed    Use stockings (TED hose) for 2 weeks on both leg(s).  You may remove them at night for sleeping.       Follow-up Information    Rashaan Wyles, Aaron Edelman, MD. Schedule an appointment as soon as possible for a visit in 2 weeks.   Specialty: Orthopedic Surgery Why: For wound re-check Contact information: [address] [place] [address] [phone]                Signed: Dorothyann Peng 11/26/2020, 10:27 AM

## 2020-11-26 NOTE — TOC Transition Note (Signed)
Transition of Care [facility]) - CM/SW Discharge Note  Patient Details  Name: Ronelle Michie MRN: [id number] Date of Birth: June 15, 1956  Transition of Care [facility]) CM/SW Contact:  Sherie Don, LCSW Phone Number: 11/26/2020, 9:59 AM  Clinical Narrative: Patient to discharge home today. CSW met with patient to confirm discharge plan. Per patient, she will discharge home with a home exercise program (HEP) and does not have a walker or 3N1 at home. Patient agreeable to [employer] providing DME. Referral made to [facility] with [employer]. TOC signing off.  Final next level of care: OP Rehab Barriers to Discharge: No Barriers Identified  Patient Goals and CMS Choice Patient states their goals for this hospitalization and ongoing recovery are:: Discharge home CMS Medicare.gov Compare Post Acute Care list provided to:: Patient Choice offered to / list presented to : Patient  Discharge Plan and Services         DME Arranged: 3-N-1,[facility] DME Agency: [employer] Date DME Agency Contacted: 11/26/20 Representative spoke with at DME Agency: Ovid Curd  Readmission Risk Interventions No flowsheet data found.

## 2020-11-26 NOTE — Progress Notes (Signed)
Physical Therapy Treatment Patient Details Name: Diana Alvarado MRN: [id number] DOB: 08-Oct-1955 Today's Date: 11/26/2020    History of Present Illness Pt s/p R THR and with hx of DJD, DDD, ADHD, cervical fusion, and chronic LBP    PT Comments    Pt very motivated and progressing well with mobility.  HEP initiated and pt up to ambulate increased distance in hall.  Will follow up with family ed once spouse arrives.   Follow Up Recommendations  Follow surgeon's recommendation for DC plan and follow-up therapies     Equipment Recommendations  Rolling walker with 5" wheels;3in1 (PT)    Recommendations for Other Services OT consult     Precautions / Restrictions Precautions Precautions: Fall Restrictions Weight Bearing Restrictions: No Other Position/Activity Restrictions: WBAT    Mobility  Bed Mobility Overal bed mobility: Needs Assistance Bed Mobility: Supine to Sit     Supine to sit: Min guard     General bed mobility comments: cues for sequence and use of L LE to self assist    Transfers Overall transfer level: Needs assistance Equipment used: Rolling walker (2 wheeled) Transfers: Sit to/from Stand Sit to Stand: Min guard         General transfer comment: cues for LE management and use of UEs to self  Ambulation/Gait Ambulation/Gait assistance: Min assist;Min guard Gait Distance (Feet): 150 Feet Assistive device: Rolling walker (2 wheeled) Gait Pattern/deviations: Step-to pattern;Step-through pattern;Decreased step length - right;Decreased step length - left;Shuffle;Trunk flexed Gait velocity: decr   General Gait Details: cues for sequence, posture and position from [employer]             Wheelchair Mobility    Modified Rankin (Stroke Patients Only)       Balance Overall balance assessment: Needs assistance Sitting-balance support: No upper extremity supported;Feet supported Sitting balance-Leahy Scale: Good     Standing balance support:  Bilateral upper extremity supported Standing balance-Leahy Scale: Fair                              Cognition Arousal/Alertness: Awake/alert Behavior During Therapy: WFL for tasks assessed/performed Overall Cognitive Status: Within Functional Limits for tasks assessed                                        Exercises Total Joint Exercises Ankle Circles/Pumps: AROM;Both;15 reps;Supine Quad Sets: AROM;Both;10 reps;Supine Heel Slides: AAROM;Right;20 reps;Supine Hip ABduction/ADduction: AAROM;Right;15 reps;Supine Long Arc Quad: AAROM;Right;5 reps;Seated    General Comments        Pertinent Vitals/Pain Pain Assessment: 0-10 Pain Score: 6  Pain Location: R hip/thigh Pain Descriptors / Indicators: Aching;Burning;Sore Pain Intervention(s): Limited activity within patient's tolerance;Monitored during session;Premedicated before session;Ice applied    Home Living                      Prior Function            PT Goals (current goals can now be found in the care plan section) Acute Rehab PT Goals Patient Stated Goal: Regain IND PT Goal Formulation: With patient Time For Goal Achievement: 12/02/20 Potential to Achieve Goals: Good Progress towards PT goals: Progressing toward goals    Frequency    7X/week      PT Plan Current plan remains appropriate    Co-evaluation  AM-PAC PT "6 Clicks" Mobility   Outcome Measure  Help needed turning from your back to your side while in a flat bed without using bedrails?: A Little Help needed moving from lying on your back to sitting on the side of a flat bed without using bedrails?: A Little Help needed moving to and from a bed to a chair (including a wheelchair)?: A Little Help needed standing up from a chair using your arms (e.g., wheelchair or bedside chair)?: A Little Help needed to walk in hospital room?: A Little Help needed climbing 3-5 steps with a railing? : A Lot 6  Click Score: 17    End of Session Equipment Utilized During Treatment: Gait belt Activity Tolerance: Patient tolerated treatment well Patient left: in chair;with call bell/phone within reach;with chair alarm set Nurse Communication: Mobility status PT Visit Diagnosis: Difficulty in walking, not elsewhere classified (R26.2)     Time: 1224-8250 PT Time Calculation (min) (ACUTE ONLY): 31 min  Charges:  $Gait Training: 8-22 mins $Therapeutic Exercise: 8-22 mins                     [place] Pager [phone] Office [phone]    Diana Alvarado 11/26/2020, 1:19 PM

## 2020-11-26 NOTE — Plan of Care (Signed)
Pt was given pain and anxiety meds before bedtime and has slept through the night. VS are stable and pt has expressed understanding of care.

## 2020-11-26 NOTE — Progress Notes (Signed)
Physical Therapy Treatment Patient Details Name: Veleta Yamamoto MRN: [id number] DOB: 10/27/1955 Today's Date: 11/26/2020    History of Present Illness Pt s/p R THR and with hx of DJD, DDD, ADHD, cervical fusion, and chronic LBP    PT Comments    Pt continues very motivated and progressing well with mobility.  Pt's spouse present to review car transfers, stairs and HEP - written instruction provided and reviewed.  Pt and spouse eager for return home.   Follow Up Recommendations  Follow surgeon's recommendation for DC plan and follow-up therapies     Equipment Recommendations  Rolling walker with 5" wheels;3in1 (PT)    Recommendations for Other Services OT consult     Precautions / Restrictions Precautions Precautions: Fall Restrictions Weight Bearing Restrictions: No Other Position/Activity Restrictions: WBAT    Mobility  Bed Mobility Overal bed mobility: Needs Assistance Bed Mobility: Supine to Sit;Sit to Supine     Supine to sit: Supervision Sit to supine: Min assist   General bed mobility comments: cues for sequence and use of L LE to self assist    Transfers Overall transfer level: Needs assistance Equipment used: Rolling walker (2 wheeled) Transfers: Sit to/from Stand Sit to Stand: Min guard         General transfer comment: cues for LE management and use of UEs to self  Ambulation/Gait Ambulation/Gait assistance: Min guard;Supervision Gait Distance (Feet): 100 Feet Assistive device: Rolling walker (2 wheeled) Gait Pattern/deviations: Step-to pattern;Step-through pattern;Decreased step length - right;Decreased step length - left;Shuffle;Trunk flexed Gait velocity: decr   General Gait Details: min cues for sequence, posture and position from RW   Stairs Stairs: Yes Stairs assistance: Min assist Stair Management: One rail Left;Forwards;With cane;Step to pattern Number of Stairs: 5 General stair comments: cues for sequence and foot/cane  placement   Wheelchair Mobility    Modified Rankin (Stroke Patients Only)       Balance Overall balance assessment: Needs assistance Sitting-balance support: No upper extremity supported;Feet supported Sitting balance-Leahy Scale: Good     Standing balance support: Bilateral upper extremity supported Standing balance-Leahy Scale: Fair                              Cognition Arousal/Alertness: Awake/alert Behavior During Therapy: WFL for tasks assessed/performed Overall Cognitive Status: Within Functional Limits for tasks assessed                                        Exercises Total Joint Exercises Ankle Circles/Pumps: AROM;Both;15 reps;Supine Quad Sets: AROM;Both;10 reps;Supine Heel Slides: AAROM;Right;Supine;10 reps Hip ABduction/ADduction: AAROM;Right;Supine;10 reps Long Arc Quad: AAROM;Right;5 reps;Seated    General Comments        Pertinent Vitals/Pain Pain Assessment: 0-10 Pain Score: 6  Pain Location: R hip/thigh Pain Descriptors / Indicators: Aching;Burning;Sore Pain Intervention(s): Limited activity within patient's tolerance;Monitored during session;Premedicated before session    Home Living                      Prior Function            PT Goals (current goals can now be found in the care plan section) Acute Rehab PT Goals Patient Stated Goal: Regain IND PT Goal Formulation: With patient Time For Goal Achievement: 12/02/20 Potential to Achieve Goals: Good Progress towards PT goals: Progressing toward goals    Frequency  7X/week      PT Plan Current plan remains appropriate    Co-evaluation              AM-PAC PT "6 Clicks" Mobility   Outcome Measure  Help needed turning from your back to your side while in a flat bed without using bedrails?: A Little Help needed moving from lying on your back to sitting on the side of a flat bed without using bedrails?: A Little Help needed moving to  and from a bed to a chair (including a wheelchair)?: A Little Help needed standing up from a chair using your arms (e.g., wheelchair or bedside chair)?: A Little Help needed to walk in hospital room?: A Little Help needed climbing 3-5 steps with a railing? : A Little 6 Click Score: 18    End of Session Equipment Utilized During Treatment: Gait belt Activity Tolerance: Patient tolerated treatment well Patient left: in bed;with call bell/phone within reach;with family/visitor present Nurse Communication: Mobility status PT Visit Diagnosis: Difficulty in walking, not elsewhere classified (R26.2)     Time: 4621-9471 PT Time Calculation (min) (ACUTE ONLY): 35 min  Charges:  $Gait Training: 8-22 mins $Therapeutic Exercise: 8-22 mins                     [place] Pager [phone] Office [phone]    Wor[place]ton Cruzan 11/26/2020, 1:24 PM

## 2020-11-28 ENCOUNTER — Other Ambulatory Visit: Payer: Self-pay | Admitting: Physician Assistant

## 2020-11-30 NOTE — Telephone Encounter (Signed)
Refill

## 2020-12-01 ENCOUNTER — Other Ambulatory Visit: Payer: Self-pay | Admitting: Physician Assistant

## 2020-12-03 ENCOUNTER — Other Ambulatory Visit: Payer: Self-pay | Admitting: Physician Assistant

## 2020-12-03 NOTE — Telephone Encounter (Signed)
Controlled substance 

## 2020-12-04 ENCOUNTER — Encounter (HOSPITAL_COMMUNITY): Payer: Self-pay

## 2020-12-04 ENCOUNTER — Emergency Department (HOSPITAL_COMMUNITY)
Admission: EM | Admit: 2020-12-04 | Discharge: 2020-12-05 | Disposition: A | Discharge status: Home / Self Care | Payer: Medicare Other | Attending: Emergency Medicine | Admitting: Emergency Medicine

## 2020-12-04 ENCOUNTER — Other Ambulatory Visit: Payer: Self-pay

## 2020-12-04 DIAGNOSIS — Z7951 Long term (current) use of inhaled steroids: Secondary | ICD-10-CM | POA: Diagnosis not present

## 2020-12-04 DIAGNOSIS — M25551 Pain in right hip: Secondary | ICD-10-CM | POA: Insufficient documentation

## 2020-12-04 DIAGNOSIS — F17210 Nicotine dependence, cigarettes, uncomplicated: Secondary | ICD-10-CM | POA: Insufficient documentation

## 2020-12-04 DIAGNOSIS — R5383 Other fatigue: Secondary | ICD-10-CM | POA: Insufficient documentation

## 2020-12-04 DIAGNOSIS — N183 Chronic kidney disease, stage 3 unspecified: Secondary | ICD-10-CM | POA: Insufficient documentation

## 2020-12-04 DIAGNOSIS — J45909 Unspecified asthma, uncomplicated: Secondary | ICD-10-CM | POA: Insufficient documentation

## 2020-12-04 DIAGNOSIS — Z96641 Presence of right artificial hip joint: Secondary | ICD-10-CM | POA: Diagnosis not present

## 2020-12-04 LAB — CBC WITH DIFFERENTIAL/PLATELET
Abs Immature Granulocytes: 0.1 10*3/uL — ABNORMAL HIGH (ref 0.00–0.07)
Basophils Absolute: 0 10*3/uL (ref 0.0–0.1)
Basophils Relative: 0 %
Eosinophils Absolute: 0.3 10*3/uL (ref 0.0–0.5)
Eosinophils Relative: 4 %
HCT: 24.6 % — ABNORMAL LOW (ref 36.0–46.0)
Hemoglobin: 7.6 g/dL — ABNORMAL LOW (ref 12.0–15.0)
Immature Granulocytes: 1 %
Lymphocytes Relative: 28 %
Lymphs Abs: 2.3 10*3/uL (ref 0.7–4.0)
MCH: 30.9 pg (ref 26.0–34.0)
MCHC: 30.9 g/dL (ref 30.0–36.0)
MCV: 100 fL (ref 80.0–100.0)
Monocytes Absolute: 0.6 10*3/uL (ref 0.1–1.0)
Monocytes Relative: 7 %
Neutro Abs: 4.9 10*3/uL (ref 1.7–7.7)
Neutrophils Relative %: 60 %
Platelets: 360 10*3/uL (ref 150–400)
RBC: 2.46 MIL/uL — ABNORMAL LOW (ref 3.87–5.11)
RDW: 14.3 % (ref 11.5–15.5)
WBC: 8.2 10*3/uL (ref 4.0–10.5)
nRBC: 0.4 % — ABNORMAL HIGH (ref 0.0–0.2)

## 2020-12-04 LAB — COMPREHENSIVE METABOLIC PANEL
ALT: 26 U/L (ref 0–44)
AST: 21 U/L (ref 15–41)
Albumin: 3.5 g/dL (ref 3.5–5.0)
Alkaline Phosphatase: 112 U/L (ref 38–126)
Anion gap: 10 (ref 5–15)
BUN: 18 mg/dL (ref 8–23)
CO2: 24 mmol/L (ref 22–32)
Calcium: 8.7 mg/dL — ABNORMAL LOW (ref 8.9–10.3)
Chloride: 103 mmol/L (ref 98–111)
Creatinine, Ser: 0.98 mg/dL (ref 0.44–1.00)
GFR, Estimated: >60 mL/min (ref >60)
Glucose, Bld: 93 mg/dL (ref 70–99)
Potassium: 3.9 mmol/L (ref 3.5–5.1)
Sodium: 137 mmol/L (ref 135–145)
Total Bilirubin: 1.2 mg/dL (ref 0.3–1.2)
Total Protein: 6.4 g/dL — ABNORMAL LOW (ref 6.5–8.1)

## 2020-12-04 LAB — TROPONIN I (HIGH SENSITIVITY): Troponin I (High Sensitivity): 6 ng/L (ref <18)

## 2020-12-04 NOTE — ED Provider Notes (Signed)
MSE was initiated and I personally evaluated the patient and placed orders (if any) at  9:39 PM on December 04, 2020.  The patient appears stable so that the remainder of the MSE may be completed by another provider.   Marcello Fennel, PA-C 12/04/20 2140    Lucrezia Starch, MD 12/06/20 [phone]

## 2020-12-04 NOTE — ED Triage Notes (Signed)
Pt reports having right hip replacement on 3/24. Pt is here today due to hip pain and right lower leg swelling that began today. Pt also reports feeling fatigued and states that her gums are pale.

## 2020-12-05 LAB — TYPE AND SCREEN
ABO/RH(D): B NEG
Antibody Screen: NEGATIVE

## 2020-12-05 LAB — TROPONIN I (HIGH SENSITIVITY): Troponin I (High Sensitivity): 3 ng/L (ref <18)

## 2020-12-05 MED ORDER — KETOROLAC TROMETHAMINE 60 MG/2ML IM SOLN
15.0000 mg | Freq: Once | INTRAMUSCULAR | Status: AC
  Administered 2020-12-05: 15 mg via INTRAMUSCULAR
  Filled 2020-12-05: qty 2

## 2020-12-05 NOTE — Discharge Instructions (Signed)
Take tylenol 1000mg (2 extra strength) four times a day.   Then take the pain medicine if you feel like you need it. Narcotics do not help with the pain, they only make you care about it less.  You can become addicted to this, people may break into your house to steal it.  It will constipate you.  If you drive under the influence of this medicine you can get a DUI.    Please call your orthopedic doctor on Monday.  Please return to the ED for worsening pain or if you develop a fever.

## 2020-12-05 NOTE — ED Provider Notes (Signed)
Deputy DEPT Provider Note   CSN: [id number] Arrival date & time: 12/04/20  2056     History Chief Complaint  Patient presents with  . Hip Pain  . Fatigue    Diana Alvarado is a 65 y.o. female.  65 yo F with a chief complaints of right hip pain.  Patient is postop hip replacements.  Has been having some increased pain and swelling to the leg.  Is feeling mildly fatigued as well.  She was at a family get together this evening and the husband thought that her skin color was different than normal and a family member who is a nurse looked at her mouth and eyes and felt that she was severely anemic and she come to the ED for evaluation.  She denies dark stool or blood in her stool.  She denies trauma.  Has been able to do her physical therapy exercises at home without issue.  The history is provided by the patient.  Hip Pain This is a new problem. The current episode started yesterday. The problem occurs constantly. The problem has not changed since onset.Pertinent negatives include no chest pain, no headaches and no shortness of breath. The symptoms are aggravated by bending and twisting. Nothing relieves the symptoms. She has tried nothing for the symptoms.       Past Medical History:  Diagnosis Date  . ADHD   . Anxiety 11/17/2011  . Asthma 04/19/2011  . Chronic kidney disease    Stage III  . Chronic LBP 04/19/2011  . Depression 04/19/2011  . DJD (degenerative joint disease), cervical 04/19/2011  . Emphysema, unspecified ([place])   . Lumbar disc disease 04/19/2011  . Pulmonary nodule   . Splenic artery aneurysm ([place])   . UTI (lower urinary tract infection)     Patient Active Problem List   Diagnosis Date Noted  . Osteoarthritis of right hip 11/25/2020  . Chronic lumbar radicular pain (Left) 04/29/2018  . Cervicalgia (Bilateral) 04/29/2018  . Osteoarthritis of hands (Bilateral) 04/29/2018  . Cervical fusion syndrome 04/29/2018  . History of fusion of  cervical spine 04/29/2018  . 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 History of substance use disorder 04/29/2018  . Cervical facet arthropathy (Bilateral) 04/29/2018  . Cervical facet syndrome (Bilateral) 04/29/2018  . Sacroiliac joint dysfunction (Left) 04/29/2018  . Chronic sacroiliac joint pain (Left) 04/29/2018  . Lumbar facet syndrome (Bilateral) (L>R) 04/29/2018  . Lumbar facet arthropathy (Bilateral) 04/29/2018  . Spondylosis without myelopathy or radiculopathy, lumbosacral region 04/29/2018  . Other specified dorsopathies, sacral and sacrococcygeal region 04/29/2018  . DDD (degenerative disc disease), lumbar 04/29/2018  . Osteoarthritis of hips (Bilateral) 04/29/2018  . Chronic myofascial pain 04/29/2018  . Neurogenic pain 04/29/2018  . Chronic low back pain (Primary Area of Pain) (Bilateral) (L>R) w/ sciatica (Left) 03/26/2018  . Chronic lower extremity pain (Secondary Area of Pain) (Bilateral) (L>R) 03/26/2018  . Chronic neck pain [facility] Area of Pain) (Bilateral) 03/26/2018  . Chronic hand pain (Fourth Area of Pain) (Bilateral) 03/26/2018  . Chronic hip pain (Left) 03/26/2018  . Chronic pain syndrome 03/26/2018  . Long term current use of opiate analgesic 03/26/2018  . Pharmacologic therapy 03/26/2018  . Disorder of skeletal system 03/26/2018  . Problems influencing health status 03/26/2018  . Impingement syndrome of shoulder region (Right) 02/04/2018  . Splenic artery aneurysm ([place]) 09/12/2016  . Insomnia 12/08/2013  . Ganglion cyst 12/24/2012  . Protrusion of cervical intervertebral disc 12/24/2012  .  Cigarette nicotine dependence without complication 76/73/4193  . Personal history of drug dependence ([place]) 06/05/2012  . Cervical post-laminectomy syndrome 02/06/2012  . Anxiety state 11/17/2011  . Asthma 04/19/2011  . Preventative health care 04/19/2011  . DDD (degenerative disc disease), cervical 04/19/2011  . Major depression 03/04/2009   . Attention deficit disorder 03/04/2009  . Generalized anxiety disorder 03/04/2009    Past Surgical History:  Procedure Laterality Date  . ABDOMINAL HYSTERECTOMY  1997  . APPENDECTOMY    . CHOLECYSTECTOMY    . gall bladder  2005  . NECK SURGERY  1995  . ruptured disc  1992   repaired ruptured disc, neck  . TOTAL HIP ARTHROPLASTY Right 11/25/2020   Procedure: TOTAL HIP ARTHROPLASTY ANTERIOR APPROACH;  Surgeon: Rod Can, MD;  Location: [facility];  Service: Orthopedics;  Laterality: Right;     OB History   No obstetric history on file.     Family History  Problem Relation Age of Onset  . Arthritis Other   . Cancer Other        cancer  . Diabetes Other   . Colon cancer Maternal Aunt   . Esophageal cancer Neg Hx   . Rectal cancer Neg Hx   . Stomach cancer Neg Hx     Social History   Tobacco Use  . Smoking status: Current Every Day Smoker    Packs/day: 0.10    Years: 10.00    Pack years: 1.00    Types: Cigarettes  . Smokeless tobacco: Never Used  Vaping Use  . Vaping Use: Never used  Substance Use Topics  . Alcohol use: No  . Drug use: No    Home Medications Prior to Admission medications   Medication Sig Start Date End Date Taking? Authorizing Provider  acetaminophen (TYLENOL) 500 MG tablet Take 500 mg by mouth every 6 (six) hours as needed for mild pain or moderate pain.    [provider]  albuterol (PROVENTIL HFA;VENTOLIN HFA) 108 (90 BASE) MCG/ACT inhaler Inhale 2 puffs into the lungs every 6 (six) hours as needed for wheezing or shortness of breath. 06/05/12   Hommel, Hilliard Clark, DO  ALPRAZolam (XANAX) 0.5 MG tablet TAKE 1/2 TO 1 TABLET BY MOUTH TWICE DAILY AS NEEDED FOR ANXIETY(MUST LAST 30 DAYS) 12/03/20   Hurst, Dorothea Glassman, PA-C  amphetamine-dextroamphetamine (ADDERALL) 30 MG tablet Take 1 tablet by mouth 2 (two) times daily. 01/13/21   Donnal Moat T, PA-C  amphetamine-dextroamphetamine (ADDERALL) 30 MG tablet Take 1 tablet by mouth 2 (two) times daily.  12/15/20   Donnal Moat T, PA-C  amphetamine-dextroamphetamine (ADDERALL) 30 MG tablet Take 1 tablet by mouth 2 (two) times daily. 11/15/20   Donnal Moat T, PA-C  apixaban (ELIQUIS) 2.5 MG TABS tablet Take 1 tablet (2.5 mg total) by mouth every 12 (twelve) hours. 11/26/20 12/26/20  Cherlynn June B, PA  ARIPiprazole (ABILIFY) 10 MG tablet TAKE 1 TABLET(10 MG) BY MOUTH DAILY Patient taking differently: Take 10 mg by mouth daily. 08/11/20   Donnal Moat T, PA-C  docusate sodium (COLACE) 100 MG capsule Take 1 capsule (100 mg total) by mouth 2 (two) times daily. 11/26/20   Cherlynn June B, PA  DULoxetine (CYMBALTA) 60 MG capsule TAKE 1 CAPSULE(60 MG) BY MOUTH DAILY 11/10/20   Adelene Idler, Helene Kelp T, PA-C  fluticasone furoate-vilanterol (BREO ELLIPTA) 100-25 MCG/INH AEPB Inhale 1 puff into the lungs daily at 12 noon.    [provider]  hydrOXYzine (ATARAX/VISTARIL) 10 MG tablet TAKE 1 TO 3 TABLETS(10 TO  30 MG) BY MOUTH THREE TIMES DAILY AS NEEDED FOR ANXIETY Patient taking differently: Take 10 mg by mouth every 6 (six) hours as needed for anxiety. 10/28/20   Addison Lank, PA-C  NARCAN 4 MG/0.1ML LIQD nasal spray kit Place 4 mg into the nose once. 06/17/20   [provider]  ondansetron (ZOFRAN) 4 MG tablet Take 1 tablet (4 mg total) by mouth every 6 (six) hours as needed for nausea. 11/26/20   Dorothyann Peng, PA  oxyCODONE-acetaminophen (PERCOCET) 10-325 MG tablet Take 1 tablet by mouth 5 (five) times daily. 12/19/18   [provider]  senna (SENOKOT) 8.6 MG TABS tablet Take 1 tablet (8.6 mg total) by mouth 2 (two) times daily. 11/26/20   Dorothyann Peng, PA  varenicline (CHANTIX) 0.5 MG tablet 1 p.o. daily for 2 days, then 1 p.o. twice daily until gone.  Then she will take the 1 mg prescription Patient taking differently: Take 0.5 mg by mouth See admin instructions. 1 p.o. daily for 2 days, then 1 p.o. twice daily until gone.  Then she will take the 1 mg prescription 10/28/20    Donnal Moat T, PA-C  varenicline (CHANTIX) 1 MG tablet TAKE 1 TABLET BY MOUTH TWICE DAILY. START AFTER COMPLETING THE TWO WEEKS OF 0.5MG 12/01/20   Donnal Moat T, PA-C    Allergies    Aspirin and Sulfonamide derivatives  Review of Systems   Review of Systems  Constitutional: Negative for chills and fever.  HENT: Negative for congestion and rhinorrhea.   Eyes: Negative for redness and visual disturbance.  Respiratory: Negative for shortness of breath and wheezing.   Cardiovascular: Negative for chest pain and palpitations.  Gastrointestinal: Negative for nausea and vomiting.  Genitourinary: Negative for dysuria and urgency.  Musculoskeletal: Positive for arthralgias and myalgias.  Skin: Negative for pallor and wound.  Neurological: Negative for dizziness and headaches.    Physical Exam Updated Vital Signs BP 103/68   Pulse 92   Temp 98.2 F (36.8 C) (Oral)   Resp 16   SpO2 100%   Physical Exam Vitals and nursing note reviewed.  Constitutional:      General: She is not in acute distress.    Appearance: She is well-developed. She is not diaphoretic.  HENT:     Head: Normocephalic and atraumatic.  Eyes:     Pupils: Pupils are equal, round, and reactive to light.  Cardiovascular:     Rate and Rhythm: Normal rate and regular rhythm.     Heart sounds: No murmur heard. No friction rub. No gallop.   Pulmonary:     Effort: Pulmonary effort is normal.     Breath sounds: No wheezing or rales.  Abdominal:     General: There is no distension.     Palpations: Abdomen is soft.     Tenderness: There is no abdominal tenderness.  Musculoskeletal:        General: Swelling and tenderness present.     Cervical back: Normal range of motion and neck supple.     Comments: Pain and swelling to the right lower extremity.  3+ pitting up to the thigh.  Surgical wound to the lateral aspect of the leg clean dry and intact.  Very minimal if any erythema.  No drainage.  Dopplerable pulse   Skin:    General: Skin is warm and dry.  Neurological:     Mental Status: She is alert and oriented to person, place, and time.  Psychiatric:  Behavior: Behavior normal.     ED Results / Procedures / Treatments   Labs (all labs ordered are listed, but only abnormal results are displayed) Labs Reviewed  COMPREHENSIVE METABOLIC PANEL - Abnormal; Notable for the following components:      Result Value   Calcium 8.7 (*)    Total Protein 6.4 (*)    All other components within normal limits  CBC WITH DIFFERENTIAL/PLATELET - Abnormal; Notable for the following components:   RBC 2.46 (*)    Hemoglobin 7.6 (*)    HCT 24.6 (*)    nRBC 0.4 (*)    Abs Immature Granulocytes 0.10 (*)    All other components within normal limits  TYPE AND SCREEN  TROPONIN I (HIGH SENSITIVITY)  TROPONIN I (HIGH SENSITIVITY)    EKG EKG Interpretation  Date/Time:  Saturday December 04 2020 22:05:02 EDT Ventricular Rate:  86 PR Interval:  170 QRS Duration: 107 QT Interval:  361 QTC Calculation: 432 R Axis:   71 Text Interpretation: Sinus rhythm No significant change since last tracing Confirmed by Deno Etienne [phone]) on 12/04/2020 11:13:08 PM   Radiology No results found.  Procedures Procedures   Medications Ordered in ED Medications  ketorolac (TORADOL) injection 15 mg (15 mg Intramuscular Given 12/05/20 0100)    ED Course  I have reviewed the triage vital signs and the nursing notes.  Pertinent labs & imaging results that were available during my care of the patient were reviewed by me and considered in my medical decision making (see chart for details).    MDM Rules/Calculators/A&P                          65 yo F with a chief complaints of right hip pain.  She is recently postop from a hip replacement.  On exam the patient likely has a large postoperative hematoma.  She has intact pulses.  Intact motor and sensation.  Hemoglobin here is stable from immediately postop.  Pain improved  here with a dose of Toradol.  She is on a heavy narcotic regiment at home.  We will have her try and add Tylenol scheduled.  Follow-up with orthopedic in the office.  I feel that hematoma is by far the most likely diagnosis.  I feel that DVT is much less likely based on the presentation.  She is currently on anticoagulation.  2:04 AM:  I have discussed the diagnosis/risks/treatment options with the patient and believe the pt to be eligible for discharge home to follow-up with Ortho. We also discussed returning to the ED immediately if new or worsening sx occur. We discussed the sx which are most concerning (e.g., sudden worsening pain, fever, inability to tolerate by mouth) that necessitate immediate return. Medications administered to the patient during their visit and any new prescriptions provided to the patient are listed below.  Medications given during this visit Medications  ketorolac (TORADOL) injection 15 mg (15 mg Intramuscular Given 12/05/20 0100)     The patient appears reasonably screen and/or stabilized for discharge and I doubt any other medical condition or other [facility] requiring further screening, evaluation, or treatment in the ED at this time prior to discharge.   Final Clinical Impression(s) / ED Diagnoses Final diagnoses:  Right hip pain    Rx / DC Orders ED Discharge Orders    None       Deno Etienne, DO 12/05/20 0204

## 2020-12-27 ENCOUNTER — Encounter (HOSPITAL_BASED_OUTPATIENT_CLINIC_OR_DEPARTMENT_OTHER): Payer: Self-pay | Admitting: Emergency Medicine

## 2020-12-27 ENCOUNTER — Other Ambulatory Visit: Payer: Self-pay

## 2020-12-27 ENCOUNTER — Inpatient Hospital Stay (HOSPITAL_BASED_OUTPATIENT_CLINIC_OR_DEPARTMENT_OTHER)
Admission: EM | Admit: 2020-12-27 | Discharge: 2020-12-31 | DRG: 194 | Disposition: A | Discharge status: Home / Self Care | Payer: Medicare Other | Attending: Internal Medicine | Admitting: Internal Medicine

## 2020-12-27 ENCOUNTER — Emergency Department (HOSPITAL_BASED_OUTPATIENT_CLINIC_OR_DEPARTMENT_OTHER): Payer: Medicare Other

## 2020-12-27 ENCOUNTER — Emergency Department (HOSPITAL_BASED_OUTPATIENT_CLINIC_OR_DEPARTMENT_OTHER): Payer: Medicare Other | Admitting: Radiology

## 2020-12-27 DIAGNOSIS — F1921 Other psychoactive substance dependence, in remission: Secondary | ICD-10-CM | POA: Diagnosis present

## 2020-12-27 DIAGNOSIS — D62 Acute posthemorrhagic anemia: Secondary | ICD-10-CM | POA: Diagnosis present

## 2020-12-27 DIAGNOSIS — Z8261 Family history of arthritis: Secondary | ICD-10-CM

## 2020-12-27 DIAGNOSIS — J189 Pneumonia, unspecified organism: Secondary | ICD-10-CM | POA: Diagnosis not present

## 2020-12-27 DIAGNOSIS — F988 Other specified behavioral and emotional disorders with onset usually occurring in childhood and adolescence: Secondary | ICD-10-CM | POA: Diagnosis present

## 2020-12-27 DIAGNOSIS — Z96641 Presence of right artificial hip joint: Secondary | ICD-10-CM | POA: Diagnosis present

## 2020-12-27 DIAGNOSIS — Z90710 Acquired absence of both cervix and uterus: Secondary | ICD-10-CM

## 2020-12-27 DIAGNOSIS — Z888 Allergy status to other drugs, medicaments and biological substances status: Secondary | ICD-10-CM

## 2020-12-27 DIAGNOSIS — F909 Attention-deficit hyperactivity disorder, unspecified type: Secondary | ICD-10-CM | POA: Diagnosis present

## 2020-12-27 DIAGNOSIS — Z7951 Long term (current) use of inhaled steroids: Secondary | ICD-10-CM

## 2020-12-27 DIAGNOSIS — Z79899 Other long term (current) drug therapy: Secondary | ICD-10-CM

## 2020-12-27 DIAGNOSIS — Z9049 Acquired absence of other specified parts of digestive tract: Secondary | ICD-10-CM

## 2020-12-27 DIAGNOSIS — Z800 Family history of malignant neoplasm of digestive organs: Secondary | ICD-10-CM

## 2020-12-27 DIAGNOSIS — Z882 Allergy status to sulfonamides status: Secondary | ICD-10-CM

## 2020-12-27 DIAGNOSIS — F419 Anxiety disorder, unspecified: Secondary | ICD-10-CM | POA: Diagnosis present

## 2020-12-27 DIAGNOSIS — Z7901 Long term (current) use of anticoagulants: Secondary | ICD-10-CM

## 2020-12-27 DIAGNOSIS — F329 Major depressive disorder, single episode, unspecified: Secondary | ICD-10-CM | POA: Diagnosis present

## 2020-12-27 DIAGNOSIS — J9601 Acute respiratory failure with hypoxia: Secondary | ICD-10-CM

## 2020-12-27 DIAGNOSIS — I728 Aneurysm of other specified arteries: Secondary | ICD-10-CM | POA: Diagnosis present

## 2020-12-27 DIAGNOSIS — F17210 Nicotine dependence, cigarettes, uncomplicated: Secondary | ICD-10-CM | POA: Diagnosis present

## 2020-12-27 DIAGNOSIS — Z833 Family history of diabetes mellitus: Secondary | ICD-10-CM

## 2020-12-27 DIAGNOSIS — G894 Chronic pain syndrome: Secondary | ICD-10-CM | POA: Diagnosis present

## 2020-12-27 DIAGNOSIS — Z20822 Contact with and (suspected) exposure to covid-19: Secondary | ICD-10-CM | POA: Diagnosis present

## 2020-12-27 DIAGNOSIS — J440 Chronic obstructive pulmonary disease with acute lower respiratory infection: Secondary | ICD-10-CM | POA: Diagnosis present

## 2020-12-27 DIAGNOSIS — Z79891 Long term (current) use of opiate analgesic: Secondary | ICD-10-CM

## 2020-12-27 DIAGNOSIS — J45901 Unspecified asthma with (acute) exacerbation: Secondary | ICD-10-CM | POA: Diagnosis present

## 2020-12-27 DIAGNOSIS — J441 Chronic obstructive pulmonary disease with (acute) exacerbation: Secondary | ICD-10-CM | POA: Diagnosis present

## 2020-12-27 LAB — BASIC METABOLIC PANEL
Anion gap: 7 (ref 5–15)
BUN: 22 mg/dL (ref 8–23)
CO2: 28 mmol/L (ref 22–32)
Calcium: 9.2 mg/dL (ref 8.9–10.3)
Chloride: 106 mmol/L (ref 98–111)
Creatinine, Ser: 0.77 mg/dL (ref 0.44–1.00)
GFR, Estimated: >60 mL/min (ref >60)
Glucose, Bld: 119 mg/dL — ABNORMAL HIGH (ref 70–99)
Potassium: 4.1 mmol/L (ref 3.5–5.1)
Sodium: 141 mmol/L (ref 135–145)

## 2020-12-27 LAB — CBC WITH DIFFERENTIAL/PLATELET
Abs Immature Granulocytes: 0.06 10*3/uL (ref 0.00–0.07)
Basophils Absolute: 0 10*3/uL (ref 0.0–0.1)
Basophils Relative: 0 %
Eosinophils Absolute: 0 10*3/uL (ref 0.0–0.5)
Eosinophils Relative: 0 %
HCT: 32 % — ABNORMAL LOW (ref 36.0–46.0)
Hemoglobin: 10.3 g/dL — ABNORMAL LOW (ref 12.0–15.0)
Immature Granulocytes: 0 %
Lymphocytes Relative: 10 %
Lymphs Abs: 1.5 10*3/uL (ref 0.7–4.0)
MCH: 29.8 pg (ref 26.0–34.0)
MCHC: 32.2 g/dL (ref 30.0–36.0)
MCV: 92.5 fL (ref 80.0–100.0)
Monocytes Absolute: 0.6 10*3/uL (ref 0.1–1.0)
Monocytes Relative: 4 %
Neutro Abs: 12.9 10*3/uL — ABNORMAL HIGH (ref 1.7–7.7)
Neutrophils Relative %: 86 %
Platelets: 236 10*3/uL (ref 150–400)
RBC: 3.46 MIL/uL — ABNORMAL LOW (ref 3.87–5.11)
RDW: 13.6 % (ref 11.5–15.5)
WBC: 15 10*3/uL — ABNORMAL HIGH (ref 4.0–10.5)
nRBC: 0 % (ref 0.0–0.2)

## 2020-12-27 LAB — RESP PANEL BY RT-PCR (FLU A&B, COVID) ARPGX2
Influenza A by PCR: NEGATIVE
Influenza B by PCR: NEGATIVE
SARS Coronavirus 2 by RT PCR: NEGATIVE

## 2020-12-27 LAB — TROPONIN I (HIGH SENSITIVITY)
Troponin I (High Sensitivity): 3 ng/L (ref <18)
Troponin I (High Sensitivity): 2 ng/L (ref <18)

## 2020-12-27 IMAGING — CT CT ANGIO CHEST
2 of 7 series · 17 of 46 positions shown · IV contrast (omnipaque)
Comparison: Chest radiograph [DATE]

CLINICAL DATA: Pulmonary embolus suspected with high probability.
Recent surgery. Shortness of breath and hypoxia.

EXAM:
CT ANGIOGRAPHY CHEST WITH CONTRAST
TECHNIQUE: Multidetector CT imaging of the chest was performed using the
standard protocol during bolus administration of intravenous
contrast. Multiplanar CT image reconstructions and MIPs were
obtained to evaluate the vascular anatomy.
CONTRAST:  75mL OMNIPAQUE IOHEXOL 350 MG/ML SOLN

[Series 5: pe axial thins · axial · 0.69mm/px · z∈[-640,-406]mm · 14 of 270 slices shown]
[im 18/270  lung]
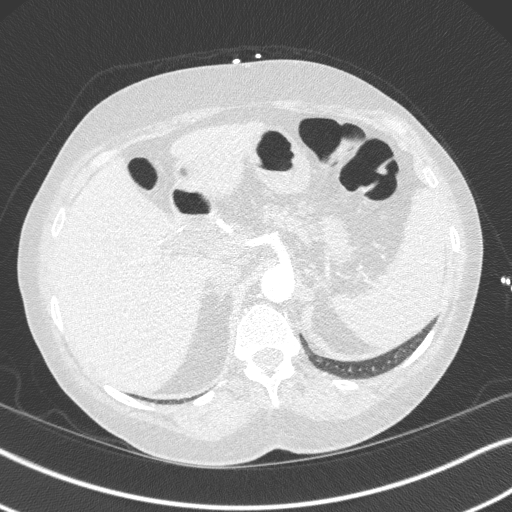
[im 36/270  soft-tissue]
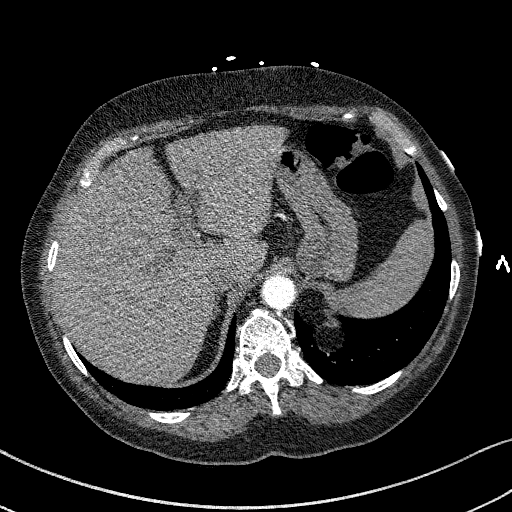
[im 54/270  lung]
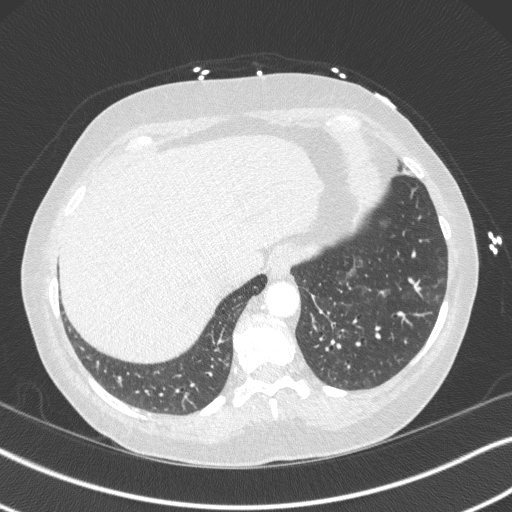
[im 72/270  soft-tissue]
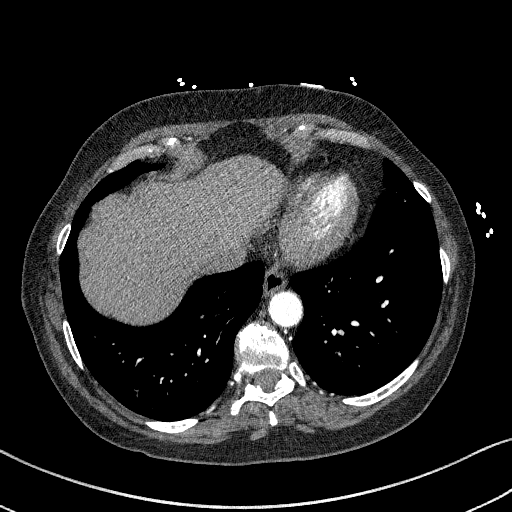
[im 90/270  lung]
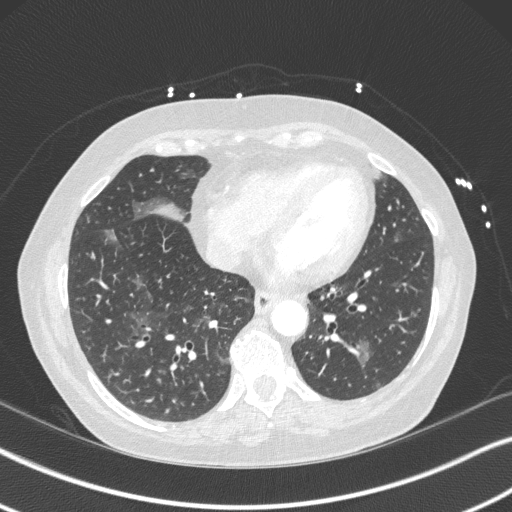
[im 108/270  soft-tissue]
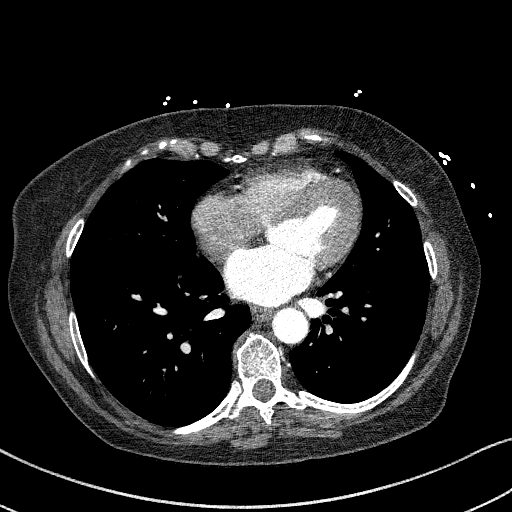
[im 126/270  lung]
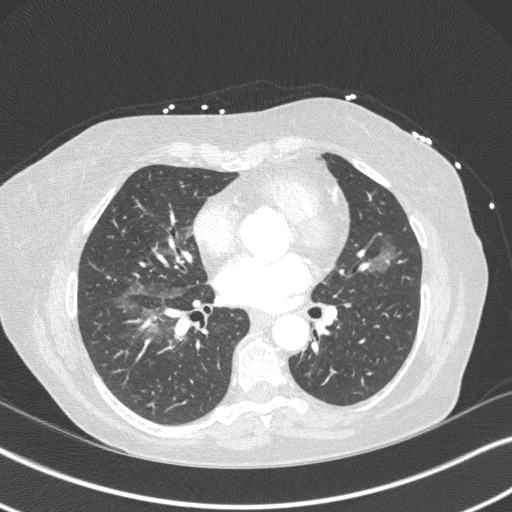
[im 144/270  soft-tissue]
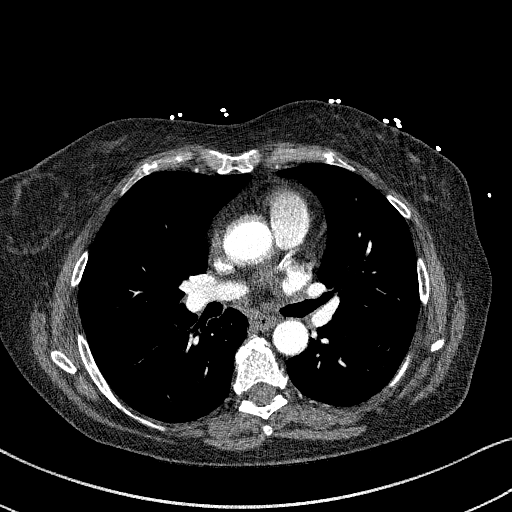
[im 162/270  lung]
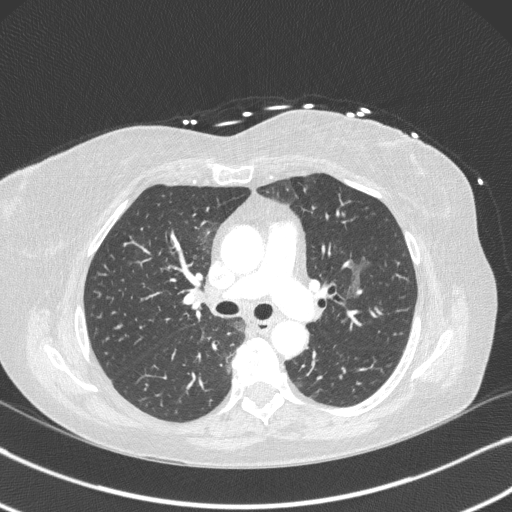
[im 180/270  soft-tissue]
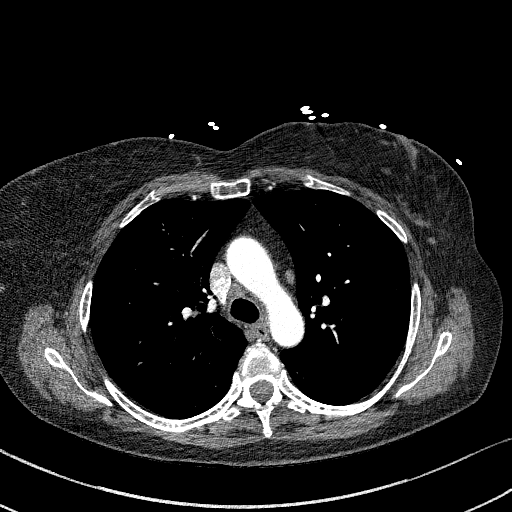
[im 198/270  lung]
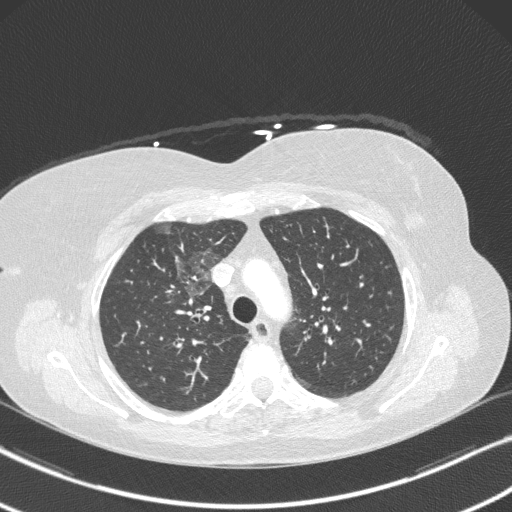
[im 216/270  soft-tissue]
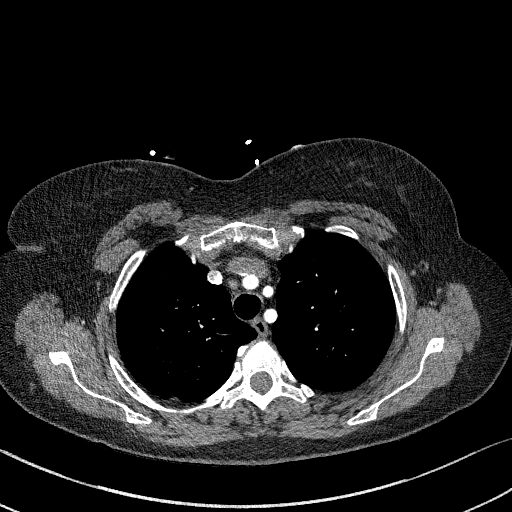
[im 234/270  lung]
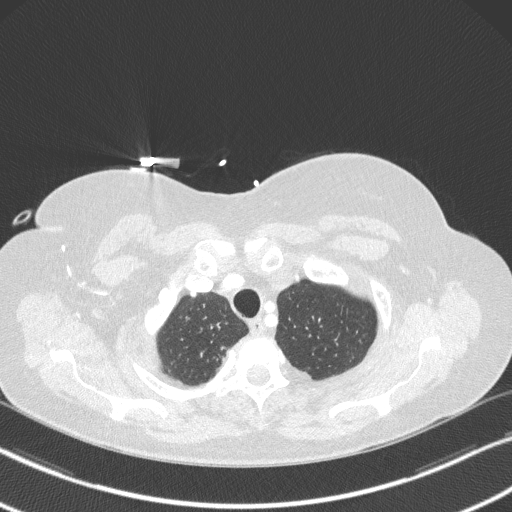
[im 252/270  soft-tissue]
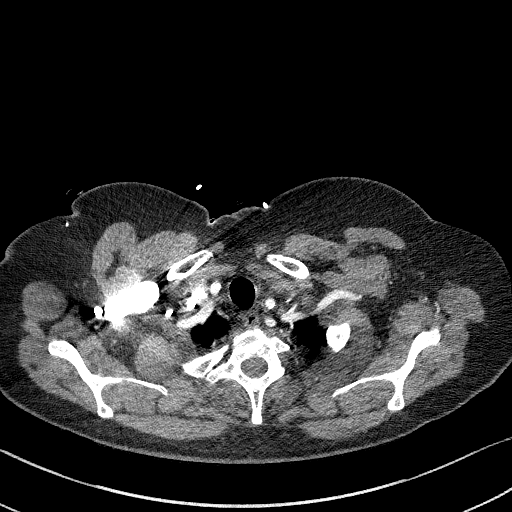

[Series 7: cor soft · coronal · 0.56mm/px · 3 of 116 slices shown]
[im 29/116  soft-tissue]
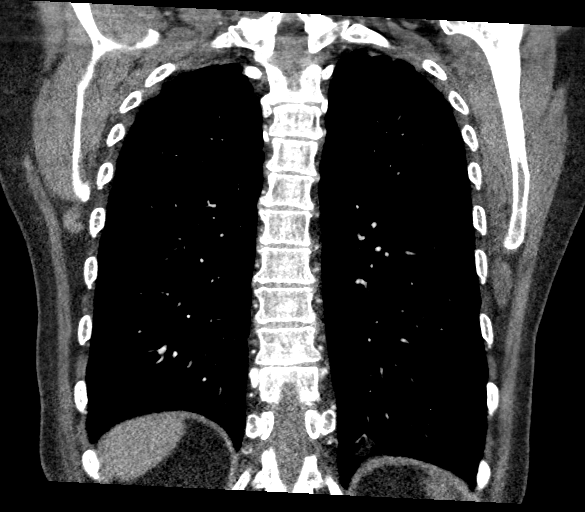
[im 58/116  soft-tissue]
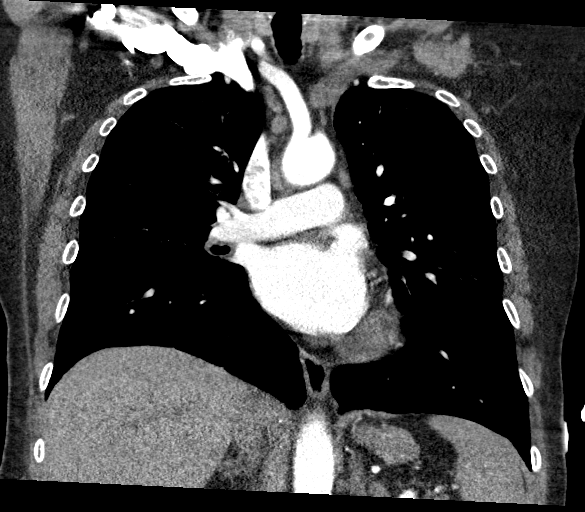
[im 87/116  soft-tissue]
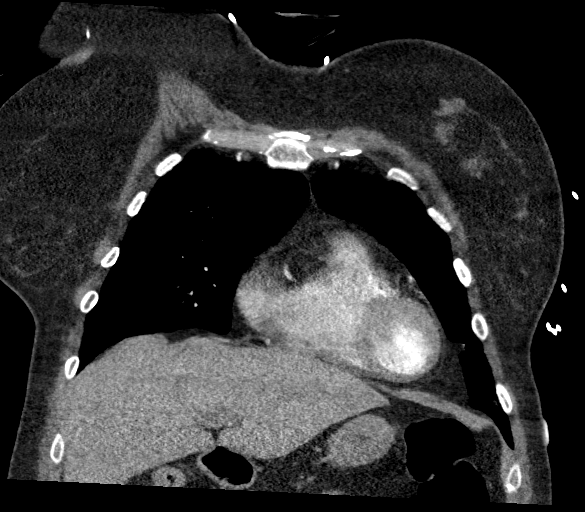

[17 of 46 positions shown; findings below may reference images not displayed]

FINDINGS: Cardiovascular: There is good opacification of the central and
segmental pulmonary arteries. No focal filling defects. No evidence
of significant pulmonary embolus. Normal heart size. No pericardial
effusions. Normal caliber thoracic aorta. No aortic dissection.
Great vessel origins are patent.

Mediastinum/Nodes: Esophagus is decompressed. Scattered mediastinal
lymph nodes are not pathologically enlarged. Thyroid gland is
unremarkable.

Lungs/Pleura: Patchy focal areas of airspace disease scattered
throughout both lungs, most prominent in the perihilar and basilar
regions. Changes may represent multifocal pneumonia. No pleural
effusions. No pneumothorax.

Upper Abdomen: No acute abnormalities demonstrated in the visualized
upper abdomen. Gallbladder is surgically absent. Calcified splenic
artery aneurysm measuring 1.4 cm diameter.

Musculoskeletal: Degenerative changes in the spine. No destructive
bone lesions.

Review of the MIP images confirms the above findings.
IMPRESSION: 1. No evidence of significant pulmonary embolus.
2. Patchy focal areas of airspace disease scattered throughout both
lungs, most prominent in the perihilar and basilar regions. Changes
may represent multifocal pneumonia.
3. Calcified splenic artery aneurysm measuring 1.4 cm diameter.

## 2020-12-27 IMAGING — DX DG CHEST 2V
2 series · 2 of 2 positions shown · non-contrast
Comparison: None.

CLINICAL DATA: Congestion, cough, shortness of breath for 2 days.

EXAM:
CHEST - 2 VIEW

[chest pa]
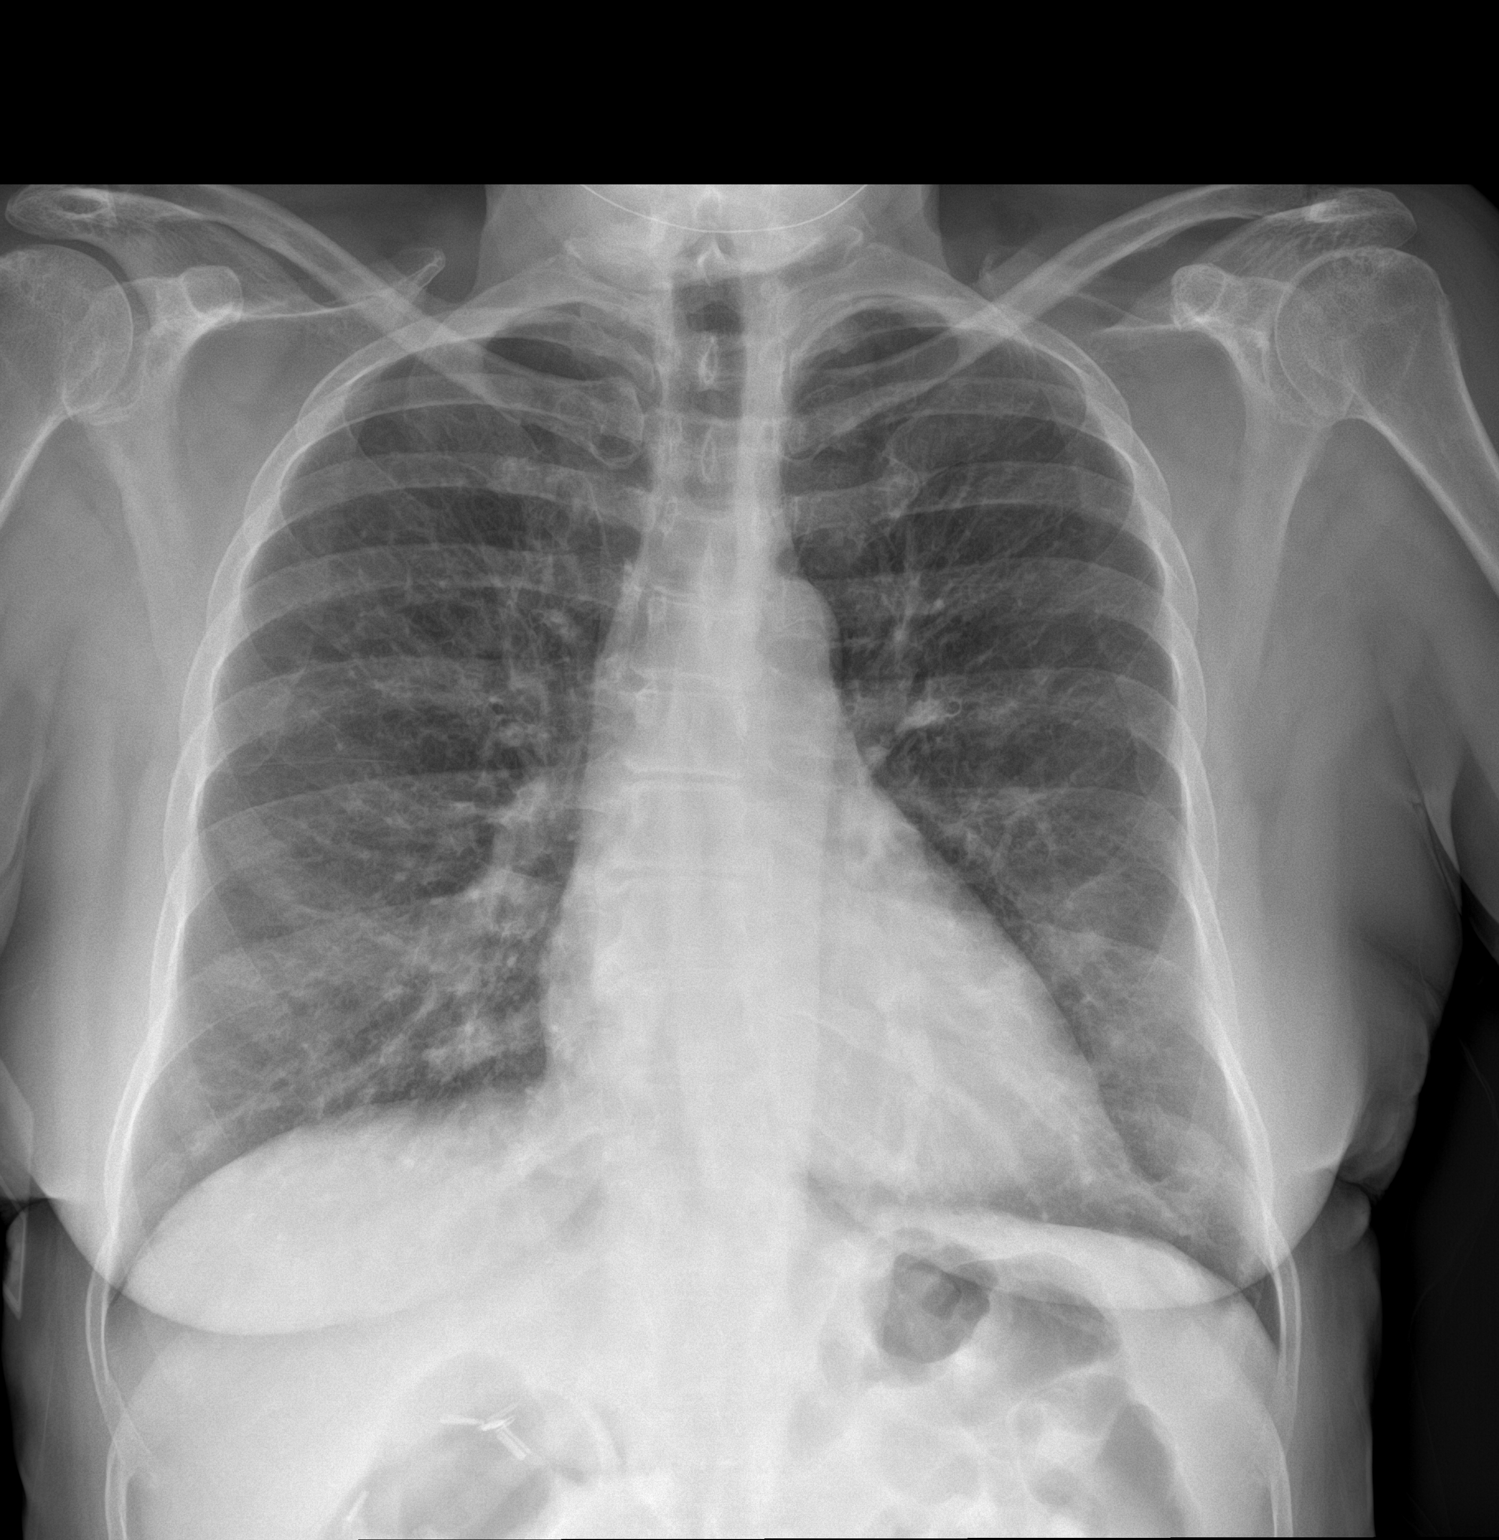

[chest lat]
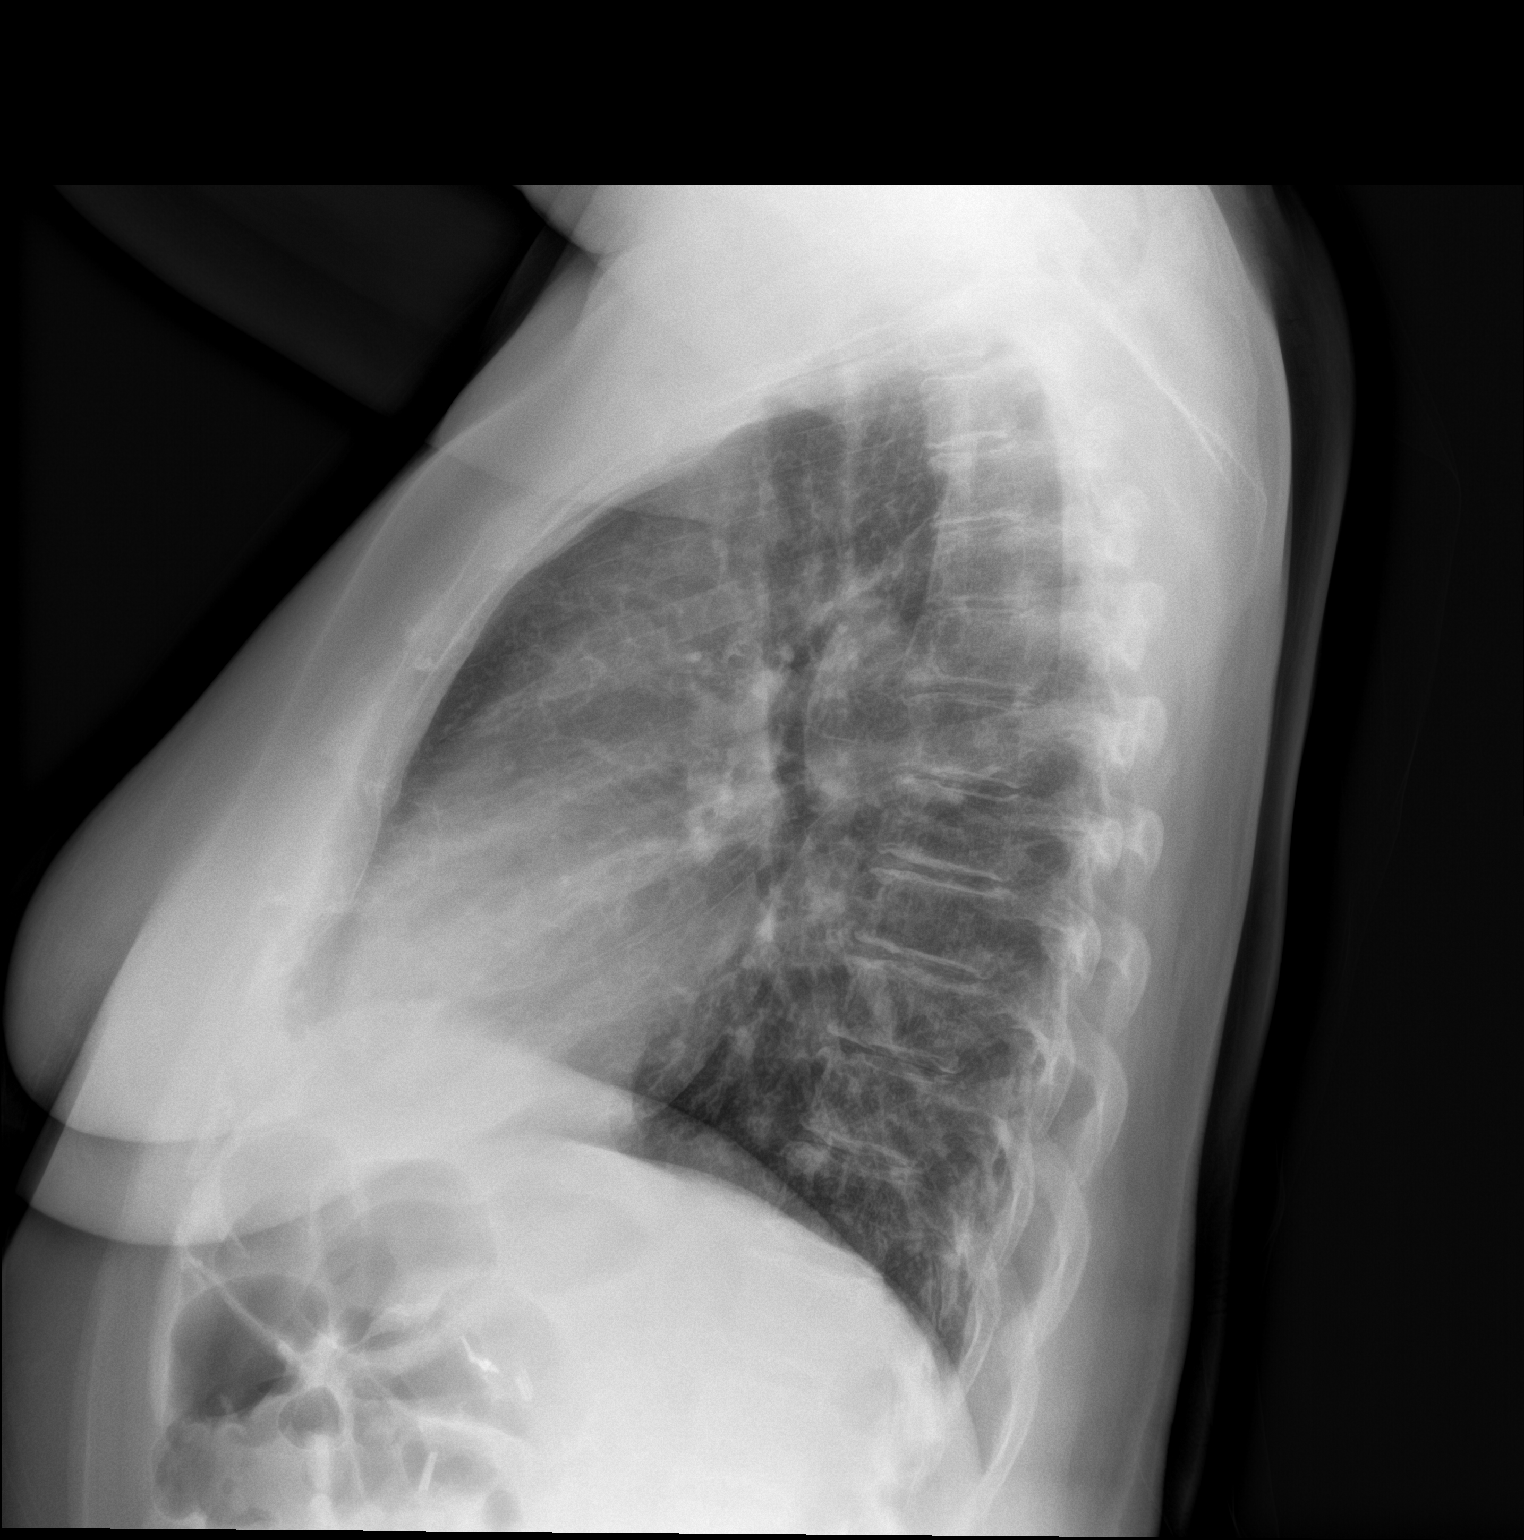

[2 of 2 positions shown; findings below may reference images not displayed]

FINDINGS: The heart size and mediastinal contours are within normal limits.
Both lungs are clear. The visualized skeletal structures are
unremarkable.
IMPRESSION: No active cardiopulmonary disease.

## 2020-12-27 MED ORDER — IPRATROPIUM-ALBUTEROL 0.5-2.5 (3) MG/3ML IN SOLN
3.0000 mL | Freq: Once | RESPIRATORY_TRACT | Status: AC
  Administered 2020-12-27: 3 mL via RESPIRATORY_TRACT
  Filled 2020-12-27: qty 3

## 2020-12-27 MED ORDER — SODIUM CHLORIDE 0.9 % IV SOLN
1.0000 g | Freq: Once | INTRAVENOUS | Status: AC
  Administered 2020-12-27: 1 g via INTRAVENOUS
  Filled 2020-12-27: qty 10

## 2020-12-27 MED ORDER — SODIUM CHLORIDE 0.9 % IV SOLN
500.0000 mg | Freq: Once | INTRAVENOUS | Status: AC
  Administered 2020-12-27: 500 mg via INTRAVENOUS
  Filled 2020-12-27: qty 500

## 2020-12-27 MED ORDER — ALBUTEROL SULFATE HFA 108 (90 BASE) MCG/ACT IN AERS
2.0000 | INHALATION_SPRAY | RESPIRATORY_TRACT | Status: DC | PRN
  Administered 2020-12-27: 2 via RESPIRATORY_TRACT
  Filled 2020-12-27: qty 6.7

## 2020-12-27 MED ORDER — METHYLPREDNISOLONE SODIUM SUCC 125 MG IJ SOLR
125.0000 mg | Freq: Once | INTRAMUSCULAR | Status: AC
  Administered 2020-12-27: 125 mg via INTRAVENOUS
  Filled 2020-12-27: qty 2

## 2020-12-27 MED ORDER — IPRATROPIUM-ALBUTEROL 0.5-2.5 (3) MG/3ML IN SOLN: 3.0000 mL | RESPIRATORY_TRACT | Status: DC | PRN

## 2020-12-27 MED ORDER — IOHEXOL 350 MG/ML SOLN
100.0000 mL | Freq: Once | INTRAVENOUS | Status: AC | PRN
  Administered 2020-12-27: 75 mL via INTRAVENOUS

## 2020-12-27 NOTE — ED Provider Notes (Signed)
Diana Alvarado   CSN: [id number] Arrival date & time: 12/27/20  1833     History No chief complaint on file.   Diana Alvarado is a 65 y.o. female.  Presents to ER with concern for cough, shortness of breath.  Patient reports symptoms ongoing for the last 2 days.  Having significant cough with congestion, states that she feels more short of breath than normal.  No chest pain.  Has a history of asthma and was taking antibiotics, inhaler and steroids but not getting any better.  No fevers, no chills.  No leg swelling.  States that she was taking a blood thinner up until a few days ago for prevention of blood clot after recently having a hip surgery.  Hip replacement in late March with Dr. Lyla Glassing.  HPI     Past Medical History:  Diagnosis Date  . ADHD   . Anxiety 11/17/2011  . Asthma 04/19/2011  . Chronic kidney disease    Stage III  . Chronic LBP 04/19/2011  . Depression 04/19/2011  . DJD (degenerative joint disease), cervical 04/19/2011  . Emphysema, unspecified ([place])   . Lumbar disc disease 04/19/2011  . Pulmonary nodule   . Splenic artery aneurysm ([place])   . UTI (lower urinary tract infection)     Patient Active Problem List   Diagnosis Date Noted  . Osteoarthritis of right hip 11/25/2020  . Chronic lumbar radicular pain (Left) 04/29/2018  . Cervicalgia (Bilateral) 04/29/2018  . Osteoarthritis of hands (Bilateral) 04/29/2018  . Cervical fusion syndrome 04/29/2018  . History of fusion of cervical spine 04/29/2018  . 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 History of substance use disorder 04/29/2018  . Cervical facet arthropathy (Bilateral) 04/29/2018  . Cervical facet syndrome (Bilateral) 04/29/2018  . Sacroiliac joint dysfunction (Left) 04/29/2018  . Chronic sacroiliac joint pain (Left) 04/29/2018  . Lumbar facet syndrome (Bilateral) (L>R) 04/29/2018  . Lumbar facet arthropathy (Bilateral) 04/29/2018  .  Spondylosis without myelopathy or radiculopathy, lumbosacral region 04/29/2018  . Other specified dorsopathies, sacral and sacrococcygeal region 04/29/2018  . DDD (degenerative disc disease), lumbar 04/29/2018  . Osteoarthritis of hips (Bilateral) 04/29/2018  . Chronic myofascial pain 04/29/2018  . Neurogenic pain 04/29/2018  . Chronic low back pain (Primary Area of Pain) (Bilateral) (L>R) w/ sciatica (Left) 03/26/2018  . Chronic lower extremity pain (Secondary Area of Pain) (Bilateral) (L>R) 03/26/2018  . Chronic neck pain [facility] Area of Pain) (Bilateral) 03/26/2018  . Chronic hand pain (Fourth Area of Pain) (Bilateral) 03/26/2018  . Chronic hip pain (Left) 03/26/2018  . Chronic pain syndrome 03/26/2018  . Long term current use of opiate analgesic 03/26/2018  . Pharmacologic therapy 03/26/2018  . Disorder of skeletal system 03/26/2018  . Problems influencing health status 03/26/2018  . Impingement syndrome of shoulder region (Right) 02/04/2018  . Splenic artery aneurysm ([place]) 09/12/2016  . Insomnia 12/08/2013  . Ganglion cyst 12/24/2012  . Protrusion of cervical intervertebral disc 12/24/2012  . Cigarette nicotine dependence without complication 02/28/9484  . Personal history of drug dependence ([place]) 06/05/2012  . Cervical post-laminectomy syndrome 02/06/2012  . Anxiety state 11/17/2011  . Asthma 04/19/2011  . Preventative health care 04/19/2011  . DDD (degenerative disc disease), cervical 04/19/2011  . Major depression 03/04/2009  . Attention deficit disorder 03/04/2009  . Generalized anxiety disorder 03/04/2009    Past Surgical History:  Procedure Laterality Date  . ABDOMINAL HYSTERECTOMY  1997  . APPENDECTOMY    . CHOLECYSTECTOMY    .  gall bladder  2005  . NECK SURGERY  1995  . ruptured disc  1992   repaired ruptured disc, neck  . TOTAL HIP ARTHROPLASTY Right 11/25/2020   Procedure: TOTAL HIP ARTHROPLASTY ANTERIOR APPROACH;  Surgeon: Rod Can, MD;  Location: [facility]  ORS;  Service: Orthopedics;  Laterality: Right;     OB History   No obstetric history on file.     Family History  Problem Relation Age of Onset  . Arthritis Other   . Cancer Other        cancer  . Diabetes Other   . Colon cancer Maternal Aunt   . Esophageal cancer Neg Hx   . Rectal cancer Neg Hx   . Stomach cancer Neg Hx     Social History   Tobacco Use  . Smoking status: Current Every Day Smoker    Packs/day: 1.00    Years: 10.00    Pack years: 10.00    Types: Cigarettes  . Smokeless tobacco: Never Used  Vaping Use  . Vaping Use: Never used  Substance Use Topics  . Alcohol use: No  . Drug use: No    Home Medications Prior to Admission medications   Medication Sig Start Date End Date Taking? Authorizing Provider  acetaminophen (TYLENOL) 500 MG tablet Take 500 mg by mouth every 6 (six) hours as needed for mild pain or moderate pain.    [provider]  albuterol (PROVENTIL HFA;VENTOLIN HFA) 108 (90 BASE) MCG/ACT inhaler Inhale 2 puffs into the lungs every 6 (six) hours as needed for wheezing or shortness of breath. 06/05/12   Hommel, Hilliard Clark, DO  ALPRAZolam (XANAX) 0.5 MG tablet TAKE 1/2 TO 1 TABLET BY MOUTH TWICE DAILY AS NEEDED FOR ANXIETY(MUST LAST 30 DAYS) 12/03/20   Hurst, Dorothea Glassman, PA-C  amphetamine-dextroamphetamine (ADDERALL) 30 MG tablet Take 1 tablet by mouth 2 (two) times daily. 01/13/21   Donnal Moat T, PA-C  amphetamine-dextroamphetamine (ADDERALL) 30 MG tablet Take 1 tablet by mouth 2 (two) times daily. 12/15/20   Donnal Moat T, PA-C  amphetamine-dextroamphetamine (ADDERALL) 30 MG tablet Take 1 tablet by mouth 2 (two) times daily. 11/15/20   Donnal Moat T, PA-C  apixaban (ELIQUIS) 2.5 MG TABS tablet Take 1 tablet (2.5 mg total) by mouth every 12 (twelve) hours. 11/26/20 12/26/20  Cherlynn June B, PA  ARIPiprazole (ABILIFY) 10 MG tablet TAKE 1 TABLET(10 MG) BY MOUTH DAILY Patient taking differently: Take 10 mg by mouth daily. 08/11/20   Donnal Moat  T, PA-C  docusate sodium (COLACE) 100 MG capsule Take 1 capsule (100 mg total) by mouth 2 (two) times daily. 11/26/20   Cherlynn June B, PA  DULoxetine (CYMBALTA) 60 MG capsule TAKE 1 CAPSULE(60 MG) BY MOUTH DAILY 11/10/20   Adelene Idler, Helene Kelp T, PA-C  fluticasone furoate-vilanterol (BREO ELLIPTA) 100-25 MCG/INH AEPB Inhale 1 puff into the lungs daily at 12 noon.    [provider]  hydrOXYzine (ATARAX/VISTARIL) 10 MG tablet TAKE 1 TO 3 TABLETS(10 TO 30 MG) BY MOUTH THREE TIMES DAILY AS NEEDED FOR ANXIETY Patient taking differently: Take 10 mg by mouth every 6 (six) hours as needed for anxiety. 10/28/20   Addison Lank, PA-C  NARCAN 4 MG/0.1ML LIQD nasal spray kit Place 4 mg into the nose once. 06/17/20   [provider]  ondansetron (ZOFRAN) 4 MG tablet Take 1 tablet (4 mg total) by mouth every 6 (six) hours as needed for nausea. 11/26/20   Dorothyann Peng, PA  oxyCODONE-acetaminophen (PERCOCET) 10-325 MG  tablet Take 1 tablet by mouth 5 (five) times daily. 12/19/18   [provider]  senna (SENOKOT) 8.6 MG TABS tablet Take 1 tablet (8.6 mg total) by mouth 2 (two) times daily. 11/26/20   Dorothyann Peng, PA  varenicline (CHANTIX) 0.5 MG tablet 1 p.o. daily for 2 days, then 1 p.o. twice daily until gone.  Then she will take the 1 mg prescription Patient taking differently: Take 0.5 mg by mouth See admin instructions. 1 p.o. daily for 2 days, then 1 p.o. twice daily until gone.  Then she will take the 1 mg prescription 10/28/20   Donnal Moat T, PA-C  varenicline (CHANTIX) 1 MG tablet TAKE 1 TABLET BY MOUTH TWICE DAILY. START AFTER COMPLETING THE TWO WEEKS OF 0.5MG 12/01/20   Donnal Moat T, PA-C    Allergies    Aspirin and Sulfonamide derivatives  Review of Systems   Review of Systems  Constitutional: Positive for chills and fatigue. Negative for fever.  HENT: Negative for ear pain and sore throat.   Eyes: Negative for pain and visual disturbance.  Respiratory: Positive  for cough and shortness of breath.   Cardiovascular: Negative for chest pain and palpitations.  Gastrointestinal: Negative for abdominal pain and vomiting.  Genitourinary: Negative for dysuria and hematuria.  Musculoskeletal: Negative for arthralgias and back pain.  Skin: Negative for color change and rash.  Neurological: Negative for seizures and syncope.  All other systems reviewed and are negative.   Physical Exam Updated Vital Signs BP 115/78   Pulse 84   Temp 98.2 F (36.8 C) (Oral)   Resp 18   Ht 5' 1"  (1.549 m)   Wt 70.6 kg   SpO2 98%   BMI 29.41 kg/m   Physical Exam Vitals and nursing Alvarado reviewed.  Constitutional:      General: She is not in acute distress.    Appearance: She is well-developed.  HENT:     Head: Normocephalic and atraumatic.  Eyes:     Conjunctiva/sclera: Conjunctivae normal.  Cardiovascular:     Rate and Rhythm: Normal rate and regular rhythm.     Heart sounds: No murmur heard.   Pulmonary:     Comments: Coarse breath sounds throughout, bilateral expiratory wheezing, slight tachypnea, requiring 2 L nasal cannula Abdominal:     Palpations: Abdomen is soft.     Tenderness: There is no abdominal tenderness.  Musculoskeletal:     Cervical back: Neck supple.  Skin:    General: Skin is warm and dry.  Neurological:     Mental Status: She is alert.     ED Results / Procedures / Treatments   Labs (all labs ordered are listed, but only abnormal results are displayed) Labs Reviewed  CBC WITH DIFFERENTIAL/PLATELET - Abnormal; Notable for the following components:      Result Value   WBC 15.0 (*)    RBC 3.46 (*)    Hemoglobin 10.3 (*)    HCT 32.0 (*)    Neutro Abs 12.9 (*)    All other components within normal limits  BASIC METABOLIC PANEL - Abnormal; Notable for the following components:   Glucose, Bld 119 (*)    All other components within normal limits  RESP PANEL BY RT-PCR (FLU A&B, COVID) ARPGX2  TROPONIN I (HIGH SENSITIVITY)     EKG EKG Interpretation  Date/Time:  Monday December 27 2020 19:29:32 EDT Ventricular Rate:  92 PR Interval:  172 QRS Duration: 96 QT Interval:  368 QTC Calculation: 455 R  Axis:   66 Text Interpretation: Normal sinus rhythm RSR' or QR pattern in V1 suggests right ventricular conduction delay Borderline ECG Confirmed by Madalyn Rob [phone]) on 12/27/2020 8:37:55 PM   Radiology DG Chest 2 View  Result Date: 12/27/2020 CLINICAL DATA:  Congestion, cough, shortness of breath for 2 days. EXAM: CHEST - 2 VIEW COMPARISON:  None. FINDINGS: The heart size and mediastinal contours are within normal limits. Both lungs are clear. The visualized skeletal structures are unremarkable. IMPRESSION: No active cardiopulmonary disease. Electronically Signed   By: Lucienne Capers M.D.   On: 12/27/2020 19:32   CT Angio Chest PE W and/or Wo Contrast  Result Date: 12/27/2020 CLINICAL DATA:  Pulmonary embolus suspected with high probability. Recent surgery. Shortness of breath and hypoxia. EXAM: CT ANGIOGRAPHY CHEST WITH CONTRAST TECHNIQUE: Multidetector CT imaging of the chest was performed using the standard protocol during bolus administration of intravenous contrast. Multiplanar CT image reconstructions and MIPs were obtained to evaluate the vascular anatomy. CONTRAST:  50m OMNIPAQUE IOHEXOL 350 MG/ML SOLN COMPARISON:  Chest radiograph 12/27/2020 FINDINGS: Cardiovascular: There is good opacification of the central and segmental pulmonary arteries. No focal filling defects. No evidence of significant pulmonary embolus. Normal heart size. No pericardial effusions. Normal caliber thoracic aorta. No aortic dissection. Great vessel origins are patent. Mediastinum/Nodes: Esophagus is decompressed. Scattered mediastinal lymph nodes are not pathologically enlarged. Thyroid gland is unremarkable. Lungs/Pleura: Patchy focal areas of airspace disease scattered throughout both lungs, most prominent in the perihilar and  basilar regions. Changes may represent multifocal pneumonia. No pleural effusions. No pneumothorax. Upper Abdomen: No acute abnormalities demonstrated in the visualized upper abdomen. Gallbladder is surgically absent. Calcified splenic artery aneurysm measuring 1.4 cm diameter. Musculoskeletal: Degenerative changes in the spine. No destructive bone lesions. Review of the MIP images confirms the above findings. IMPRESSION: 1. No evidence of significant pulmonary embolus. 2. Patchy focal areas of airspace disease scattered throughout both lungs, most prominent in the perihilar and basilar regions. Changes may represent multifocal pneumonia. 3. Calcified splenic artery aneurysm measuring 1.4 cm diameter. Electronically Signed   By: WLucienne CapersM.D.   On: 12/27/2020 22:19    Procedures Procedures   Medications Ordered in ED Medications  albuterol (VENTOLIN HFA) 108 (90 Base) MCG/ACT inhaler 2 puff (2 puffs Inhalation Given 12/27/20 1921)  methylPREDNISolone sodium succinate (SOLU-MEDROL) 125 mg/2 mL injection 125 mg (125 mg Intravenous Given 12/27/20 2114)  ipratropium-albuterol (DUONEB) 0.5-2.5 (3) MG/3ML nebulizer solution 3 mL (3 mLs Nebulization Given 12/27/20 2116)  iohexol (OMNIPAQUE) 350 MG/ML injection 100 mL (75 mLs Intravenous Contrast Given 12/27/20 2159)    ED Course  I have reviewed the triage vital signs and the nursing notes.  Pertinent labs & imaging results that were available during my care of the patient were reviewed by me and considered in my medical decision making (see chart for details).    MDM Rules/Calculators/A&P                         65year old lady with history of asthma, recent hip surgery presents to ER with concern for cough and shortness of breath.  On exam noted significant wheezing and rhonchorous breath sounds.  Mildly hypoxic requiring 2 L nasal cannula.  CXR initially was clear.  Given recent surgery, check CT PE study.  Demonstrated no PE but did have  findings of possible multifocal pneumonia.  Labs significant for leukocytosis.  She was started on steroids, provided nebulizer given the suspected reactive  airway disease flare.  Given the likely pneumonia, started on antibiotics.  Given her new oxygen requirement, believe she would benefit from admission.  Final Clinical Impression(s) / ED Diagnoses Final diagnoses:  Acute respiratory failure with hypoxia ([place])  Pneumonia due to infectious organism, unspecified laterality, unspecified part of lung    Rx / DC Orders ED Discharge Orders    None       Lucrezia Starch, MD 12/27/20 [phone]

## 2020-12-27 NOTE — ED Triage Notes (Signed)
Pt via pov from home with congestion, cough, shortness of breath x 2 days. Pt states she went to UC yesterday and was given antibiotics, inhaler, steroids. Pt has hx of asthma. Pt did not get a covid test yesterday. Pt alert & oriented, nad noted.

## 2020-12-28 ENCOUNTER — Encounter (HOSPITAL_COMMUNITY): Payer: Self-pay | Admitting: Internal Medicine

## 2020-12-28 DIAGNOSIS — Z7951 Long term (current) use of inhaled steroids: Secondary | ICD-10-CM | POA: Diagnosis not present

## 2020-12-28 DIAGNOSIS — F329 Major depressive disorder, single episode, unspecified: Secondary | ICD-10-CM | POA: Diagnosis present

## 2020-12-28 DIAGNOSIS — Z90710 Acquired absence of both cervix and uterus: Secondary | ICD-10-CM | POA: Diagnosis not present

## 2020-12-28 DIAGNOSIS — F17210 Nicotine dependence, cigarettes, uncomplicated: Secondary | ICD-10-CM | POA: Diagnosis present

## 2020-12-28 DIAGNOSIS — Z833 Family history of diabetes mellitus: Secondary | ICD-10-CM | POA: Diagnosis not present

## 2020-12-28 DIAGNOSIS — Z79891 Long term (current) use of opiate analgesic: Secondary | ICD-10-CM | POA: Diagnosis not present

## 2020-12-28 DIAGNOSIS — F909 Attention-deficit hyperactivity disorder, unspecified type: Secondary | ICD-10-CM | POA: Diagnosis present

## 2020-12-28 DIAGNOSIS — J189 Pneumonia, unspecified organism: Secondary | ICD-10-CM | POA: Diagnosis present

## 2020-12-28 DIAGNOSIS — D62 Acute posthemorrhagic anemia: Secondary | ICD-10-CM | POA: Diagnosis present

## 2020-12-28 DIAGNOSIS — F419 Anxiety disorder, unspecified: Secondary | ICD-10-CM | POA: Diagnosis present

## 2020-12-28 DIAGNOSIS — Z800 Family history of malignant neoplasm of digestive organs: Secondary | ICD-10-CM | POA: Diagnosis not present

## 2020-12-28 DIAGNOSIS — Z20822 Contact with and (suspected) exposure to covid-19: Secondary | ICD-10-CM | POA: Diagnosis present

## 2020-12-28 DIAGNOSIS — Z882 Allergy status to sulfonamides status: Secondary | ICD-10-CM | POA: Diagnosis not present

## 2020-12-28 DIAGNOSIS — Z9049 Acquired absence of other specified parts of digestive tract: Secondary | ICD-10-CM | POA: Diagnosis not present

## 2020-12-28 DIAGNOSIS — Z7901 Long term (current) use of anticoagulants: Secondary | ICD-10-CM | POA: Diagnosis not present

## 2020-12-28 DIAGNOSIS — J441 Chronic obstructive pulmonary disease with (acute) exacerbation: Secondary | ICD-10-CM | POA: Diagnosis present

## 2020-12-28 DIAGNOSIS — J45901 Unspecified asthma with (acute) exacerbation: Secondary | ICD-10-CM | POA: Diagnosis present

## 2020-12-28 DIAGNOSIS — J9601 Acute respiratory failure with hypoxia: Secondary | ICD-10-CM | POA: Diagnosis present

## 2020-12-28 DIAGNOSIS — I728 Aneurysm of other specified arteries: Secondary | ICD-10-CM | POA: Diagnosis present

## 2020-12-28 DIAGNOSIS — G894 Chronic pain syndrome: Secondary | ICD-10-CM | POA: Diagnosis present

## 2020-12-28 DIAGNOSIS — Z8261 Family history of arthritis: Secondary | ICD-10-CM | POA: Diagnosis not present

## 2020-12-28 DIAGNOSIS — Z96641 Presence of right artificial hip joint: Secondary | ICD-10-CM | POA: Diagnosis present

## 2020-12-28 DIAGNOSIS — Z888 Allergy status to other drugs, medicaments and biological substances status: Secondary | ICD-10-CM | POA: Diagnosis not present

## 2020-12-28 DIAGNOSIS — Z79899 Other long term (current) drug therapy: Secondary | ICD-10-CM | POA: Diagnosis not present

## 2020-12-28 DIAGNOSIS — J440 Chronic obstructive pulmonary disease with acute lower respiratory infection: Secondary | ICD-10-CM | POA: Diagnosis present

## 2020-12-28 LAB — HIV ANTIBODY (ROUTINE TESTING W REFLEX): HIV Screen 4th Generation wRfx: NONREACTIVE

## 2020-12-28 MED ORDER — IPRATROPIUM-ALBUTEROL 0.5-2.5 (3) MG/3ML IN SOLN
3.0000 mL | Freq: Four times a day (QID) | RESPIRATORY_TRACT | Status: DC
  Administered 2020-12-28 – 2020-12-30 (×10): 3 mL via RESPIRATORY_TRACT
  Filled 2020-12-28 (×10): qty 3

## 2020-12-28 MED ORDER — DOCUSATE SODIUM 100 MG PO CAPS
100.0000 mg | ORAL_CAPSULE | Freq: Two times a day (BID) | ORAL | Status: DC
  Administered 2020-12-28 – 2020-12-31 (×7): 100 mg via ORAL
  Filled 2020-12-28 (×7): qty 1

## 2020-12-28 MED ORDER — DULOXETINE HCL 60 MG PO CPEP
60.0000 mg | ORAL_CAPSULE | Freq: Every day | ORAL | Status: DC
  Administered 2020-12-28 – 2020-12-31 (×4): 60 mg via ORAL
  Filled 2020-12-28 (×4): qty 1

## 2020-12-28 MED ORDER — ARIPIPRAZOLE 10 MG PO TABS
10.0000 mg | ORAL_TABLET | Freq: Every day | ORAL | Status: DC
  Administered 2020-12-28 – 2020-12-31 (×4): 10 mg via ORAL
  Filled 2020-12-28 (×4): qty 1

## 2020-12-28 MED ORDER — NICOTINE 14 MG/24HR TD PT24
14.0000 mg | MEDICATED_PATCH | Freq: Every day | TRANSDERMAL | Status: DC
  Administered 2020-12-28 – 2020-12-31 (×4): 14 mg via TRANSDERMAL
  Filled 2020-12-28 (×5): qty 1

## 2020-12-28 MED ORDER — VARENICLINE TARTRATE 0.5 MG PO TABS
0.5000 mg | ORAL_TABLET | Freq: Every day | ORAL | Status: DC
  Administered 2020-12-28 – 2020-12-31 (×4): 0.5 mg via ORAL
  Filled 2020-12-28 (×4): qty 1

## 2020-12-28 MED ORDER — AZITHROMYCIN 250 MG PO TABS
500.0000 mg | ORAL_TABLET | Freq: Every day | ORAL | Status: DC
  Administered 2020-12-28 – 2020-12-30 (×3): 500 mg via ORAL
  Filled 2020-12-28 (×3): qty 2

## 2020-12-28 MED ORDER — OXYCODONE HCL 5 MG PO TABS
5.0000 mg | ORAL_TABLET | ORAL | Status: DC | PRN
  Administered 2020-12-28: 5 mg via ORAL
  Filled 2020-12-28: qty 1

## 2020-12-28 MED ORDER — ACETAMINOPHEN 325 MG PO TABS: 650.0000 mg | ORAL_TABLET | ORAL | Status: DC | PRN

## 2020-12-28 MED ORDER — METHYLPREDNISOLONE SODIUM SUCC 40 MG IJ SOLR
40.0000 mg | Freq: Three times a day (TID) | INTRAMUSCULAR | Status: AC
  Administered 2020-12-28 (×3): 40 mg via INTRAVENOUS
  Filled 2020-12-28 (×3): qty 1

## 2020-12-28 MED ORDER — BISACODYL 10 MG RE SUPP: 10.0000 mg | Freq: Every day | RECTAL | Status: DC | PRN

## 2020-12-28 MED ORDER — CEFTRIAXONE SODIUM 2 G IJ SOLR
2.0000 g | INTRAMUSCULAR | Status: DC
  Administered 2020-12-28 – 2020-12-30 (×3): 2 g via INTRAVENOUS
  Filled 2020-12-28 (×3): qty 2

## 2020-12-28 MED ORDER — ENOXAPARIN SODIUM 40 MG/0.4ML ~~LOC~~ SOLN
40.0000 mg | SUBCUTANEOUS | Status: DC
  Administered 2020-12-28 – 2020-12-31 (×4): 40 mg via SUBCUTANEOUS
  Filled 2020-12-28 (×4): qty 0.4

## 2020-12-28 MED ORDER — ONDANSETRON HCL 4 MG/2ML IJ SOLN: 4.0000 mg | Freq: Four times a day (QID) | INTRAMUSCULAR | Status: DC | PRN

## 2020-12-28 MED ORDER — ONDANSETRON HCL 4 MG PO TABS: 4.0000 mg | ORAL_TABLET | Freq: Four times a day (QID) | ORAL | Status: DC | PRN

## 2020-12-28 MED ORDER — OXYCODONE HCL 5 MG PO TABS
10.0000 mg | ORAL_TABLET | ORAL | Status: DC | PRN
  Administered 2020-12-28 – 2020-12-31 (×13): 10 mg via ORAL
  Filled 2020-12-28 (×13): qty 2

## 2020-12-28 MED ORDER — SENNA 8.6 MG PO TABS
1.0000 | ORAL_TABLET | Freq: Two times a day (BID) | ORAL | Status: DC
  Administered 2020-12-28 – 2020-12-31 (×7): 8.6 mg via ORAL
  Filled 2020-12-28 (×7): qty 1

## 2020-12-28 MED ORDER — ALPRAZOLAM 0.5 MG PO TABS
0.5000 mg | ORAL_TABLET | Freq: Two times a day (BID) | ORAL | Status: DC | PRN
  Administered 2020-12-28 – 2020-12-30 (×6): 0.5 mg via ORAL
  Filled 2020-12-28 (×7): qty 1

## 2020-12-28 MED ORDER — PREDNISONE 20 MG PO TABS
40.0000 mg | ORAL_TABLET | Freq: Every day | ORAL | Status: DC
  Administered 2020-12-29 – 2020-12-31 (×3): 40 mg via ORAL
  Filled 2020-12-28 (×3): qty 2

## 2020-12-28 MED ORDER — VARENICLINE TARTRATE 1 MG PO TABS
1.0000 mg | ORAL_TABLET | Freq: Every day | ORAL | Status: DC
  Filled 2020-12-28: qty 1

## 2020-12-28 MED ORDER — IPRATROPIUM-ALBUTEROL 0.5-2.5 (3) MG/3ML IN SOLN: 3.0000 mL | RESPIRATORY_TRACT | Status: DC | PRN

## 2020-12-28 NOTE — Evaluation (Signed)
Physical Therapy Evaluation Patient Details Name: Diana Alvarado MRN: [id number] DOB: 1956/01/28 Today's Date: 12/28/2020   History of Present Illness  65 yo female admitted with Pna. Hx of R THA 2022, DJD, DDD, ADHD, cervical fusion, chronic pain  Clinical Impression  On eval, pt was Supv for mobility. She walked ~75 feet with a RW. O2 85% on RA, 88% on 2L O2. Dyspnea 2/4. Pt was happy to mobilize. Recommend daily ambulation in hallway with nursing supervision and assist as needed. Do not anticipate any f/u PT needs after discharge.     Follow Up Recommendations No PT follow up    Equipment Recommendations  None recommended by PT    Recommendations for Other Services OT consult     Precautions / Restrictions Precautions Precautions: Fall Precaution Comments: monitor O2 Restrictions Weight Bearing Restrictions: No Other Position/Activity Restrictions: WBAT      Mobility  Bed Mobility Overal bed mobility: Needs Assistance Bed Mobility: Supine to Sit;Sit to Supine     Supine to sit: Supervision Sit to supine: Supervision   General bed mobility comments: for safety    Transfers Overall transfer level: Needs assistance Equipment used: None;Rolling walker (2 wheeled) Transfers: Sit to/from Stand Sit to Stand: Supervision         General transfer comment: for safety  Ambulation/Gait Ambulation/Gait assistance: Supervision Gait Distance (Feet): 75 Feet Assistive device: Rolling walker (2 wheeled) Gait Pattern/deviations: Step-through pattern;Decreased stride length     General Gait Details: for safety. O2 85% on RA, 88% on 2L  Stairs            Wheelchair Mobility    Modified Rankin (Stroke Patients Only)       Balance Overall balance assessment: Needs assistance           Standing balance-Leahy Scale: Fair                               Pertinent Vitals/Pain Pain Assessment: No/denies pain    Home Living                         Prior Function                 Hand Dominance        Extremity/Trunk Assessment   Upper Extremity Assessment Upper Extremity Assessment: Overall WFL for tasks assessed    Lower Extremity Assessment Lower Extremity Assessment: Generalized weakness    Cervical / Trunk Assessment Cervical / Trunk Assessment: Normal  Communication      Cognition Arousal/Alertness: Awake/alert Behavior During Therapy: WFL for tasks assessed/performed Overall Cognitive Status: Within Functional Limits for tasks assessed                                        General Comments      Exercises     Assessment/Plan    PT Assessment Patient needs continued PT services  PT Problem List Decreased mobility;Decreased activity tolerance;Decreased balance       PT Treatment Interventions DME instruction;Gait training;Therapeutic exercise;Functional mobility training;Therapeutic activities;Patient/family education    PT Goals (Current goals can be found in the Care Plan section)  Acute Rehab PT Goals Patient Stated Goal: Regain IND PT Goal Formulation: With patient Time For Goal Achievement: 01/11/21 Potential to Achieve Goals: Good    Frequency Min  3X/week   Barriers to discharge        Co-evaluation               AM-PAC PT "6 Clicks" Mobility  Outcome Measure Help needed turning from your back to your side while in a flat bed without using bedrails?: A Little Help needed moving from lying on your back to sitting on the side of a flat bed without using bedrails?: A Little Help needed moving to and from a bed to a chair (including a wheelchair)?: A Little Help needed standing up from a chair using your arms (e.g., wheelchair or bedside chair)?: A Little Help needed to walk in hospital room?: A Little Help needed climbing 3-5 steps with a railing? : A Little 6 Click Score: 18    End of Session Equipment Utilized During Treatment: Gait  belt Activity Tolerance: Patient tolerated treatment well Patient left: in bed;with call bell/phone within reach        Time: 1027-1041 PT Time Calculation (min) (ACUTE ONLY): 14 min   Charges:   PT Evaluation $PT Eval Moderate Complexity: [place], PT Acute Rehabilitation  Office: [phone] Pager: [phone]

## 2020-12-28 NOTE — H&P (Signed)
History and Physical  Patient Name: Diana Alvarado     DDU:[id number]    DOB: 06-27-1956    DOA: 12/27/2020 PCP: [facility], [facility]  Patient coming from: Home --> [facility]  Chief Complaint: Dyspnea, cough      HPI: Diana Alvarado is a 65 y.o. F with hx asthma/COPD, smoking, chronic pain on daily opiates, ADD, recent hip surgery and depression who presents with few days cough and shortness of breath.  The patient was in her usual state of health until several days ago when she started to develop cough, runny nose, wheezing.  She was started on doxycycline, but her conditions worsened and she became short of breath so she came to the ER.  Her granddaughter in daycare has frequent URI symptoms, but no specific sick contacts.  In the ER, the patient was afebrile, SPO2 90% on room air, WBC 15 K.   -Chest x-ray clear, because she had a recent hip surgery, CT angiogram was obtained to rule out PE, this was negative for PE but showed some patchy perihilar opacities, mild -she was started on antibiotics and admitted to the hospitalist service for suspected pneumonia.   -COVID and flu testing were negative          ROS: Review of Systems  Constitutional: Negative for chills, fever and malaise/fatigue.  HENT: Positive for congestion. Negative for sore throat.   Respiratory: Positive for cough, shortness of breath and wheezing. Negative for hemoptysis and sputum production.   Cardiovascular: Negative for orthopnea and leg swelling.  All other systems reviewed and are negative.         Past Medical History:  Diagnosis Date  . ADHD   . Anxiety 11/17/2011  . Asthma 04/19/2011  . Chronic LBP 04/19/2011  . Depression 04/19/2011  . DJD (degenerative joint disease), cervical 04/19/2011  . Emphysema, unspecified ([place])   . Lumbar disc disease 04/19/2011  . Pulmonary nodule   . Splenic artery aneurysm ([place])   . UTI (lower urinary tract infection)     Past Surgical History:   Procedure Laterality Date  . ABDOMINAL HYSTERECTOMY  1997  . APPENDECTOMY    . CHOLECYSTECTOMY    . gall bladder  2005  . NECK SURGERY  1995  . ruptured disc  1992   repaired ruptured disc, neck  . TOTAL HIP ARTHROPLASTY Right 11/25/2020   Procedure: TOTAL HIP ARTHROPLASTY ANTERIOR APPROACH;  Surgeon: Rod Can, MD;  Location: [facility];  Service: Orthopedics;  Laterality: Right;    Social History: Patient lives at home.  She is a retired Marine scientist.  The patient walks unassisted.  Still smoker, trying to quit, committed.  Allergies  Allergen Reactions  . Aspirin Hives  . Sulfonamide Derivatives Hives    Family history: family history includes Arthritis in an other family member; Cancer in an other family member; Colon cancer in her maternal aunt; Diabetes in an other family member.  Prior to Admission medications   Medication Sig Start Date End Date Taking? Authorizing Provider  acetaminophen (TYLENOL) 500 MG tablet Take 500 mg by mouth every 6 (six) hours as needed for mild pain or moderate pain.   Yes [provider]  albuterol (PROVENTIL HFA;VENTOLIN HFA) 108 (90 BASE) MCG/ACT inhaler Inhale 2 puffs into the lungs every 6 (six) hours as needed for wheezing or shortness of breath. 06/05/12  Yes Hommel, Sean, DO  ALPRAZolam (XANAX) 0.5 MG tablet TAKE 1/2 TO 1 TABLET BY MOUTH TWICE DAILY AS NEEDED FOR ANXIETY(MUST LAST  30 DAYS) 12/03/20  Yes Hurst, Helene Kelp T, PA-C  amphetamine-dextroamphetamine (ADDERALL) 30 MG tablet Take 1 tablet by mouth 2 (two) times daily. 12/15/20  Yes Hurst, Helene Kelp T, PA-C  ARIPiprazole (ABILIFY) 10 MG tablet TAKE 1 TABLET(10 MG) BY MOUTH DAILY Patient taking differently: Take 10 mg by mouth daily. 08/11/20  Yes Donnal Moat T, PA-C  docusate sodium (COLACE) 100 MG capsule Take 1 capsule (100 mg total) by mouth 2 (two) times daily. 11/26/20  Yes Cherlynn June B, PA  DULoxetine (CYMBALTA) 60 MG capsule TAKE 1 CAPSULE(60 MG) BY MOUTH DAILY 11/10/20  Yes Hurst,  Teresa T, PA-C  fluticasone furoate-vilanterol (BREO ELLIPTA) 100-25 MCG/INH AEPB Inhale 1 puff into the lungs daily at 12 noon.   Yes [provider]  hydrOXYzine (ATARAX/VISTARIL) 10 MG tablet TAKE 1 TO 3 TABLETS(10 TO 30 MG) BY MOUTH THREE TIMES DAILY AS NEEDED FOR ANXIETY Patient taking differently: Take 10 mg by mouth every 6 (six) hours as needed for anxiety. 10/28/20  Yes Hurst, Dorothea Glassman, PA-C  NARCAN 4 MG/0.1ML LIQD nasal spray kit Place 4 mg into the nose once. 06/17/20  Yes [provider]  ondansetron (ZOFRAN) 4 MG tablet Take 1 tablet (4 mg total) by mouth every 6 (six) hours as needed for nausea. 11/26/20  Yes Dorothyann Peng, PA  senna (SENOKOT) 8.6 MG TABS tablet Take 1 tablet (8.6 mg total) by mouth 2 (two) times daily. 11/26/20  Yes Cherlynn June B, PA  varenicline (CHANTIX) 0.5 MG tablet 1 p.o. daily for 2 days, then 1 p.o. twice daily until gone.  Then she will take the 1 mg prescription Patient taking differently: Take 0.5 mg by mouth See admin instructions. 1 p.o. daily for 2 days, then 1 p.o. twice daily until gone.  Then she will take the 1 mg prescription 10/28/20  Yes Hurst, Teresa T, PA-C  amphetamine-dextroamphetamine (ADDERALL) 30 MG tablet Take 1 tablet by mouth 2 (two) times daily. 01/13/21   Donnal Moat T, PA-C  amphetamine-dextroamphetamine (ADDERALL) 30 MG tablet Take 1 tablet by mouth 2 (two) times daily. Patient not taking: Reported on 12/28/2020 11/15/20   Donnal Moat T, PA-C  apixaban (ELIQUIS) 2.5 MG TABS tablet Take 1 tablet (2.5 mg total) by mouth every 12 (twelve) hours. 11/26/20 12/26/20  Cherlynn June B, PA  varenicline (CHANTIX) 1 MG tablet TAKE 1 TABLET BY MOUTH TWICE DAILY. START AFTER COMPLETING THE TWO WEEKS OF 0.5MG 12/01/20   Addison Lank, PA-C       Physical Exam: BP 102/60 (BP Location: Right Arm)   Pulse 81   Temp 97.7 F (36.5 C) (Oral)   Resp 20   Ht _0  (1.549 m)   Wt 70.6 kg   SpO2 100%   BMI 29.41 kg/m   General appearance: Well-developed, adult female, alert and in no acute distress, appears tired.   Eyes: Anicteric, conjunctiva pink, lids and lashes normal. PERRL.    ENT: No nasal deformity, discharge, epistaxis.  Hearing normal. OP moist without lesions.  Dentition in reasonable repair, lips normal. Neck: No neck masses.  Trachea midline.  No thyromegaly/tenderness. Lymph: No cervical or supraclavicular lymphadenopathy. Skin: Warm and dry.  No jaundice.  No suspicious rashes or lesions. Cardiac: RRR, nl S1-S2, no murmurs appreciated.  Capillary refill is brisk.  JVP normal.  No LE edema.  Radial pulses 2+ and symmetric. Respiratory: Normal respiratory rate and rhythm.  Expiratory wheezing bilaterally. Abdomen: Abdomen soft.  No TTP. No ascites, distension, hepatosplenomegaly.  MSK: No deformities or effusions of the large joints of the upper or lower extremities bilaterally.  No cyanosis or clubbing. Neuro: Cranial nerves normal.  Sensation intact to light touch. Speech is fluent.  Muscle strength normal.    Psych: Sensorium intact and responding to questions, attention normal.  Behavior appropriate.  Affect normal.  Judgment and insight appear normal.   Portable ultrasound was used to evaluate: Dyspnea/hypoxia A lines noted in all posterior lung fields.    Labs on Admission:  I have personally reviewed following labs and imaging studies: CBC: Recent Labs  Lab 12/27/20 2115  WBC 15.0*  NEUTROABS 12.9*  HGB 10.3*  HCT 32.0*  MCV 92.5  PLT 244   Basic Metabolic Panel: Recent Labs  Lab 12/27/20 2115  NA 141  K 4.1  CL 106  CO2 28  GLUCOSE 119*  BUN 22  CREATININE 0.77  CALCIUM 9.2   GFR: Estimated Creatinine Clearance: 64.7 mL/min (by C-G formula based on SCr of 0.77 mg/dL).    Recent Results (from the past 240 hour(s))  Resp Panel by RT-PCR (Flu A&B, Covid) Nasopharyngeal Swab     Status: None   Collection Time: 12/27/20  9:15 PM   Specimen: Nasopharyngeal Swab;  Nasopharyngeal(NP) swabs in vial transport medium  Result Value Ref Range Status   SARS Coronavirus 2 by RT PCR NEGATIVE NEGATIVE Final    Comment: (NOTE) SARS-CoV-2 target nucleic acids are NOT DETECTED.  The SARS-CoV-2 RNA is generally detectable in upper respiratory specimens during the acute phase of infection. The lowest concentration of SARS-CoV-2 viral copies this assay can detect is 138 copies/mL. A negative result does not preclude SARS-Cov-2 infection and should not be used as the sole basis for treatment or other patient management decisions. A negative result may occur with  improper specimen collection/handling, submission of specimen other than nasopharyngeal swab, presence of viral mutation(s) within the areas targeted by this assay, and inadequate number of viral copies(<138 copies/mL). A negative result must be combined with clinical observations, patient history, and epidemiological information. The expected result is Negative.  Fact Sheet for Patients:  EntrepreneurPulse.com.au  Fact Sheet for Healthcare Providers:  IncredibleEmployment.be  This test is no t yet approved or cleared by the [place] FDA and  has been authorized for detection and/or diagnosis of SARS-CoV-2 by FDA under an Emergency Use Authorization (EUA). This EUA will remain  in effect (meaning this test can be used) for the duration of the COVID-19 declaration under Section 564(b)(1) of the Act, 21 U.S.C.section 360bbb-3(b)(1), unless the authorization is terminated  or revoked sooner.       Influenza A by PCR NEGATIVE NEGATIVE Final   Influenza B by PCR NEGATIVE NEGATIVE Final    Comment: (NOTE) The Xpert Xpress SARS-CoV-2/FLU/RSV plus assay is intended as an aid in the diagnosis of influenza from Nasopharyngeal swab specimens and should not be used as a sole basis for treatment. Nasal washings and aspirates are unacceptable for Xpert Xpress  SARS-CoV-2/FLU/RSV testing.  Fact Sheet for Patients: EntrepreneurPulse.com.au  Fact Sheet for Healthcare Providers: IncredibleEmployment.be  This test is not yet approved or cleared by the [place] FDA and has been authorized for detection and/or diagnosis of SARS-CoV-2 by FDA under an Emergency Use Authorization (EUA). This EUA will remain in effect (meaning this test can be used) for the duration of the COVID-19 declaration under Section 564(b)(1) of the Act, 21 U.S.C. section 360bbb-3(b)(1), unless the authorization is terminated or revoked.  Performed at [facility]            Radiological Exams on Admission: Personally reviewed CXR shows no opacities, CTA chest report reviewed, no PE, mild opacities, very mild: DG Chest 2 View  Result Date: 12/27/2020 CLINICAL DATA:  Congestion, cough, shortness of breath for 2 days. EXAM: CHEST - 2 VIEW COMPARISON:  None. FINDINGS: The heart size and mediastinal contours are within normal limits. Both lungs are clear. The visualized skeletal structures are unremarkable. IMPRESSION: No active cardiopulmonary disease. Electronically Signed   By: Lucienne Capers M.D.   On: 12/27/2020 19:32   CT Angio Chest PE W and/or Wo Contrast  Result Date: 12/27/2020 CLINICAL DATA:  Pulmonary embolus suspected with high probability. Recent surgery. Shortness of breath and hypoxia. EXAM: CT ANGIOGRAPHY CHEST WITH CONTRAST TECHNIQUE: Multidetector CT imaging of the chest was performed using the standard protocol during bolus administration of intravenous contrast. Multiplanar CT image reconstructions and MIPs were obtained to evaluate the vascular anatomy. CONTRAST:  17m OMNIPAQUE IOHEXOL 350 MG/ML SOLN COMPARISON:  Chest radiograph 12/27/2020 FINDINGS: Cardiovascular: There is good opacification of the central and segmental pulmonary arteries. No focal filling defects. No evidence of significant  pulmonary embolus. Normal heart size. No pericardial effusions. Normal caliber thoracic aorta. No aortic dissection. Great vessel origins are patent. Mediastinum/Nodes: Esophagus is decompressed. Scattered mediastinal lymph nodes are not pathologically enlarged. Thyroid gland is unremarkable. Lungs/Pleura: Patchy focal areas of airspace disease scattered throughout both lungs, most prominent in the perihilar and basilar regions. Changes may represent multifocal pneumonia. No pleural effusions. No pneumothorax. Upper Abdomen: No acute abnormalities demonstrated in the visualized upper abdomen. Gallbladder is surgically absent. Calcified splenic artery aneurysm measuring 1.4 cm diameter. Musculoskeletal: Degenerative changes in the spine. No destructive bone lesions. Review of the MIP images confirms the above findings. IMPRESSION: 1. No evidence of significant pulmonary embolus. 2. Patchy focal areas of airspace disease scattered throughout both lungs, most prominent in the perihilar and basilar regions. Changes may represent multifocal pneumonia. 3. Calcified splenic artery aneurysm measuring 1.4 cm diameter. Electronically Signed   By: WLucienne CapersM.D.   On: 12/27/2020 22:19    EKG: Independently reviewed. Rate 92, QTc normal, sinus, no ST changes.       Assessment/Plan   Asthma/COPD with acute exacerbation Multifocal pneumonia Patient presents with wheezing, cough.  Acute hypoxic respiratory failure ruled out, SPO2 this morning is 92% on room. - Continue steroids - Continue azithromycin and Rocephin - Continue scheduled and as needed bronchodilators - Check legionella given age, smoking  Anemia due to blood loss, postoperative, resolving Hemoglobin 7.6 postoperatively, she had a mild hematoma in her hip replacement site last month, this appears to have resolved, she is feels it is better.  Chronic pain -Continue home oxycodone 10 mg 5 times daily  Anxiety ADD Depression -Continue  Abilify, duloxetine - Hold Adderall in the hospital  Smoking -Continue home VHyman Bible- Nicotine patch while in hospital  CKD ruled out There is a chart history of chronic kidney disease, her renal function is normal.     DVT prophylaxis: Lovenox Code Status: Full code Family Communication: None present Disposition Plan: Anticipate IV steroids and antibiotics and bronchodilators overnight, if improving, likely home tomorrow Consults called: None Admission status: Observation   At the point of initial evaluation, it is my clinical opinion that admission for OBSERVATION is reasonable and necessary because the patient's presenting complaints in the context of their chronic conditions represent sufficient risk of deterioration or significant morbidity to  constitute reasonable grounds for close observation in the hospital setting, but that the patient may be medically stable for discharge from the hospital within 24 to 48 hours.    Medical decision making: Patient seen at 7:38 AM on 12/28/2020.  What exists of the patient's chart was reviewed in depth and summarized above.  Clinical condition: stable.        [facility] Please page though [place] or Epic secure chat:  For password, contact charge nurse

## 2020-12-29 DIAGNOSIS — J45901 Unspecified asthma with (acute) exacerbation: Secondary | ICD-10-CM

## 2020-12-29 DIAGNOSIS — G894 Chronic pain syndrome: Secondary | ICD-10-CM

## 2020-12-29 DIAGNOSIS — F909 Attention-deficit hyperactivity disorder, unspecified type: Secondary | ICD-10-CM

## 2020-12-29 NOTE — Progress Notes (Signed)
PROGRESS NOTE  Diana Alvarado QIH:[id number] DOB: Mar 20, 1956 DOA: 12/27/2020 PCP: [facility], [facility]  HPI/Recap of past 24 hours: HPI from Dr Loleta Books Diana Alvarado is a 65 y.o. F with hx asthma/COPD, smoking, chronic pain on daily opiates, ADD, recent hip surgery and depression who presents with few days cough and shortness of breath. The patient was in her usual state of health until several days ago when she started to develop cough, runny nose, wheezing.  She was started on doxycycline, but her conditions worsened and she became short of breath so she came to the ER.  Her granddaughter in daycare has frequent URI symptoms, but no specific sick contacts. In the ER, the patient was afebrile, SPO2 90% on room air, WBC 15 K. Chest x-ray clear, because she had a recent hip surgery, CTA chest was negative for PE but showed some patchy perihilar opacities, mild. Pt was started on antibiotics and admitted to the hospitalist service for suspected pneumonia.  COVID and flu testing were negative.    Today, patient still noted to be wheezing significantly, denies any worsening shortness of breath, chest pain, abdominal pain, nausea/vomiting, fever/chills.   Assessment/Plan: Principal Problem:   Pneumonia of both lungs due to infectious organism Active Problems:   Major depression   Attention deficit disorder   Personal history of drug dependence ([place])   Splenic artery aneurysm ([facility])   Chronic pain syndrome   Asthma, chronic, unspecified asthma severity, with acute exacerbation   Acute exacerbation of asthma/COPD Multifocal pneumonia/CAP Currently on 2 L of O2, saturating around 96%, RN to wean off Currently afebrile with leukocytosis (on steroids) CTA chest with patchy perihilar opacities Continue azithromycin, Rocephin Continue steroids, duo nebs, inhaler  Postop anemia due to blood loss Improving Daily CBC  Chronic pain Continue home  oxycodone  Anxiety ADD Depression Continue Abilify, duloxetine Hold Adderall  Tobacco abuse Advised to quit Nicotine patch, continue Chantix     Estimated body mass index is 29.41 kg/m as calculated from the following:   Height as of this encounter: 5\' 1"  (1.549 m).   Weight as of this encounter: 70.6 kg.     Code Status: Full  Family Communication: None at bedside  Disposition Plan: Status is: Inpatient  Remains inpatient appropriate because:Inpatient level of care appropriate due to severity of illness   Dispo: The patient is from: Home              Anticipated d/c is to: Home              Patient currently is not medically stable to d/c.   Difficult to place patient No   Consultants:  None  Procedures:  None  Antimicrobials:  Azithromycin  Rocephin  DVT prophylaxis: Lovenox   Objective: Vitals:   12/28/20 2009 12/28/20 2055 12/29/20 0141 12/29/20 0607  BP:  102/61  (!) 103/51  Pulse:  79  (!) 55  Resp:  20  20  Temp:  98 F (36.7 C)  98 F (36.7 C)  TempSrc:  Oral  Oral  SpO2: 93% 100% 95% 96%  Weight:      Height:        Intake/Output Summary (Last 24 hours) at 12/29/2020 1259 Last data filed at 12/29/2020 1000 Gross per 24 hour  Intake 940 ml  Output 1350 ml  Net -410 ml   Filed Weights   12/27/20 1849  Weight: 70.6 kg    Exam:  General: NAD   Cardiovascular: S1, S2  present  Respiratory:  Bilateral wheezing noted, with diminished breath sounds  Abdomen: Soft, nontender, nondistended, bowel sounds present  Musculoskeletal: No bilateral pedal edema noted  Skin: Normal  Psychiatry: Normal mood    Data Reviewed: CBC: Recent Labs  Lab 12/27/20 2115  WBC 15.0*  NEUTROABS 12.9*  HGB 10.3*  HCT 32.0*  MCV 92.5  PLT 595   Basic Metabolic Panel: Recent Labs  Lab 12/27/20 2115  NA 141  K 4.1  CL 106  CO2 28  GLUCOSE 119*  BUN 22  CREATININE 0.77  CALCIUM 9.2   GFR: Estimated Creatinine Clearance: 64.7  mL/min (by C-G formula based on SCr of 0.77 mg/dL). Liver Function Tests: No results for input(s): AST, ALT, ALKPHOS, BILITOT, PROT, ALBUMIN in the last 168 hours. No results for input(s): LIPASE, AMYLASE in the last 168 hours. No results for input(s): AMMONIA in the last 168 hours. Coagulation Profile: No results for input(s): INR, PROTIME in the last 168 hours. Cardiac Enzymes: No results for input(s): CKTOTAL, CKMB, CKMBINDEX, TROPONINI in the last 168 hours. BNP (last 3 results) No results for input(s): PROBNP in the last 8760 hours. HbA1C: No results for input(s): HGBA1C in the last 72 hours. CBG: No results for input(s): GLUCAP in the last 168 hours. Lipid Profile: No results for input(s): CHOL, HDL, LDLCALC, TRIG, CHOLHDL, LDLDIRECT in the last 72 hours. Thyroid Function Tests: No results for input(s): TSH, T4TOTAL, FREET4, T3FREE, THYROIDAB in the last 72 hours. Anemia Panel: No results for input(s): VITAMINB12, FOLATE, FERRITIN, TIBC, IRON, RETICCTPCT in the last 72 hours. Urine analysis:    Component Value Date/Time   COLORURINE YELLOW 11/17/2020 1159   APPEARANCEUR CLEAR 11/17/2020 1159   LABSPEC 1.011 11/17/2020 1159   PHURINE 5.0 11/17/2020 1159   GLUCOSEU NEGATIVE 11/17/2020 1159   GLUCOSEU NEGATIVE 04/19/2012 1402   HGBUR NEGATIVE 11/17/2020 1159   BILIRUBINUR NEGATIVE 11/17/2020 1159   BILIRUBINUR negative 11/17/2011 1525   KETONESUR NEGATIVE 11/17/2020 1159   PROTEINUR NEGATIVE 11/17/2020 1159   UROBILINOGEN 0.2 04/19/2012 1402   NITRITE NEGATIVE 11/17/2020 1159   LEUKOCYTESUR NEGATIVE 11/17/2020 1159   Sepsis Labs: @LABRCNTIP (procalcitonin:4,lacticidven:4)  ) Recent Results (from the past 240 hour(s))  Resp Panel by RT-PCR (Flu A&B, Covid) Nasopharyngeal Swab     Status: None   Collection Time: 12/27/20  9:15 PM   Specimen: Nasopharyngeal Swab; Nasopharyngeal(NP) swabs in vial transport medium  Result Value Ref Range Status   SARS Coronavirus 2 by RT  PCR NEGATIVE NEGATIVE Final    Comment: (NOTE) SARS-CoV-2 target nucleic acids are NOT DETECTED.  The SARS-CoV-2 RNA is generally detectable in upper respiratory specimens during the acute phase of infection. The lowest concentration of SARS-CoV-2 viral copies this assay can detect is 138 copies/mL. A negative result does not preclude SARS-Cov-2 infection and should not be used as the sole basis for treatment or other patient management decisions. A negative result may occur with  improper specimen collection/handling, submission of specimen other than nasopharyngeal swab, presence of viral mutation(s) within the areas targeted by this assay, and inadequate number of viral copies(<138 copies/mL). A negative result must be combined with clinical observations, patient history, and epidemiological information. The expected result is Negative.  Fact Sheet for Patients:  EntrepreneurPulse.com.au  Fact Sheet for Healthcare Providers:  IncredibleEmployment.be  This test is no t yet approved or cleared by the [place] FDA and  has been authorized for detection and/or diagnosis of SARS-CoV-2 by FDA under an Emergency Use Authorization (EUA). This  EUA will remain  in effect (meaning this test can be used) for the duration of the COVID-19 declaration under Section 564(b)(1) of the Act, 21 U.S.C.section 360bbb-3(b)(1), unless the authorization is terminated  or revoked sooner.       Influenza A by PCR NEGATIVE NEGATIVE Final   Influenza B by PCR NEGATIVE NEGATIVE Final    Comment: (NOTE) The Xpert Xpress SARS-CoV-2/FLU/RSV plus assay is intended as an aid in the diagnosis of influenza from Nasopharyngeal swab specimens and should not be used as a sole basis for treatment. Nasal washings and aspirates are unacceptable for Xpert Xpress SARS-CoV-2/FLU/RSV testing.  Fact Sheet for Patients: EntrepreneurPulse.com.au  Fact Sheet for  Healthcare Providers: IncredibleEmployment.be  This test is not yet approved or cleared by the [place] FDA and has been authorized for detection and/or diagnosis of SARS-CoV-2 by FDA under an Emergency Use Authorization (EUA). This EUA will remain in effect (meaning this test can be used) for the duration of the COVID-19 declaration under Section 564(b)(1) of the Act, 21 U.S.C. section 360bbb-3(b)(1), unless the authorization is terminated or revoked.  Performed at [facility]       Studies: No results found.  Scheduled Meds: . ARIPiprazole  10 mg Oral Daily  . azithromycin  500 mg Oral Daily  . docusate sodium  100 mg Oral BID  . DULoxetine  60 mg Oral Daily  . enoxaparin (LOVENOX) injection  40 mg Subcutaneous Q24H  . ipratropium-albuterol  3 mL Nebulization Q6H  . nicotine  14 mg Transdermal Daily  . predniSONE  40 mg Oral Q breakfast  . senna  1 tablet Oral BID  . varenicline  0.5 mg Oral Daily    Continuous Infusions: . cefTRIAXone (ROCEPHIN)  IV 2 g (12/28/20 2300)     LOS: 1 day     Alma Friendly, MD [facility]  If 7PM-7AM, please contact night-coverage [email or web address] 12/29/2020, 12:59 PM

## 2020-12-30 LAB — CBC WITH DIFFERENTIAL/PLATELET
Abs Immature Granulocytes: 0.07 10*3/uL (ref 0.00–0.07)
Basophils Absolute: 0 10*3/uL (ref 0.0–0.1)
Basophils Relative: 0 %
Eosinophils Absolute: 0 10*3/uL (ref 0.0–0.5)
Eosinophils Relative: 0 %
HCT: 32.4 % — ABNORMAL LOW (ref 36.0–46.0)
Hemoglobin: 10.1 g/dL — ABNORMAL LOW (ref 12.0–15.0)
Immature Granulocytes: 1 %
Lymphocytes Relative: 30 %
Lymphs Abs: 3.5 10*3/uL (ref 0.7–4.0)
MCH: 29.3 pg (ref 26.0–34.0)
MCHC: 31.2 g/dL (ref 30.0–36.0)
MCV: 93.9 fL (ref 80.0–100.0)
Monocytes Absolute: 0.7 10*3/uL (ref 0.1–1.0)
Monocytes Relative: 6 %
Neutro Abs: 7.4 10*3/uL (ref 1.7–7.7)
Neutrophils Relative %: 63 %
Platelets: 260 10*3/uL (ref 150–400)
RBC: 3.45 MIL/uL — ABNORMAL LOW (ref 3.87–5.11)
RDW: 13.6 % (ref 11.5–15.5)
WBC: 11.6 10*3/uL — ABNORMAL HIGH (ref 4.0–10.5)
nRBC: 0 % (ref 0.0–0.2)

## 2020-12-30 LAB — BASIC METABOLIC PANEL
Anion gap: 9 (ref 5–15)
BUN: 27 mg/dL — ABNORMAL HIGH (ref 8–23)
CO2: 26 mmol/L (ref 22–32)
Calcium: 8.7 mg/dL — ABNORMAL LOW (ref 8.9–10.3)
Chloride: 111 mmol/L (ref 98–111)
Creatinine, Ser: 0.77 mg/dL (ref 0.44–1.00)
GFR, Estimated: >60 mL/min (ref >60)
Glucose, Bld: 93 mg/dL (ref 70–99)
Potassium: 3.5 mmol/L (ref 3.5–5.1)
Sodium: 146 mmol/L — ABNORMAL HIGH (ref 135–145)

## 2020-12-30 MED ORDER — IPRATROPIUM-ALBUTEROL 0.5-2.5 (3) MG/3ML IN SOLN
3.0000 mL | Freq: Three times a day (TID) | RESPIRATORY_TRACT | Status: DC
  Administered 2020-12-31 (×2): 3 mL via RESPIRATORY_TRACT
  Filled 2020-12-30 (×2): qty 3

## 2020-12-30 NOTE — Progress Notes (Signed)
PROGRESS NOTE  Diana Alvarado XVQ:[id number] DOB: 1956-08-18 DOA: 12/27/2020 PCP: Medicine, [facility]  HPI/Recap of past 24 hours: HPI from Dr Diana Alvarado Diana Alvarado is a 65 y.o. F with hx asthma/COPD, smoking, chronic pain on daily opiates, ADD, recent hip surgery and depression who presents with few days cough and shortness of breath. The patient was in her usual state of health until several days ago when she started to develop cough, runny nose, wheezing.  She was started on doxycycline, but her conditions worsened and she became short of breath so she came to the ER.  Her granddaughter in daycare has frequent URI symptoms, but no specific sick contacts. In the ER, the patient was afebrile, SPO2 90% on room air, WBC 15 K. Chest x-ray clear, because she had a recent hip surgery, CTA chest was negative for PE but showed some patchy perihilar opacities, mild. Pt was started on antibiotics and admitted to the hospitalist service for suspected pneumonia.  COVID and flu testing were negative.    Today, patient still noted to be wheezing, slow to improve.  Reported feeling short of breath after a bath in the bathroom.  Denied any chest pain, abdominal pain, nausea/vomiting, fever/chills.  Still coughing.    Assessment/Plan: Principal Problem:   Pneumonia of both lungs due to infectious organism Active Problems:   Major depression   Attention deficit disorder   Personal history of drug dependence ([place])   Splenic artery aneurysm (HCC)   Chronic pain syndrome   Asthma, chronic, unspecified asthma severity, with acute exacerbation   Acute exacerbation of asthma/COPD Multifocal pneumonia/CAP Weaned off O2, currently on RA sat around 94% Currently afebrile with leukocytosis (on steroids) CTA chest with patchy perihilar opacities Continue azithromycin, Rocephin Continue steroids, duo nebs, inhaler  Postop anemia due to blood loss Improving Daily CBC  Chronic pain Continue  home oxycodone  Anxiety ADD Depression Continue Abilify, duloxetine Hold Adderall  Tobacco abuse Advised to quit Nicotine patch, continue Chantix     Estimated body mass index is 29.41 kg/m as calculated from the following:   Height as of this encounter: 5\' 1"  (1.549 m).   Weight as of this encounter: 70.6 kg.     Code Status: Full  Family Communication: None at bedside  Disposition Plan: Status is: Inpatient  Remains inpatient appropriate because:Inpatient level of care appropriate due to severity of illness   Dispo: The patient is from: Home              Anticipated d/c is to: Home on 12/31/20              Patient currently is not medically stable to d/c.   Difficult to place patient No   Consultants:  None  Procedures:  None  Antimicrobials:  Azithromycin  Rocephin  DVT prophylaxis: Lovenox   Objective: Vitals:   12/30/20 0213 12/30/20 0509 12/30/20 0810 12/30/20 0822  BP:  128/75    Pulse:  77    Resp:  18    Temp:  (!) 97.5 F (36.4 C)    TempSrc:  Oral    SpO2: 92% 94% 94% 94%  Weight:      Height:        Intake/Output Summary (Last 24 hours) at 12/30/2020 1357 Last data filed at 12/29/2020 1800 Gross per 24 hour  Intake 240 ml  Output 0 ml  Net 240 ml   Filed Weights   12/27/20 1849  Weight: 70.6 kg  Exam:  General: NAD  Cardiovascular: S1, S2 present  Respiratory:  Bilateral wheezing noted, with diminished breath sounds  Abdomen: Soft, nontender, nondistended, bowel sounds present  Musculoskeletal: No bilateral pedal edema noted  Skin: Normal  Psychiatry: Normal mood    Data Reviewed: CBC: Recent Labs  Lab 12/27/20 2115 12/30/20 0439  WBC 15.0* 11.6*  NEUTROABS 12.9* 7.4  HGB 10.3* 10.1*  HCT 32.0* 32.4*  MCV 92.5 93.9  PLT 236 578   Basic Metabolic Panel: Recent Labs  Lab 12/27/20 2115 12/30/20 0439  NA 141 146*  K 4.1 3.5  CL 106 111  CO2 28 26  GLUCOSE 119* 93  BUN 22 27*  CREATININE  0.77 0.77  CALCIUM 9.2 8.7*   GFR: Estimated Creatinine Clearance: 64.7 mL/min (by C-G formula based on SCr of 0.77 mg/dL). Liver Function Tests: No results for input(s): AST, ALT, ALKPHOS, BILITOT, PROT, ALBUMIN in the last 168 hours. No results for input(s): LIPASE, AMYLASE in the last 168 hours. No results for input(s): AMMONIA in the last 168 hours. Coagulation Profile: No results for input(s): INR, PROTIME in the last 168 hours. Cardiac Enzymes: No results for input(s): CKTOTAL, CKMB, CKMBINDEX, TROPONINI in the last 168 hours. BNP (last 3 results) No results for input(s): PROBNP in the last 8760 hours. HbA1C: No results for input(s): HGBA1C in the last 72 hours. CBG: No results for input(s): GLUCAP in the last 168 hours. Lipid Profile: No results for input(s): CHOL, HDL, LDLCALC, TRIG, CHOLHDL, LDLDIRECT in the last 72 hours. Thyroid Function Tests: No results for input(s): TSH, T4TOTAL, FREET4, T3FREE, THYROIDAB in the last 72 hours. Anemia Panel: No results for input(s): VITAMINB12, FOLATE, FERRITIN, TIBC, IRON, RETICCTPCT in the last 72 hours. Urine analysis:    Component Value Date/Time   COLORURINE YELLOW 11/17/2020 1159   APPEARANCEUR CLEAR 11/17/2020 1159   LABSPEC 1.011 11/17/2020 1159   PHURINE 5.0 11/17/2020 1159   GLUCOSEU NEGATIVE 11/17/2020 1159   GLUCOSEU NEGATIVE 04/19/2012 1402   HGBUR NEGATIVE 11/17/2020 1159   BILIRUBINUR NEGATIVE 11/17/2020 1159   BILIRUBINUR negative 11/17/2011 1525   KETONESUR NEGATIVE 11/17/2020 1159   PROTEINUR NEGATIVE 11/17/2020 1159   UROBILINOGEN 0.2 04/19/2012 1402   NITRITE NEGATIVE 11/17/2020 1159   LEUKOCYTESUR NEGATIVE 11/17/2020 1159   Sepsis Labs: @LABRCNTIP (procalcitonin:4,lacticidven:4)  ) Recent Results (from the past 240 hour(s))  Resp Panel by RT-PCR (Flu A&B, Covid) Nasopharyngeal Swab     Status: None   Collection Time: 12/27/20  9:15 PM   Specimen: Nasopharyngeal Swab; Nasopharyngeal(NP) swabs in vial  transport medium  Result Value Ref Range Status   SARS Coronavirus 2 by RT PCR NEGATIVE NEGATIVE Final    Comment: (NOTE) SARS-CoV-2 target nucleic acids are NOT DETECTED.  The SARS-CoV-2 RNA is generally detectable in upper respiratory specimens during the acute phase of infection. The lowest concentration of SARS-CoV-2 viral copies this assay can detect is 138 copies/mL. A negative result does not preclude SARS-Cov-2 infection and should not be used as the sole basis for treatment or other patient management decisions. A negative result may occur with  improper specimen collection/handling, submission of specimen other than nasopharyngeal swab, presence of viral mutation(s) within the areas targeted by this assay, and inadequate number of viral copies(<138 copies/mL). A negative result must be combined with clinical observations, patient history, and epidemiological information. The expected result is Negative.  Fact Sheet for Patients:  EntrepreneurPulse.com.au  Fact Sheet for Healthcare Providers:  IncredibleEmployment.be  This test is no t yet approved or  cleared by the [place] and  has been authorized for detection and/or diagnosis of SARS-CoV-2 by FDA under an Emergency Use Authorization (EUA). This EUA will remain  in effect (meaning this test can be used) for the duration of the COVID-19 declaration under Section 564(b)(1) of the Act, 21 U.S.C.section 360bbb-3(b)(1), unless the authorization is terminated  or revoked sooner.       Influenza A by PCR NEGATIVE NEGATIVE Final   Influenza B by PCR NEGATIVE NEGATIVE Final    Comment: (NOTE) The Xpert Xpress SARS-CoV-2/FLU/RSV plus assay is intended as an aid in the diagnosis of influenza from Nasopharyngeal swab specimens and should not be used as a sole basis for treatment. Nasal washings and aspirates are unacceptable for Xpert Xpress SARS-CoV-2/FLU/RSV testing.  Fact  Sheet for Patients: EntrepreneurPulse.com.au  Fact Sheet for Healthcare Providers: IncredibleEmployment.be  This test is not yet approved or cleared by the [place] FDA and has been authorized for detection and/or diagnosis of SARS-CoV-2 by FDA under an Emergency Use Authorization (EUA). This EUA will remain in effect (meaning this test can be used) for the duration of the COVID-19 declaration under Section 564(b)(1) of the Act, 21 U.S.C. section 360bbb-3(b)(1), unless the authorization is terminated or revoked.  Performed at [facility]       Studies: No results found.  Scheduled Meds: . ARIPiprazole  10 mg Oral Daily  . azithromycin  500 mg Oral Daily  . docusate sodium  100 mg Oral BID  . DULoxetine  60 mg Oral Daily  . enoxaparin (LOVENOX) injection  40 mg Subcutaneous Q24H  . ipratropium-albuterol  3 mL Nebulization Q6H  . nicotine  14 mg Transdermal Daily  . predniSONE  40 mg Oral Q breakfast  . senna  1 tablet Oral BID  . varenicline  0.5 mg Oral Daily    Continuous Infusions: . cefTRIAXone (ROCEPHIN)  IV 2 g (12/29/20 2339)     LOS: 2 days     Alma Friendly, MD [facility]  If 7PM-7AM, please contact night-coverage [email or web address] 12/30/2020, 1:57 PM

## 2020-12-30 NOTE — Progress Notes (Signed)
Physical Therapy Treatment and Discharge Patient Details Name: Diana Alvarado MRN: [id number] DOB: [birth year] Today's Date: 12/30/2020    History of Present Illness 65 yo female admitted with Pna. Hx of R THA 2022, DJD, DDD, ADHD, cervical fusion, chronic pain    PT Comments    Pt reports not ambulating in hallway since previous PT visit.  Pt mobilizing well and not requiring oxygen at this time.  Pt encouraged to ambulate ad lib, and she was agreeable to no further acute PT needs.  Pt hopeful for d/c home today or soon.  PT to sign off at this time.   Follow Up Recommendations  No PT follow up     Equipment Recommendations  None recommended by PT    Recommendations for Other Services       Precautions / Restrictions Precautions Precautions: Fall Precaution Comments: monitor O2 Restrictions Other Position/Activity Restrictions: WBAT    Mobility  Bed Mobility Overal bed mobility: Modified Independent                  Transfers Overall transfer level: Modified independent                  Ambulation/Gait Ambulation/Gait assistance: Supervision;Modified independent (Device/Increase time)   Assistive device: None Gait Pattern/deviations: WFL(Within Functional Limits)     General Gait Details: monitored SPO2 and remained 93-98% on room air throughout ambulation; no unsteadiness or LOB observed   Stairs             Wheelchair Mobility    Modified Rankin (Stroke Patients Only)       Balance                                            Cognition Arousal/Alertness: Awake/alert Behavior During Therapy: WFL for tasks assessed/performed Overall Cognitive Status: Within Functional Limits for tasks assessed                                        Exercises      General Comments        Pertinent Vitals/Pain Pain Assessment: 0-10 Pain Score: 4  Pain Location: R hip/thigh Pain Descriptors / Indicators:  Sore Pain Intervention(s): Repositioned;Monitored during session    Home Living                      Prior Function            PT Goals (current goals can now be found in the care plan section) Progress towards PT goals: Goals met/education completed, patient discharged from PT    Frequency    Min 3X/week      PT Plan Current plan remains appropriate    Co-evaluation              AM-PAC PT "6 Clicks" Mobility   Outcome Measure  Help needed turning from your back to your side while in a flat bed without using bedrails?: None Help needed moving from lying on your back to sitting on the side of a flat bed without using bedrails?: None Help needed moving to and from a bed to a chair (including a wheelchair)?: None Help needed standing up from a chair using your arms (e.g., wheelchair or bedside chair)?: None Help needed to  walk in hospital room?: None Help needed climbing 3-5 steps with a railing? : A Little 6 Click Score: 23    End of Session   Activity Tolerance: Patient tolerated treatment well Patient left: Other (comment) (pt going to use bathroom in room, mobilizing well) Nurse Communication: Mobility status PT Visit Diagnosis: Difficulty in walking, not elsewhere classified (R26.2)     Time: 1010-1021 PT Time Calculation (min) (ACUTE ONLY): 11 min  Charges:  $Gait Training: 8-22 mins                     Diana Alvarado, DPT Acute Rehabilitation Services Pager: [phone] Office: [phone]   Trena Platt 12/30/2020, 12:41 PM

## 2020-12-31 LAB — BASIC METABOLIC PANEL
Anion gap: 7 (ref 5–15)
BUN: 20 mg/dL (ref 8–23)
CO2: 27 mmol/L (ref 22–32)
Calcium: 8.3 mg/dL — ABNORMAL LOW (ref 8.9–10.3)
Chloride: 107 mmol/L (ref 98–111)
Creatinine, Ser: 0.85 mg/dL (ref 0.44–1.00)
GFR, Estimated: >60 mL/min (ref >60)
Glucose, Bld: 87 mg/dL (ref 70–99)
Potassium: 3.5 mmol/L (ref 3.5–5.1)
Sodium: 141 mmol/L (ref 135–145)

## 2020-12-31 LAB — CBC WITH DIFFERENTIAL/PLATELET
Abs Immature Granulocytes: 0.1 10*3/uL — ABNORMAL HIGH (ref 0.00–0.07)
Basophils Absolute: 0 10*3/uL (ref 0.0–0.1)
Basophils Relative: 0 %
Eosinophils Absolute: 0 10*3/uL (ref 0.0–0.5)
Eosinophils Relative: 0 %
HCT: 30.3 % — ABNORMAL LOW (ref 36.0–46.0)
Hemoglobin: 9.6 g/dL — ABNORMAL LOW (ref 12.0–15.0)
Immature Granulocytes: 1 %
Lymphocytes Relative: 34 %
Lymphs Abs: 3.3 10*3/uL (ref 0.7–4.0)
MCH: 29.2 pg (ref 26.0–34.0)
MCHC: 31.7 g/dL (ref 30.0–36.0)
MCV: 92.1 fL (ref 80.0–100.0)
Monocytes Absolute: 0.8 10*3/uL (ref 0.1–1.0)
Monocytes Relative: 8 %
Neutro Abs: 5.4 10*3/uL (ref 1.7–7.7)
Neutrophils Relative %: 57 %
Platelets: 238 10*3/uL (ref 150–400)
RBC: 3.29 MIL/uL — ABNORMAL LOW (ref 3.87–5.11)
RDW: 13.6 % (ref 11.5–15.5)
WBC: 9.7 10*3/uL (ref 4.0–10.5)
nRBC: 0 % (ref 0.0–0.2)

## 2020-12-31 MED ORDER — IPRATROPIUM-ALBUTEROL 0.5-2.5 (3) MG/3ML IN SOLN
3.0000 mL | Freq: Four times a day (QID) | RESPIRATORY_TRACT | 0 refills | Status: AC | PRN
Start: 2020-12-31 — End: ?

## 2020-12-31 MED ORDER — PREDNISONE 20 MG PO TABS: 40.0000 mg | ORAL_TABLET | Freq: Every day | ORAL | 0 refills | Status: AC

## 2020-12-31 MED ORDER — AZITHROMYCIN 500 MG PO TABS: 500.0000 mg | ORAL_TABLET | Freq: Once | ORAL | 0 refills | Status: AC

## 2020-12-31 MED ORDER — NICOTINE 21 MG/24HR TD PT24: 21.0000 mg | MEDICATED_PATCH | TRANSDERMAL | 0 refills | Status: AC

## 2020-12-31 MED ORDER — CEFDINIR 300 MG PO CAPS: 300.0000 mg | ORAL_CAPSULE | Freq: Two times a day (BID) | ORAL | 0 refills | Status: AC

## 2020-12-31 NOTE — Care Management Important Message (Signed)
Important Message  Patient Details IM Letter given to the Patient. Name: Diana Alvarado MRN: [id number] Date of Birth: 1956/02/15   Medicare Important Message Given:  Yes     Kerin Salen 12/31/2020, 11:23 AM

## 2020-12-31 NOTE — Progress Notes (Signed)
Patient was given discharge instructions, and all questions were answered.  Patient was stable for discharge and was taken to the main exit by wheelchair. 

## 2020-12-31 NOTE — TOC Transition Note (Signed)
Transition of Care [facility]) - CM/SW Discharge Note  Patient Details  Name: Diana Alvarado MRN: [id number] Date of Birth: [birth year]  [facility] CM/SW Contact:  Sherie Don, LCSW Phone Number: 12/31/2020, 11:26 AM  Clinical Narrative: Patient is a 65 year old female who was admitted for pneumonia of both lungs due to infectious organism. Readmission checklist completed due to high readmission score.  CSW met with patient to complete assessment. Per patient, she resides at home with her husband and son. Patient is independent with her ADLs at baseline. Patient is able to afford her medications each month. Patient currently has a rolling walker and 3N1 at home from her previous surgery. Patient's husband provides transportation to medical appointments. No needs identified at this time. TOC signing off.  Final next level of care: Home/Self Care Barriers to Discharge: No Barriers Identified  Patient Goals and CMS Choice Patient states their goals for this hospitalization and ongoing recovery are:: Return home CMS Medicare.gov Compare Post Acute Care list provided to:: Patient Choice offered to / list presented to : NA  Readmission Risk Interventions Readmission Risk Prevention Plan 12/31/2020  Transportation Screening Complete  HRI or Home Care Consult Complete  Social Work Consult for [place] Planning/Counseling Complete  Palliative Care Screening Not Applicable  Medication Review [employer] photographer) Complete  Some recent data might be hidden

## 2020-12-31 NOTE — Discharge Summary (Signed)
Discharge Summary  Diana Alvarado JFH:[id number] DOB: [birth year]  PCP: [facility], [facility]  Admit date: 12/27/2020 Discharge date: 12/31/2020  Time spent: 40 mins   Recommendations for Outpatient Follow-up:  1. PCP in 1 week  Discharge Diagnoses:  Active Hospital Problems   Diagnosis Date Noted  . Pneumonia of both lungs due to infectious organism 12/28/2020  . Asthma, chronic, unspecified asthma severity, with acute exacerbation 12/28/2020  . Chronic pain syndrome 03/26/2018  . Splenic artery aneurysm ([place]) 09/12/2016  . Personal history of drug dependence ([place]) 06/05/2012  . Major depression 03/04/2009  . Attention deficit disorder 03/04/2009    Resolved Hospital Problems  No resolved problems to display.    Discharge Condition: Stable  Diet recommendation: Heart healthy    Vitals:   12/31/20 0802 12/31/20 1253  BP:    Pulse:    Resp:    Temp:    SpO2: 97% 95%    History of present illness:  Diana Alvarado a 65 y.o.Fwith hx asthma/COPD, smoking, chronic pain on daily opiates, ADD, recent hip surgery and depressionwho presents with few days cough and shortness of breath. The patient was in her usual state of health until several days ago when she started to develop cough, runny nose, wheezing. She was started on doxycycline, but her conditions worsened and she became short of breath so she came to the ER. Her granddaughter in daycare has frequent URI symptoms, but no specific sick contacts. In the ER,the patient was afebrile, SPO2 90% on room air, WBC 15 K. Chest x-ray clear, because she had a recent hip surgery, CTA chest was negative for PE but showed some patchy perihilar opacities, mild. Pt was started on antibiotics and admitted to the hospitalist service for suspected pneumonia.COVID and flu testing were negative.     Today, patient reported feeling much better, no significant wheezing noted, denies any worsening shortness of breath,  denies any chest pain, abdominal pain, nausea/vomiting, fever/chills.  Stable to be discharged home with follow-up with PCP in 1 week.  Patient advised again to quit smoking.    Hospital Course:  Principal Problem:   Pneumonia of both lungs due to infectious organism Active Problems:   Major depression   Attention deficit disorder   Personal history of drug dependence ([place])   Splenic artery aneurysm ([facility])   Chronic pain syndrome   Asthma, chronic, unspecified asthma severity, with acute exacerbation   Acute exacerbation of asthma/COPD Multifocal pneumonia/CAP Weaned off O2, currently on RA sat around 94% Currently afebrile with no leukocytosis CTA chest with patchy perihilar opacities S/p azithromycin, Rocephin, discharged on azithromycin and Omnicef to complete 5 days of total antibiotics Continue steroids to complete 5 days Discharged with nebulizer machine to start home duo nebs prn, continue PTA inhaler  Postop anemia due to blood loss Stable Follow-up with PCP  Chronic pain Continue home oxycodone  Anxiety ADD Depression Continue Abilify, duloxetine, Adderall  Tobacco abuse Advised to quit Discharged on nicotine patch, continue Chantix    Estimated body mass index is 29.41 kg/m as calculated from the following:   Height as of this encounter: 5' 1" (1.549 m).   Weight as of this encounter: 70.6 kg.    Procedures:  None  Consultations:  None  Discharge Exam: BP 125/84 (BP Location: Left Arm)   Pulse 72   Temp 97.6 F (36.4 C) (Oral)   Resp 18   Ht 5' 1" (1.549 m)   Wt 70.6 kg   SpO2 95%  BMI 29.41 kg/m   General: NAD Cardiovascular: S1, S2 present Respiratory: Diminished breath sounds bilaterally, no wheezing heard    Discharge Instructions You were cared for by a hospitalist during your hospital stay. If you have any questions about your discharge medications or the care you received while you were in the hospital after you are  discharged, you can call the unit and asked to speak with the hospitalist on call if the hospitalist that took care of you is not available. Once you are discharged, your primary care physician will handle any further medical issues. Please note that NO REFILLS for any discharge medications will be authorized once you are discharged, as it is imperative that you return to your primary care physician (or establish a relationship with a primary care physician if you do not have one) for your aftercare needs so that they can reassess your need for medications and monitor your lab values.  Discharge Instructions    Diet - low sodium heart healthy   Complete by: As directed    Increase activity slowly   Complete by: As directed      Allergies as of 12/31/2020      Reactions   Aspirin Hives   Sulfonamide Derivatives Hives      Medication List    STOP taking these medications   apixaban 2.5 MG Tabs tablet Commonly known as: ELIQUIS     TAKE these medications   acetaminophen 500 MG tablet Commonly known as: TYLENOL Take 500 mg by mouth every 6 (six) hours as needed for mild pain or moderate pain.   albuterol 108 (90 Base) MCG/ACT inhaler Commonly known as: VENTOLIN HFA Inhale 2 puffs into the lungs every 6 (six) hours as needed for wheezing or shortness of breath.   ALPRAZolam 0.5 MG tablet Commonly known as: XANAX TAKE 1/2 TO 1 TABLET BY MOUTH TWICE DAILY AS NEEDED FOR ANXIETY(MUST LAST 30 DAYS)   amphetamine-dextroamphetamine 30 MG tablet Commonly known as: ADDERALL Take 1 tablet by mouth 2 (two) times daily. What changed: Another medication with the same name was removed. Continue taking this medication, and follow the directions you see here.   amphetamine-dextroamphetamine 30 MG tablet Commonly known as: Adderall Take 1 tablet by mouth 2 (two) times daily. Start taking on: Jan 13, 2021 What changed: Another medication with the same name was removed. Continue taking this  medication, and follow the directions you see here.   ARIPiprazole 10 MG tablet Commonly known as: ABILIFY TAKE 1 TABLET(10 MG) BY MOUTH DAILY What changed: See the new instructions.   azithromycin 500 MG tablet Commonly known as: Zithromax Take 1 tablet (500 mg total) by mouth once for 1 dose. Take 1 tablet daily for 3 days.   Breo Ellipta 100-25 MCG/INH Aepb Generic drug: fluticasone furoate-vilanterol Inhale 1 puff into the lungs daily at 12 noon.   cefdinir 300 MG capsule Commonly known as: OMNICEF Take 1 capsule (300 mg total) by mouth 2 (two) times daily for 2 days.   docusate sodium 100 MG capsule Commonly known as: COLACE Take 1 capsule (100 mg total) by mouth 2 (two) times daily.   DULoxetine 60 MG capsule Commonly known as: CYMBALTA TAKE 1 CAPSULE(60 MG) BY MOUTH DAILY   hydrOXYzine 10 MG tablet Commonly known as: ATARAX/VISTARIL TAKE 1 TO 3 TABLETS(10 TO 30 MG) BY MOUTH THREE TIMES DAILY AS NEEDED FOR ANXIETY What changed: See the new instructions.   ipratropium-albuterol 0.5-2.5 (3) MG/3ML Soln Commonly known as: DUONEB Take  3 mLs by nebulization every 6 (six) hours as needed.   Narcan 4 MG/0.1ML Liqd nasal spray kit Generic drug: naloxone Place 4 mg into the nose once.   nicotine 21 mg/24hr patch Commonly known as: NICODERM CQ - dosed in mg/24 hours Place 1 patch (21 mg total) onto the skin daily.   ondansetron 4 MG tablet Commonly known as: ZOFRAN Take 1 tablet (4 mg total) by mouth every 6 (six) hours as needed for nausea.   predniSONE 20 MG tablet Commonly known as: DELTASONE Take 2 tablets (40 mg total) by mouth daily with breakfast for 1 day. Start taking on: January 01, 2021   senna 8.6 MG Tabs tablet Commonly known as: SENOKOT Take 1 tablet (8.6 mg total) by mouth 2 (two) times daily.   varenicline 0.5 MG tablet Commonly known as: CHANTIX 1 p.o. daily for 2 days, then 1 p.o. twice daily until gone.  Then she will take the 1 mg  prescription What changed:   how much to take  how to take this  when to take this   varenicline 1 MG tablet Commonly known as: CHANTIX TAKE 1 TABLET BY MOUTH TWICE DAILY. START AFTER COMPLETING THE TWO WEEKS OF 0.5MG What changed: Another medication with the same name was changed. Make sure you understand how and when to take each.            Durable Medical Equipment  (From admission, onward)         Start     Ordered   12/31/20 1038  For home use only DME Nebulizer machine  Once       Question Answer Comment  Patient needs a nebulizer to treat with the following condition COPD (chronic obstructive pulmonary disease) ([place])   Length of Need Lifetime      12/31/20 1037         Allergies  Allergen Reactions  . Aspirin Hives  . Sulfonamide Derivatives Hives    Follow-up Information    [facility]. Schedule an appointment as soon as possible for a visit in 1 week(s).   Specialty: Family Medicine               The results of significant diagnostics from this hospitalization (including imaging, microbiology, ancillary and laboratory) are listed below for reference.    Significant Diagnostic Studies: DG Chest 2 View  Result Date: 12/27/2020 CLINICAL DATA:  Congestion, cough, shortness of breath for 2 days. EXAM: CHEST - 2 VIEW COMPARISON:  None. FINDINGS: The heart size and mediastinal contours are within normal limits. Both lungs are clear. The visualized skeletal structures are unremarkable. IMPRESSION: No active cardiopulmonary disease. Electronically Signed   By: Lucienne Capers M.D.   On: 12/27/2020 19:32   CT Angio Chest PE W and/or Wo Contrast  Result Date: 12/27/2020 CLINICAL DATA:  Pulmonary embolus suspected with high probability. Recent surgery. Shortness of breath and hypoxia. EXAM: CT ANGIOGRAPHY CHEST WITH CONTRAST TECHNIQUE: Multidetector CT imaging of the chest was performed using the standard protocol during bolus  administration of intravenous contrast. Multiplanar CT image reconstructions and MIPs were obtained to evaluate the vascular anatomy. CONTRAST:  20m OMNIPAQUE IOHEXOL 350 MG/ML SOLN COMPARISON:  Chest radiograph 12/27/2020 FINDINGS: Cardiovascular: There is good opacification of the central and segmental pulmonary arteries. No focal filling defects. No evidence of significant pulmonary embolus. Normal heart size. No pericardial effusions. Normal caliber thoracic aorta. No aortic dissection. Great vessel origins are patent. Mediastinum/Nodes: Esophagus is decompressed.  Scattered mediastinal lymph nodes are not pathologically enlarged. Thyroid gland is unremarkable. Lungs/Pleura: Patchy focal areas of airspace disease scattered throughout both lungs, most prominent in the perihilar and basilar regions. Changes may represent multifocal pneumonia. No pleural effusions. No pneumothorax. Upper Abdomen: No acute abnormalities demonstrated in the visualized upper abdomen. Gallbladder is surgically absent. Calcified splenic artery aneurysm measuring 1.4 cm diameter. Musculoskeletal: Degenerative changes in the spine. No destructive bone lesions. Review of the MIP images confirms the above findings. IMPRESSION: 1. No evidence of significant pulmonary embolus. 2. Patchy focal areas of airspace disease scattered throughout both lungs, most prominent in the perihilar and basilar regions. Changes may represent multifocal pneumonia. 3. Calcified splenic artery aneurysm measuring 1.4 cm diameter. Electronically Signed   By: Lucienne Capers M.D.   On: 12/27/2020 22:19    Microbiology: Recent Results (from the past 240 hour(s))  Resp Panel by RT-PCR (Flu A&B, Covid) Nasopharyngeal Swab     Status: None   Collection Time: 12/27/20  9:15 PM   Specimen: Nasopharyngeal Swab; Nasopharyngeal(NP) swabs in vial transport medium  Result Value Ref Range Status   SARS Coronavirus 2 by RT PCR NEGATIVE NEGATIVE Final    Comment:  (NOTE) SARS-CoV-2 target nucleic acids are NOT DETECTED.  The SARS-CoV-2 RNA is generally detectable in upper respiratory specimens during the acute phase of infection. The lowest concentration of SARS-CoV-2 viral copies this assay can detect is 138 copies/mL. A negative result does not preclude SARS-Cov-2 infection and should not be used as the sole basis for treatment or other patient management decisions. A negative result may occur with  improper specimen collection/handling, submission of specimen other than nasopharyngeal swab, presence of viral mutation(s) within the areas targeted by this assay, and inadequate number of viral copies(<138 copies/mL). A negative result must be combined with clinical observations, patient history, and epidemiological information. The expected result is Negative.  Fact Sheet for Patients:  EntrepreneurPulse.com.au  Fact Sheet for Healthcare Providers:  IncredibleEmployment.be  This test is no t yet approved or cleared by the [place] FDA and  has been authorized for detection and/or diagnosis of SARS-CoV-2 by FDA under an Emergency Use Authorization (EUA). This EUA will remain  in effect (meaning this test can be used) for the duration of the COVID-19 declaration under Section 564(b)(1) of the Act, 21 U.S.C.section 360bbb-3(b)(1), unless the authorization is terminated  or revoked sooner.       Influenza A by PCR NEGATIVE NEGATIVE Final   Influenza B by PCR NEGATIVE NEGATIVE Final    Comment: (NOTE) The Xpert Xpress SARS-CoV-2/FLU/RSV plus assay is intended as an aid in the diagnosis of influenza from Nasopharyngeal swab specimens and should not be used as a sole basis for treatment. Nasal washings and aspirates are unacceptable for Xpert Xpress SARS-CoV-2/FLU/RSV testing.  Fact Sheet for Patients: EntrepreneurPulse.com.au  Fact Sheet for Healthcare  Providers: IncredibleEmployment.be  This test is not yet approved or cleared by the [place] FDA and has been authorized for detection and/or diagnosis of SARS-CoV-2 by FDA under an Emergency Use Authorization (EUA). This EUA will remain in effect (meaning this test can be used) for the duration of the COVID-19 declaration under Section 564(b)(1) of the Act, 21 U.S.C. section 360bbb-3(b)(1), unless the authorization is terminated or revoked.  Performed at [facility]      Labs: Basic Metabolic Panel: Recent Labs  Lab 12/27/20 2115 12/30/20 0439 12/31/20 0434  NA 141 146* 141  K 4.1 3.5 3.5  CL 106  111 107  CO2 _0 GLUCOSE 119* 93 87  BUN 22 27* 20  CREATININE 0.77 0.77 0.85  CALCIUM 9.2 8.7* 8.3*   Liver Function Tests: No results for input(s): AST, ALT, ALKPHOS, BILITOT, PROT, ALBUMIN in the last 168 hours. No results for input(s): LIPASE, AMYLASE in the last 168 hours. No results for input(s): AMMONIA in the last 168 hours. CBC: Recent Labs  Lab 12/27/20 2115 12/30/20 0439 12/31/20 0434  WBC 15.0* 11.6* 9.7  NEUTROABS 12.9* 7.4 5.4  HGB 10.3* 10.1* 9.6*  HCT 32.0* 32.4* 30.3*  MCV 92.5 93.9 92.1  PLT 236 260 238   Cardiac Enzymes: No results for input(s): CKTOTAL, CKMB, CKMBINDEX, TROPONINI in the last 168 hours. BNP: BNP (last 3 results) No results for input(s): BNP in the last 8760 hours.  ProBNP (last 3 results) No results for input(s): PROBNP in the last 8760 hours.  CBG: No results for input(s): GLUCAP in the last 168 hours.     Signed:  Alma Friendly, MD [facility] 12/31/2020, 5:21 PM

## 2021-01-03 ENCOUNTER — Telehealth: Payer: Self-pay | Admitting: Physician Assistant

## 2021-01-03 ENCOUNTER — Other Ambulatory Visit: Payer: Self-pay

## 2021-01-03 ENCOUNTER — Other Ambulatory Visit: Payer: Self-pay | Admitting: Physician Assistant

## 2021-01-03 MED ORDER — ALPRAZOLAM 0.5 MG PO TABS: ORAL_TABLET | ORAL | 0 refills | Status: DC

## 2021-01-03 NOTE — Telephone Encounter (Signed)
Prescription was sent

## 2021-01-03 NOTE — Telephone Encounter (Signed)
Diana Alvarado called to request refill of her Xanax.  [name] it was time to renew.  Has appt 03/18/21.  Send to [facility] on [address] [address]

## 2021-01-03 NOTE — Telephone Encounter (Signed)
Pended for review

## 2021-02-01 ENCOUNTER — Other Ambulatory Visit: Payer: Self-pay | Admitting: Physician Assistant

## 2021-02-02 NOTE — Telephone Encounter (Signed)
Last filled 01/03/21

## 2021-02-05 ENCOUNTER — Other Ambulatory Visit: Payer: Self-pay | Admitting: Physician Assistant

## 2021-02-08 ENCOUNTER — Other Ambulatory Visit: Payer: Self-pay

## 2021-02-08 ENCOUNTER — Telehealth: Payer: Self-pay | Admitting: Physician Assistant

## 2021-02-08 NOTE — Telephone Encounter (Signed)
Pt requesting Rx for Adderall 30 mg to [facility] [address]. Refill due 6/9

## 2021-02-08 NOTE — Telephone Encounter (Signed)
pended

## 2021-02-09 MED ORDER — AMPHETAMINE-DEXTROAMPHETAMINE 30 MG PO TABS: 30.0000 mg | ORAL_TABLET | Freq: Two times a day (BID) | ORAL | 0 refills | Status: DC

## 2021-02-21 ENCOUNTER — Other Ambulatory Visit: Payer: Self-pay | Admitting: Physician Assistant

## 2021-02-21 NOTE — Telephone Encounter (Signed)
Last filled 6/1

## 2021-03-16 ENCOUNTER — Telehealth: Payer: Self-pay | Admitting: Physician Assistant

## 2021-03-16 ENCOUNTER — Other Ambulatory Visit: Payer: Self-pay

## 2021-03-16 NOTE — Telephone Encounter (Signed)
Pt would like a refill on Adderall 30mg . Please send to [facility] on [address].

## 2021-03-17 ENCOUNTER — Other Ambulatory Visit: Payer: Self-pay

## 2021-03-17 ENCOUNTER — Other Ambulatory Visit: Payer: Self-pay | Admitting: Physician Assistant

## 2021-03-17 MED ORDER — AMPHETAMINE-DEXTROAMPHETAMINE 30 MG PO TABS: 30.0000 mg | ORAL_TABLET | Freq: Two times a day (BID) | ORAL | 0 refills | Status: DC

## 2021-03-17 NOTE — Telephone Encounter (Signed)
Please approve refill its pended

## 2021-03-17 NOTE — Telephone Encounter (Signed)
Rx was sent  

## 2021-03-17 NOTE — Telephone Encounter (Signed)
Pt called back in stating that the pharmacy doesn't have prescription for Adderall 30mg  she is completely out. Pharmacy [facility] [address] [place], [place]

## 2021-03-18 ENCOUNTER — Ambulatory Visit: Payer: Medicare Other | Admitting: Physician Assistant

## 2021-03-31 ENCOUNTER — Other Ambulatory Visit: Payer: Self-pay | Admitting: Physician Assistant

## 2021-03-31 NOTE — Telephone Encounter (Signed)
Last filled 03/03/21

## 2021-04-14 ENCOUNTER — Telehealth: Payer: Self-pay | Admitting: Physician Assistant

## 2021-04-14 NOTE — Telephone Encounter (Signed)
[place] called to request refill of her Adderall.  Appt 05/04/21 but will be out of the Adderall tomorrow.  Please send to [facility] on [address]

## 2021-04-18 ENCOUNTER — Other Ambulatory Visit: Payer: Self-pay

## 2021-04-18 MED ORDER — AMPHETAMINE-DEXTROAMPHETAMINE 30 MG PO TABS: 30.0000 mg | ORAL_TABLET | Freq: Two times a day (BID) | ORAL | 0 refills | Status: DC

## 2021-04-18 NOTE — Telephone Encounter (Signed)
Pended.

## 2021-04-18 NOTE — Telephone Encounter (Signed)
Diana Alvarado called to check status of refill of her Adderall.  She called 8/11 and will be out today.  Please send the refill to [facility] on [address].  Appt 8/31

## 2021-04-28 ENCOUNTER — Other Ambulatory Visit: Payer: Self-pay | Admitting: Physician Assistant

## 2021-04-29 NOTE — Telephone Encounter (Signed)
Pt called in for refill for Xanax 0.'5mg'$ . Appt 8/32. Ph [phone]. Pharmacy [facility] [address] [place], [place]

## 2021-04-29 NOTE — Telephone Encounter (Signed)
Last filled 7/28

## 2021-05-04 ENCOUNTER — Encounter: Payer: Self-pay | Admitting: Physician Assistant

## 2021-05-04 ENCOUNTER — Other Ambulatory Visit: Payer: Self-pay

## 2021-05-04 ENCOUNTER — Ambulatory Visit: Payer: Medicare Other | Admitting: Physician Assistant

## 2021-05-04 VITALS — BP 111/82 | HR 84

## 2021-05-04 DIAGNOSIS — F411 Generalized anxiety disorder: Secondary | ICD-10-CM | POA: Diagnosis not present

## 2021-05-04 DIAGNOSIS — F99 Mental disorder, not otherwise specified: Secondary | ICD-10-CM

## 2021-05-04 DIAGNOSIS — F3170 Bipolar disorder, currently in remission, most recent episode unspecified: Secondary | ICD-10-CM

## 2021-05-04 DIAGNOSIS — F909 Attention-deficit hyperactivity disorder, unspecified type: Secondary | ICD-10-CM | POA: Diagnosis not present

## 2021-05-04 DIAGNOSIS — F17200 Nicotine dependence, unspecified, uncomplicated: Secondary | ICD-10-CM

## 2021-05-04 DIAGNOSIS — F5105 Insomnia due to other mental disorder: Secondary | ICD-10-CM

## 2021-05-04 MED ORDER — DULOXETINE HCL 60 MG PO CPEP: ORAL_CAPSULE | ORAL | 1 refills | Status: DC

## 2021-05-04 MED ORDER — TRAZODONE HCL 100 MG PO TABS: 100.0000 mg | ORAL_TABLET | Freq: Every evening | ORAL | 5 refills | Status: DC | PRN

## 2021-05-04 MED ORDER — AMPHETAMINE-DEXTROAMPHETAMINE 30 MG PO TABS: 30.0000 mg | ORAL_TABLET | Freq: Two times a day (BID) | ORAL | 0 refills | Status: DC

## 2021-05-04 NOTE — Progress Notes (Signed)
[facility]  Patient ID: Diana Alvarado,  MRN: [id number]  PCP: [facility]  Date of Evaluation: 05/04/2021  time spent:30 minutes  Chief Complaint:  Chief Complaint   Insomnia; Anxiety; Depression; ADD; Follow-up      HISTORY/CURRENT STATUS: HPI For routine med check.  Only problem is insomnia.  This been worsening over the past month or so.  She probably gets 5 hours of sleep at most the majority of the time.  She does not nap during the day.  She has trouble going to sleep and staying asleep.  No caffeine later than noon.  Continues to have anxiety at times, small amount of Xanax is helpful when needed.  It is more so generalized sense of unease, not panic attacks.  Patient denies loss of interest in usual activities and is able to enjoy things.  Denies decreased energy or motivation.  Appetite has not changed.  No extreme sadness, tearfulness, or feelings of hopelessness. Denies suicidal or homicidal thoughts.  Patient denies increased energy with decreased need for sleep, no increased talkativeness, no racing thoughts, no impulsivity or risky behaviors, no increased spending, no increased libido, no grandiosity, no increased irritability or anger, no paranoia.  And no hallucinations.  She is able to focus on things and get things done in a timely manner.  The Adderall helps that as well as her mood.  "I cannot go off that.  It helps me so much."  She does not feel like it heightens the anxiety.  Individual Medical History/ Review of Systems: Changes? :Yes   right hip replacement in March.  Had pneumonia in April. Had kidney infection a few weeks ago.   Past medications for mental health diagnoses include: Xanax, Buspar wasn't effective, Cymbalta, Abilify, Chantix (only for a week)  Allergies: Aspirin and Sulfonamide derivatives  Current Medications:  Current Outpatient Medications:    acetaminophen (TYLENOL) 500 MG tablet, Take 500 mg  by mouth every 6 (six) hours as needed for mild pain or moderate pain., Disp: , Rfl:    albuterol (PROVENTIL HFA;VENTOLIN HFA) 108 (90 BASE) MCG/ACT inhaler, Inhale 2 puffs into the lungs every 6 (six) hours as needed for wheezing or shortness of breath., Disp: , Rfl:    ALPRAZolam (XANAX) 0.5 MG tablet, TAKE 1/2 TO 1 TABLET BY MOUTH TWICE DAILY AS NEEDED FOR ANXIETY, Disp: 20 tablet, Rfl: 0   [START ON 05/18/2021] amphetamine-dextroamphetamine (ADDERALL) 30 MG tablet, Take 1 tablet by mouth 2 (two) times daily., Disp: 60 tablet, Rfl: 0   ARIPiprazole (ABILIFY) 10 MG tablet, TAKE 1 TABLET(10 MG) BY MOUTH DAILY, Disp: 90 tablet, Rfl: 1   docusate sodium (COLACE) 100 MG capsule, Take 1 capsule (100 mg total) by mouth 2 (two) times daily., Disp: 60 capsule, Rfl: 0   fluticasone furoate-vilanterol (BREO ELLIPTA) 100-25 MCG/INH AEPB, Inhale 1 puff into the lungs daily at 12 noon., Disp: , Rfl:    hydrOXYzine (ATARAX/VISTARIL) 10 MG tablet, TAKE 1 TO 3 TABLETS(10 TO 30 MG) BY MOUTH THREE TIMES DAILY AS NEEDED FOR ANXIETY (Patient taking differently: Take 10 mg by mouth every 6 (six) hours as needed for anxiety.), Disp: 60 tablet, Rfl: 1   NARCAN 4 MG/0.1ML LIQD nasal spray kit, Place 4 mg into the nose once., Disp: , Rfl:    ondansetron (ZOFRAN) 4 MG tablet, Take 1 tablet (4 mg total) by mouth every 6 (six) hours as needed for nausea., Disp: 20 tablet, Rfl: 0   senna (SENOKOT) 8.6 MG  TABS tablet, Take 1 tablet (8.6 mg total) by mouth 2 (two) times daily., Disp: 120 tablet, Rfl: 0   traZODone (DESYREL) 100 MG tablet, Take 1-2 tablets (100-200 mg total) by mouth at bedtime as needed for sleep., Disp: 60 tablet, Rfl: 5   [START ON 07/16/2021] amphetamine-dextroamphetamine (ADDERALL) 30 MG tablet, Take 1 tablet by mouth 2 (two) times daily., Disp: 60 tablet, Rfl: 0   [START ON 06/16/2021] amphetamine-dextroamphetamine (ADDERALL) 30 MG tablet, Take 1 tablet by mouth 2 (two) times daily., Disp: 60 tablet, Rfl: 0    DULoxetine (CYMBALTA) 60 MG capsule, TAKE 1 CAPSULE(60 MG) BY MOUTH DAILY, Disp: 90 capsule, Rfl: 1   ipratropium-albuterol (DUONEB) 0.5-2.5 (3) MG/3ML SOLN, Take 3 mLs by nebulization every 6 (six) hours as needed. (Patient not taking: Reported on 05/04/2021), Disp: 360 mL, Rfl: 0   varenicline (CHANTIX) 0.5 MG tablet, 1 p.o. daily for 2 days, then 1 p.o. twice daily until gone.  Then she will take the 1 mg prescription (Patient not taking: Reported on 05/04/2021), Disp: 30 tablet, Rfl: 0   varenicline (CHANTIX) 1 MG tablet, TAKE 1 TABLET BY MOUTH TWICE DAILY. START AFTER COMPLETING THE TWO WEEKS OF 0.5MG (Patient not taking: Reported on 05/04/2021), Disp: 60 tablet, Rfl: 1 Medication Side Effects: none  Family Medical/ Social History: Changes?  No  MENTAL HEALTH EXAM:  Blood pressure 111/82, pulse 84.There is no height or weight on file to calculate BMI.  General Appearance: Casual, Neat and Well Groomed  Eye Contact:  Good  Speech:  Clear and Coherent and Normal Rate  Volume:  Normal  Mood:  Euthymic  Affect:  Congruent  Thought Process:  Goal Directed and Descriptions of Associations: Intact  Orientation:  Full (Time, Place, and Person)  Thought Content: Logical   Suicidal Thoughts:  No  Homicidal Thoughts:  No  Memory:  WNL  Judgement:  Good  Insight:  Good  Psychomotor Activity:  Normal  Concentration:  Concentration: Good and Attention Span: Good  Recall:  Good  Fund of Knowledge: Good  Language: Good  Assets:  Desire for Improvement  ADL's:  Intact  Cognition: WNL  Prognosis:  Good   Most recent pertinent labs: 12/31/2020 Glucose 87 PCP follows her labs.   DIAGNOSES:    ICD-10-CM   1. Bipolar disorder in full remission, most recent episode unspecified type ([place])  F31.70     2. Insomnia due to other mental disorder  F51.05    F99     3. Attention deficit hyperactivity disorder (ADHD), unspecified ADHD type  F90.9     4. Anxiety state  F41.1     5. Smoker   F17.200        Receiving Psychotherapy: No    RECOMMENDATIONS:  PDMP was reviewed.  Last Xanax filled 05/02/2021.  Last Adderall 04/18/2021. I provided 30 minutes of face to face time during this encounter, including time spent before and after the visit in records review, medical decision making, and charting.  She is doing well as far as her routine medications go.  No changes will be made. Discussed sleep hygiene.  She has taken trazodone in the past so will restart that. Smoking cessation counseling was given. Continue Xanax 0.5 mg, 1/2-1 p.o. twice daily as needed but uses sparingly as possible. Continue Adderall 30 mg, 1 p.o. twice daily. Continue Abilify 10 mg every morning. Continue Cymbalta 60 mg 1 p.o. every morning. Continue hydroxyzine 10 mg, 1-3 p.o. 3 times daily as needed. Restart trazodone  100 mg, 1-2 p.o. nightly as needed sleep. Restart Chantix. Recommend counseling. Return in 6 months.  Donnal Moat, PA-C

## 2021-05-18 ENCOUNTER — Telehealth: Payer: Self-pay | Admitting: Physician Assistant

## 2021-05-18 NOTE — Telephone Encounter (Signed)
Pt called and the scripts written for [facility] need to be cancelled and call into [facility] [address].  [facility] doesn't have them but [facility] does.

## 2021-05-19 ENCOUNTER — Other Ambulatory Visit: Payer: Self-pay

## 2021-05-19 MED ORDER — AMPHETAMINE-DEXTROAMPHETAMINE 30 MG PO TABS: 30.0000 mg | ORAL_TABLET | Freq: Two times a day (BID) | ORAL | 0 refills | Status: DC

## 2021-05-19 NOTE — Telephone Encounter (Signed)
Rx sent for Adderall on 09/14 to [facility] but they do not have in stock. Asking to change to [facility]. Will pend Rx and cancel with [facility]  Last refill 04/18/21

## 2021-05-19 NOTE — Telephone Encounter (Signed)
Noted  

## 2021-06-03 ENCOUNTER — Other Ambulatory Visit: Payer: Self-pay | Admitting: Physician Assistant

## 2021-06-03 NOTE — Telephone Encounter (Signed)
Last filled 8/29

## 2021-06-03 NOTE — Telephone Encounter (Signed)
Pt called requesting new Rx for Xanax 0.5 mg @ [facility]. Apt 11/01/21

## 2021-06-29 ENCOUNTER — Other Ambulatory Visit: Payer: Self-pay | Admitting: Physician Assistant

## 2021-07-18 ENCOUNTER — Other Ambulatory Visit: Payer: Self-pay | Admitting: Physician Assistant

## 2021-07-18 MED ORDER — AMPHETAMINE-DEXTROAMPHETAMINE 30 MG PO TABS: 30.0000 mg | ORAL_TABLET | Freq: Two times a day (BID) | ORAL | 0 refills | Status: DC

## 2021-07-18 NOTE — Telephone Encounter (Signed)
Pt LVM stating [facility] [address] has Adderall 30mg .  Pls send script there.   Next appt 2/28

## 2021-08-06 ENCOUNTER — Other Ambulatory Visit: Payer: Self-pay | Admitting: Physician Assistant

## 2021-08-15 ENCOUNTER — Telehealth: Payer: Self-pay | Admitting: Physician Assistant

## 2021-08-15 ENCOUNTER — Other Ambulatory Visit: Payer: Self-pay

## 2021-08-15 MED ORDER — AMPHETAMINE-DEXTROAMPHETAMINE 30 MG PO TABS: 30.0000 mg | ORAL_TABLET | Freq: Two times a day (BID) | ORAL | 0 refills | Status: DC

## 2021-08-15 NOTE — Telephone Encounter (Signed)
Last filled 11/14 due 12/13

## 2021-08-15 NOTE — Telephone Encounter (Signed)
Pt called and said that she needs a [facility] [place]

## 2021-09-06 ENCOUNTER — Other Ambulatory Visit: Payer: Self-pay | Admitting: Physician Assistant

## 2021-09-06 ENCOUNTER — Telehealth: Payer: Self-pay | Admitting: Physician Assistant

## 2021-09-06 MED ORDER — VARENICLINE TARTRATE 0.5 MG PO TABS: ORAL_TABLET | ORAL | 0 refills | Status: DC

## 2021-09-06 MED ORDER — VARENICLINE TARTRATE 1 MG PO TABS: ORAL_TABLET | ORAL | 1 refills | Status: DC

## 2021-09-06 NOTE — Telephone Encounter (Signed)
Pt informed

## 2021-09-06 NOTE — Telephone Encounter (Signed)
Pt wants [name] to call in her Chiantix again so she can try to stop smoking. She would like [name] to call in the 0.5mg  tabs to start. She wants it called to [place], [place].

## 2021-09-06 NOTE — Telephone Encounter (Signed)
I sent in the prescription to [place].

## 2021-09-06 NOTE — Telephone Encounter (Signed)
I think she is referring to chantix,is that med discontinued?

## 2021-09-08 ENCOUNTER — Telehealth: Payer: Self-pay | Admitting: Physician Assistant

## 2021-09-08 NOTE — Telephone Encounter (Signed)
Pt LVM stating that she should be due for her Adderall script on the 10th or 11th of this month.  She wanted to go ahead and put the request in so it can be done.  Next appt 2/28

## 2021-09-08 NOTE — Telephone Encounter (Signed)
Filled 12/12 due 1/11

## 2021-09-12 NOTE — Telephone Encounter (Signed)
Pt LVM again.  I called and left message that it would be the 11th before it was available.   Next appt 2/28

## 2021-09-14 ENCOUNTER — Other Ambulatory Visit: Payer: Self-pay

## 2021-09-14 MED ORDER — AMPHETAMINE-DEXTROAMPHETAMINE 30 MG PO TABS: 30.0000 mg | ORAL_TABLET | Freq: Two times a day (BID) | ORAL | 0 refills | Status: DC

## 2021-09-14 NOTE — Telephone Encounter (Signed)
Pended.

## 2021-09-30 ENCOUNTER — Other Ambulatory Visit: Payer: Self-pay | Admitting: Physician Assistant

## 2021-10-02 ENCOUNTER — Other Ambulatory Visit: Payer: Self-pay | Admitting: Physician Assistant

## 2021-10-10 ENCOUNTER — Telehealth: Payer: Self-pay | Admitting: Physician Assistant

## 2021-10-10 NOTE — Telephone Encounter (Signed)
Pt LVM that her Adderall is due to get filled [facility] [place]  Next appt 2/28

## 2021-10-10 NOTE — Telephone Encounter (Signed)
Filled 1/11 due 2/10

## 2021-10-13 NOTE — Telephone Encounter (Signed)
Pt left a message checking on the status of adderall.

## 2021-10-14 ENCOUNTER — Other Ambulatory Visit: Payer: Self-pay

## 2021-10-14 MED ORDER — AMPHETAMINE-DEXTROAMPHETAMINE 30 MG PO TABS: 30.0000 mg | ORAL_TABLET | Freq: Two times a day (BID) | ORAL | 0 refills | Status: DC

## 2021-10-14 NOTE — Telephone Encounter (Signed)
Pt called again today asking fort her adderall to be please sent to pharmacy.  This is the third time she has called Please send it today

## 2021-10-14 NOTE — Telephone Encounter (Signed)
Yes I LVM because they had me on hold for 20 mins

## 2021-10-14 NOTE — Telephone Encounter (Signed)
Pended.

## 2021-10-14 NOTE — Telephone Encounter (Signed)
Did you cancel the other Rx? I figured you did but just checking

## 2021-10-14 NOTE — Telephone Encounter (Signed)
Pt called back.  Diana Alvarado it took so long for [name] to call to [name] that they ran out.  Now [name].  Pls call it in, she can't go to the beach until she gets this taken care of.  Next appt 2/28

## 2021-10-17 ENCOUNTER — Telehealth: Payer: Self-pay | Admitting: Physician Assistant

## 2021-10-17 NOTE — Telephone Encounter (Signed)
Next visit is 11/01/21. Requesting refill on Adderall called to:  [id number] [address], [place]  Phone:  [phone]  Fax:  [phone]    Per Lynn has it in [place].

## 2021-10-17 NOTE — Telephone Encounter (Signed)
Patient filled 2/10.

## 2021-10-23 ENCOUNTER — Other Ambulatory Visit: Payer: Self-pay | Admitting: Physician Assistant

## 2021-10-26 ENCOUNTER — Other Ambulatory Visit: Payer: Self-pay | Admitting: Physician Assistant

## 2021-11-01 ENCOUNTER — Other Ambulatory Visit: Payer: Self-pay

## 2021-11-01 ENCOUNTER — Ambulatory Visit: Payer: Medicare Other | Admitting: Physician Assistant

## 2021-11-01 ENCOUNTER — Encounter: Payer: Self-pay | Admitting: Physician Assistant

## 2021-11-01 DIAGNOSIS — F909 Attention-deficit hyperactivity disorder, unspecified type: Secondary | ICD-10-CM | POA: Diagnosis not present

## 2021-11-01 DIAGNOSIS — F5105 Insomnia due to other mental disorder: Secondary | ICD-10-CM

## 2021-11-01 DIAGNOSIS — Z87891 Personal history of nicotine dependence: Secondary | ICD-10-CM

## 2021-11-01 DIAGNOSIS — F3170 Bipolar disorder, currently in remission, most recent episode unspecified: Secondary | ICD-10-CM | POA: Diagnosis not present

## 2021-11-01 DIAGNOSIS — F99 Mental disorder, not otherwise specified: Secondary | ICD-10-CM

## 2021-11-01 DIAGNOSIS — F411 Generalized anxiety disorder: Secondary | ICD-10-CM | POA: Diagnosis not present

## 2021-11-01 MED ORDER — AMPHETAMINE-DEXTROAMPHETAMINE 30 MG PO TABS: 30.0000 mg | ORAL_TABLET | Freq: Two times a day (BID) | ORAL | 0 refills | Status: DC

## 2021-11-01 NOTE — Progress Notes (Signed)
[facility]  Patient ID: Diana Alvarado,  MRN: [id number]  PCP: [facility], [facility]  Date of Evaluation: 11/01/2021 time spent:30 minutes  Chief Complaint:  Chief Complaint   Anxiety; Depression; Insomnia; ADD; Follow-up     HISTORY/CURRENT STATUS: HPI For routine med check.  As far as her mental health goes she is doing well.  Feels like all of her medications are working at these doses.  She is able to enjoy things.  Energy and motivation are good for the most part.  She has just found out that she will have to have rotator cuff surgery in March so is a little bit bummed about that.  Not isolating.  Not crying easily.  ADLs and personal hygiene are normal.  Sleeps well most of the time.  No suicidal or homicidal thoughts.  Still has anxiety at times, says she does not need the hydroxyzine.  But needs the Xanax.  She does use a small quantity each month for emergencies.  Not having panic attacks but more of a generalized sense of unease like something bad is going to happen at any time.  States that attention is good without easy distractibility.  Able to focus on things and finish tasks to completion.   Patient denies increased energy with decreased need for sleep, no increased talkativeness, no racing thoughts, no impulsivity or risky behaviors, no increased spending, no increased libido, no grandiosity, no increased irritability or anger, no paranoia.  And no hallucinations.  Denies dizziness, syncope, seizures, numbness, tingling, tremor, tics, unsteady gait, slurred speech, confusion. Denies muscle or joint pain, stiffness, or dystonia.  Individual Medical History/ Review of Systems: Changes? :Yes    She quit smoking! Has rotator cuff tear, will have surgery in March.   Past medications for mental health diagnoses include: Xanax, Buspar wasn't effective, Cymbalta, Abilify, Chantix (only for a week)  Allergies: Aspirin and Sulfonamide  derivatives  Current Medications:  Current Outpatient Medications:    acetaminophen (TYLENOL) 500 MG tablet, Take 500 mg by mouth every 6 (six) hours as needed for mild pain or moderate pain., Disp: , Rfl:    albuterol (PROVENTIL HFA;VENTOLIN HFA) 108 (90 BASE) MCG/ACT inhaler, Inhale 2 puffs into the lungs every 6 (six) hours as needed for wheezing or shortness of breath., Disp: , Rfl:    ALPRAZolam (XANAX) 0.5 MG tablet, TAKE 1/2 TO 1 TABLET BY MOUTH TWICE DAILY AS NEEDED FOR ANXIETY. MUST LAST 30 DAYS, Disp: 20 tablet, Rfl: 0   [START ON 12/10/2021] amphetamine-dextroamphetamine (ADDERALL) 30 MG tablet, Take 1 tablet by mouth 2 (two) times daily with breakfast and lunch., Disp: 60 tablet, Rfl: 0   [START ON 11/10/2021] amphetamine-dextroamphetamine (ADDERALL) 30 MG tablet, Take 1 tablet by mouth 2 (two) times daily with breakfast and lunch., Disp: 60 tablet, Rfl: 0   ARIPiprazole (ABILIFY) 10 MG tablet, TAKE 1 TABLET(10 MG) BY MOUTH DAILY, Disp: 90 tablet, Rfl: 1   docusate sodium (COLACE) 100 MG capsule, Take 1 capsule (100 mg total) by mouth 2 (two) times daily., Disp: 60 capsule, Rfl: 0   DULoxetine (CYMBALTA) 60 MG capsule, TAKE 1 CAPSULE(60 MG) BY MOUTH DAILY, Disp: 90 capsule, Rfl: 0   fluticasone furoate-vilanterol (BREO ELLIPTA) 100-25 MCG/INH AEPB, Inhale 1 puff into the lungs daily at 12 noon., Disp: , Rfl:    hydrOXYzine (ATARAX/VISTARIL) 10 MG tablet, TAKE 1 TO 3 TABLETS(10 TO 30 MG) BY MOUTH THREE TIMES DAILY AS NEEDED FOR ANXIETY (Patient taking differently: Take 10 mg by  mouth every 6 (six) hours as needed for anxiety.), Disp: 60 tablet, Rfl: 1   NARCAN 4 MG/0.1ML LIQD nasal spray kit, Place 4 mg into the nose once., Disp: , Rfl:    traZODone (DESYREL) 100 MG tablet, TAKE 1 TO 2 TABLETS(100 TO 200 MG) BY MOUTH AT BEDTIME AS NEEDED FOR SLEEP, Disp: 60 tablet, Rfl: 0   [START ON 01/08/2022] amphetamine-dextroamphetamine (ADDERALL) 30 MG tablet, Take 1 tablet by mouth 2 (two) times daily  with breakfast and lunch., Disp: 60 tablet, Rfl: 0   ipratropium-albuterol (DUONEB) 0.5-2.5 (3) MG/3ML SOLN, Take 3 mLs by nebulization every 6 (six) hours as needed. (Patient not taking: Reported on 05/04/2021), Disp: 360 mL, Rfl: 0   ondansetron (ZOFRAN) 4 MG tablet, Take 1 tablet (4 mg total) by mouth every 6 (six) hours as needed for nausea. (Patient not taking: Reported on 11/01/2021), Disp: 20 tablet, Rfl: 0   Oxycodone HCl 10 MG TABS, Take 10 mg by mouth 5 (five) times daily as needed., Disp: , Rfl:    senna (SENOKOT) 8.6 MG TABS tablet, Take 1 tablet (8.6 mg total) by mouth 2 (two) times daily. (Patient not taking: Reported on 11/01/2021), Disp: 120 tablet, Rfl: 0 Medication Side Effects: none  Family Medical/ Social History: Changes?  No  MENTAL HEALTH EXAM:  There were no vitals taken for this visit.There is no height or weight on file to calculate BMI.  General Appearance: Casual, Neat and Well Groomed  Eye Contact:  Good  Speech:  Clear and Coherent and Normal Rate  Volume:  Normal  Mood:  Euthymic  Affect:  Congruent  Thought Process:  Goal Directed and Descriptions of Associations: Circumstantial  Orientation:  Full (Time, Place, and Person)  Thought Content: Logical   Suicidal Thoughts:  No  Homicidal Thoughts:  No  Memory:  WNL  Judgement:  Good  Insight:  Good  Psychomotor Activity:  Normal  Concentration:  Concentration: Good and Attention Span: Good  Recall:  Good  Fund of Knowledge: Good  Language: Good  Assets:  Desire for Improvement  ADL's:  Intact  Cognition: WNL  Prognosis:  Good    08/17/2021  Lipid profile completely normal. Glucose 83, hemoglobin A1c 5.5 CMP normal except for creatinine 1.07, GFR 58, alkaline phosphatase 130 CBC completely normal  DIAGNOSES:    ICD-10-CM   1. Bipolar disorder in full remission, most recent episode unspecified type ([place])  F31.70     2. Quit smoking  Z87.891     3. Attention deficit hyperactivity disorder  (ADHD), unspecified ADHD type  F90.9     4. Generalized anxiety disorder  F41.1     5. Insomnia due to other mental disorder  F51.05    F99         Receiving Psychotherapy: No    RECOMMENDATIONS:  PDMP was reviewed.  Last Xanax filled 10/28/2021.  Last Adderall 10/14/2021.  Also on oxycodone. I provided 30 minutes of face to face time during this encounter, including time spent before and after the visit in records review, medical decision making, counseling pertinent to today's visit, and charting.  Congratulations on quitting smoking! We discussed the situation with Adderall.  This is a [employer], television/film/video and we have no control over it.  Recommend giving her a written prescription.  She is fine with that.  We also discussed Adzenys XR ODT, and may need to switch to that if the Adderall is unavailable.  She understands about the difference in dose even though it  is the same medication.  She also understands the cost of $35 and that she will need to get it through [facility] or a pharmacy in [place] that will mail it to her. She is doing well as far as her mental health goes though so no changes will be made.  Continue Xanax 0.5 mg, 1/2-1 p.o. twice daily as needed but uses sparingly as possible. Continue Adderall 30 mg, 1 p.o. twice daily.  3 separate prescriptions were printed out for her to hunt down which pharmacy has it that month. Continue Abilify 10 mg every morning. Continue Cymbalta 60 mg 1 p.o. every morning. Continue hydroxyzine 10 mg, 1-3 p.o. 3 times daily as needed.  Encouraged her to use this and not rely on the Xanax. Continue trazodone 100 mg, 1-2 p.o. nightly as needed sleep. Return in 6 months.  Donnal Moat, PA-C

## 2021-11-21 ENCOUNTER — Other Ambulatory Visit: Payer: Self-pay | Admitting: Physician Assistant

## 2021-11-22 NOTE — Telephone Encounter (Signed)
Last filled 2/24, must last 30 days per [facility], due 3/25 ?

## 2021-11-24 ENCOUNTER — Telehealth: Payer: Self-pay | Admitting: Physician Assistant

## 2021-11-24 ENCOUNTER — Other Ambulatory Visit: Payer: Self-pay

## 2021-11-24 MED ORDER — ALPRAZOLAM 0.5 MG PO TABS: ORAL_TABLET | ORAL | 0 refills | Status: DC

## 2021-11-24 NOTE — Telephone Encounter (Signed)
Pended.

## 2021-11-24 NOTE — Telephone Encounter (Signed)
Next visit is 05/05/22. Diana Alvarado is requesting a refill on her Xanax called to: ? ?[facility]  ?[place], [place] [address]  ?Phone:  [phone]  Fax:  [phone]  ? ?Diana Alvarado is going out of town and would like to pick it up before leaving.  ?

## 2021-12-26 ENCOUNTER — Other Ambulatory Visit: Payer: Self-pay | Admitting: Physician Assistant

## 2021-12-26 NOTE — Telephone Encounter (Signed)
Last filled 3/27, due 4/25  ?

## 2022-01-20 ENCOUNTER — Other Ambulatory Visit: Payer: Self-pay | Admitting: Physician Assistant

## 2022-01-23 ENCOUNTER — Other Ambulatory Visit: Payer: Self-pay | Admitting: Physician Assistant

## 2022-01-24 NOTE — Telephone Encounter (Signed)
Patient called in for refill on Xanax 0.'5mg'$ . Patients state that son passed away and she really needs it. Ph: [facility] [place] [facility]

## 2022-01-25 NOTE — Telephone Encounter (Signed)
Can you please call and give a verbal ok for this med? Or repend it to me, and put no RF. When I change it when working from home, it logs me out of epic. I'm out of the office today so not sure what time I'll be able to check messages anyway, so if you can do the verbal order that would be helpful. Thank you.

## 2022-01-25 NOTE — Telephone Encounter (Signed)
Pt called checking on this script.Please send script asap

## 2022-01-26 ENCOUNTER — Other Ambulatory Visit: Payer: Self-pay | Admitting: Adult Health

## 2022-01-26 MED ORDER — ALPRAZOLAM 0.5 MG PO TABS: ORAL_TABLET | ORAL | 0 refills | Status: DC

## 2022-01-26 NOTE — Telephone Encounter (Signed)
Script sent  

## 2022-02-06 ENCOUNTER — Other Ambulatory Visit: Payer: Self-pay | Admitting: Physician Assistant

## 2022-02-06 ENCOUNTER — Telehealth: Payer: Self-pay | Admitting: Physician Assistant

## 2022-02-06 MED ORDER — AMPHETAMINE-DEXTROAMPHETAMINE 30 MG PO TABS: 30.0000 mg | ORAL_TABLET | Freq: Two times a day (BID) | ORAL | 0 refills | Status: DC

## 2022-02-06 NOTE — Telephone Encounter (Signed)
Pt notified Rx available to pick up

## 2022-02-06 NOTE — Telephone Encounter (Signed)
Ok to get a paper rx?

## 2022-02-06 NOTE — Telephone Encounter (Signed)
Pt requesting printed Rx for generic Adderall 30 mg 2/d. Notify pt when ready to pick up at [phone]. Apt 9/1

## 2022-02-06 NOTE — Telephone Encounter (Signed)
Prescription written.  I will put in front office box and ask one of the ladies to call [place] to let her know it is ready.

## 2022-02-11 ENCOUNTER — Other Ambulatory Visit: Payer: Self-pay | Admitting: Physician Assistant

## 2022-02-23 ENCOUNTER — Other Ambulatory Visit: Payer: Self-pay | Admitting: Psychiatry

## 2022-03-02 ENCOUNTER — Telehealth: Payer: Self-pay | Admitting: Physician Assistant

## 2022-03-02 ENCOUNTER — Other Ambulatory Visit: Payer: Self-pay | Admitting: Physician Assistant

## 2022-03-02 MED ORDER — VARENICLINE TARTRATE 0.5 MG X 11 & 1 MG X 42 PO TBPK: ORAL_TABLET | ORAL | 0 refills | Status: DC

## 2022-03-02 NOTE — Telephone Encounter (Signed)
Please see message. Looking in her medication history it was 2013 when she was taking Chantix.

## 2022-03-02 NOTE — Telephone Encounter (Signed)
Prescription for the first month sent in.  Please ask her to make an appointment in a month.  Thanks.

## 2022-03-02 NOTE — Telephone Encounter (Signed)
Pt said she started back smoking after her son died.  She had stopped for 3 months.  She  would like to start over again with a script for generic Chantix, if Helene Kelp will prescribe it for her.  Pls send to [facility] on [address].  Next appt 9/1

## 2022-03-03 NOTE — Telephone Encounter (Signed)
Patient notified

## 2022-03-06 NOTE — Telephone Encounter (Signed)
Pt is scheduled for f/u appt 7/31

## 2022-03-13 ENCOUNTER — Other Ambulatory Visit: Payer: Self-pay | Admitting: Physician Assistant

## 2022-03-13 ENCOUNTER — Telehealth: Payer: Self-pay | Admitting: Physician Assistant

## 2022-03-13 MED ORDER — AMPHETAMINE-DEXTROAMPHETAMINE 30 MG PO TABS
30.0000 mg | ORAL_TABLET | Freq: Two times a day (BID) | ORAL | 0 refills | Status: DC
Start: 2022-03-13 — End: 2022-05-05

## 2022-03-13 NOTE — Telephone Encounter (Signed)
Pt called and said she would like to come by and get a paper script for her Adderall.  Pls call her when it's ready.  VQ:[phone]  Next appt 7/31

## 2022-03-13 NOTE — Telephone Encounter (Signed)
Prescription signed and put in front office box with instruction to call patient for pickup.

## 2022-03-13 NOTE — Telephone Encounter (Signed)
Please review

## 2022-04-03 ENCOUNTER — Ambulatory Visit: Payer: Medicare Other | Admitting: Physician Assistant

## 2022-04-03 ENCOUNTER — Other Ambulatory Visit: Payer: Self-pay | Admitting: Physician Assistant

## 2022-04-06 ENCOUNTER — Other Ambulatory Visit: Payer: Self-pay | Admitting: Physician Assistant

## 2022-04-06 ENCOUNTER — Telehealth: Payer: Self-pay | Admitting: Physician Assistant

## 2022-04-06 MED ORDER — AMPHETAMINE-DEXTROAMPHETAMINE 30 MG PO TABS: 30.0000 mg | ORAL_TABLET | Freq: Two times a day (BID) | ORAL | 0 refills | Status: DC

## 2022-04-06 NOTE — Telephone Encounter (Signed)
Next visit is 05/05/22. Diana Alvarado is requesting a paper copy for her Adderall 30 mg RX. She wants to pick it up this coming Monday. Phone number is [phone].

## 2022-04-06 NOTE — Telephone Encounter (Signed)
Please review

## 2022-04-06 NOTE — Telephone Encounter (Signed)
Prescription printed.  Will be put in the abdomen box for her to pick up next week.

## 2022-04-21 ENCOUNTER — Other Ambulatory Visit: Payer: Self-pay | Admitting: Physician Assistant

## 2022-04-26 ENCOUNTER — Encounter: Payer: Self-pay | Admitting: Internal Medicine

## 2022-05-05 ENCOUNTER — Ambulatory Visit (INDEPENDENT_AMBULATORY_CARE_PROVIDER_SITE_OTHER): Payer: Medicare Other | Admitting: Physician Assistant

## 2022-05-05 ENCOUNTER — Encounter: Payer: Self-pay | Admitting: Physician Assistant

## 2022-05-05 DIAGNOSIS — F909 Attention-deficit hyperactivity disorder, unspecified type: Secondary | ICD-10-CM | POA: Diagnosis not present

## 2022-05-05 DIAGNOSIS — F3170 Bipolar disorder, currently in remission, most recent episode unspecified: Secondary | ICD-10-CM

## 2022-05-05 DIAGNOSIS — F411 Generalized anxiety disorder: Secondary | ICD-10-CM

## 2022-05-05 DIAGNOSIS — F4321 Adjustment disorder with depressed mood: Secondary | ICD-10-CM

## 2022-05-05 DIAGNOSIS — F5105 Insomnia due to other mental disorder: Secondary | ICD-10-CM

## 2022-05-05 MED ORDER — AMPHETAMINE-DEXTROAMPHETAMINE 30 MG PO TABS
30.0000 mg | ORAL_TABLET | Freq: Two times a day (BID) | ORAL | 0 refills | Status: DC
Start: 2022-06-09 — End: 2022-08-07

## 2022-05-05 MED ORDER — AMPHETAMINE-DEXTROAMPHETAMINE 30 MG PO TABS
30.0000 mg | ORAL_TABLET | Freq: Two times a day (BID) | ORAL | 0 refills | Status: DC
Start: 2022-05-11 — End: 2024-06-19

## 2022-05-05 MED ORDER — ALPRAZOLAM 0.5 MG PO TABS: 0.2500 mg | ORAL_TABLET | Freq: Two times a day (BID) | ORAL | 3 refills | Status: AC | PRN

## 2022-05-05 MED ORDER — AMPHETAMINE-DEXTROAMPHETAMINE 30 MG PO TABS: 30.0000 mg | ORAL_TABLET | Freq: Two times a day (BID) | ORAL | 0 refills | Status: DC

## 2022-05-05 NOTE — Progress Notes (Unsigned)
[facility]  Patient ID: Diana Alvarado,  MRN: [id number]  PCP: [facility], [facility]  Date of Evaluation: 05/05/2022 time spent:30 minutes  Chief Complaint:  Chief Complaint   Anxiety; ADD; Insomnia; Follow-up    Virtual Visit via Telehealth  I connected with patient by telephone, with their informed consent, and verified patient privacy and that I am speaking with the correct person using two identifiers.  I am private, in my office and the patient is at home.  I discussed the limitations, risks, security and privacy concerns of performing an evaluation and management service by telephone and the availability of in person appointments. I also discussed with the patient that there may be a patient responsible charge related to this service. The patient expressed understanding and agreed to proceed.   I discussed the assessment and treatment plan with the patient. The patient was provided an opportunity to ask questions and all were answered. The patient agreed with the plan and demonstrated an understanding of the instructions.   The patient was advised to call back or seek an in-person evaluation if the symptoms worsen or if the condition fails to improve as anticipated.  I provided 30 minutes of non-face-to-face time during this encounter.  HISTORY/CURRENT STATUS: HPI For routine med check.  Son died from asthma attack a few months ago.  That has caused even more anxiety for her.  She was with him when he died in the bathroom of her home.  Having a hard time getting that image out of her mind.  I have been giving her a small amount of Xanax, it really helps but does not seem to be enough.  She is in grief counseling.   Is having a hard time enjoying things.  But until he passed away she did not feel any major symptoms of depression.  Energy and motivation had been good but not so much now.  ADLs and personal hygiene are normal.  Appetite is normal and  weight is stable.  She cries easily when thinking about her son.  Sleeps well as long as she takes the trazodone.  Unable to work d/t physical and mental health. No suicidal or homicidal thoughts.  The Adderall is still working well.  States that attention is good without easy distractibility.  Able to focus on things and finish tasks to completion.   Patient denies increased energy with decreased need for sleep, increased talkativeness, racing thoughts, impulsivity or risky behaviors, increased spending, increased libido, grandiosity, increased irritability or anger, paranoia, and no hallucinations.  Denies dizziness, syncope, seizures, numbness, tingling, tremor, tics, unsteady gait, slurred speech, confusion. Denies muscle or joint pain, stiffness, or dystonia. Denies unexplained weight loss, frequent infections, or sores that heal slowly.  No polyphagia, polydipsia, or polyuria. Denies visual changes or paresthesias.   Individual Medical History/ Review of Systems: Changes? :Yes   surgery on rotator cuff, finished PT  Past medications for mental health diagnoses include: Xanax, Buspar wasn't effective, Cymbalta, Abilify, Chantix (only for a week)  Allergies: Aspirin and Sulfonamide derivatives  Current Medications:  Current Outpatient Medications:    acetaminophen (TYLENOL) 500 MG tablet, Take 500 mg by mouth every 6 (six) hours as needed for mild pain or moderate pain., Disp: , Rfl:    ARIPiprazole (ABILIFY) 10 MG tablet, TAKE 1 TABLET(10 MG) BY MOUTH DAILY, Disp: 90 tablet, Rfl: 1   docusate sodium (COLACE) 100 MG capsule, Take 1 capsule (100 mg total) by mouth 2 (two) times daily., Disp:  60 capsule, Rfl: 0   DULoxetine (CYMBALTA) 60 MG capsule, TAKE 1 CAPSULE(60 MG) BY MOUTH DAILY, Disp: 90 capsule, Rfl: 0   fluticasone furoate-vilanterol (BREO ELLIPTA) 100-25 MCG/INH AEPB, Inhale 1 puff into the lungs daily at 12 noon., Disp: , Rfl:    hydrOXYzine (ATARAX/VISTARIL) 10 MG tablet, TAKE 1  TO 3 TABLETS(10 TO 30 MG) BY MOUTH THREE TIMES DAILY AS NEEDED FOR ANXIETY (Patient taking differently: Take 10 mg by mouth every 6 (six) hours as needed for anxiety.), Disp: 60 tablet, Rfl: 1   NARCAN 4 MG/0.1ML LIQD nasal spray kit, Place 4 mg into the nose once., Disp: , Rfl:    Oxycodone HCl 10 MG TABS, Take 10 mg by mouth 5 (five) times daily as needed., Disp: , Rfl:    traZODone (DESYREL) 100 MG tablet, TAKE 1 TO 2 TABLETS(100 TO 200 MG) BY MOUTH AT BEDTIME AS NEEDED FOR SLEEP, Disp: 60 tablet, Rfl: 5   albuterol (PROVENTIL HFA;VENTOLIN HFA) 108 (90 BASE) MCG/ACT inhaler, Inhale 2 puffs into the lungs every 6 (six) hours as needed for wheezing or shortness of breath. (Patient not taking: Reported on 05/05/2022), Disp: , Rfl:    ALPRAZolam (XANAX) 0.5 MG tablet, Take 0.5-1 tablets (0.25-0.5 mg total) by mouth 2 (two) times daily as needed. Must last 30 days, Disp: 30 tablet, Rfl: 3   [START ON 07/08/2022] amphetamine-dextroamphetamine (ADDERALL) 30 MG tablet, Take 1 tablet by mouth 2 (two) times daily with breakfast and lunch., Disp: 60 tablet, Rfl: 0   [START ON 06/09/2022] amphetamine-dextroamphetamine (ADDERALL) 30 MG tablet, Take 1 tablet by mouth 2 (two) times daily with breakfast and lunch., Disp: 60 tablet, Rfl: 0   [START ON 05/11/2022] amphetamine-dextroamphetamine (ADDERALL) 30 MG tablet, Take 1 tablet by mouth 2 (two) times daily with breakfast and lunch., Disp: 60 tablet, Rfl: 0   ipratropium-albuterol (DUONEB) 0.5-2.5 (3) MG/3ML SOLN, Take 3 mLs by nebulization every 6 (six) hours as needed. (Patient not taking: Reported on 05/04/2021), Disp: 360 mL, Rfl: 0   ondansetron (ZOFRAN) 4 MG tablet, Take 1 tablet (4 mg total) by mouth every 6 (six) hours as needed for nausea. (Patient not taking: Reported on 11/01/2021), Disp: 20 tablet, Rfl: 0   senna (SENOKOT) 8.6 MG TABS tablet, Take 1 tablet (8.6 mg total) by mouth 2 (two) times daily. (Patient not taking: Reported on 11/01/2021), Disp: 120 tablet,  Rfl: 0   varenicline (CHANTIX PAK) 0.5 MG X 11 & 1 MG X 42 tablet, Take one 0.5 mg tablet by mouth once daily for 3 days, then increase to one 0.5 mg tablet twice daily for 4 days, then increase to one 1 mg tablet twice daily. (Patient not taking: Reported on 05/05/2022), Disp: 53 tablet, Rfl: 0 Medication Side Effects: none  Family Medical/ Social History: Changes?  Son died  MENTAL HEALTH EXAM:  There were no vitals taken for this visit.There is no height or weight on file to calculate BMI.  General Appearance:  Unable to assess  Eye Contact:   Unable to assess  Speech:  Clear and Coherent and Normal Rate  Volume:  Normal  Mood:   sad  Affect:   Unable to assess  Thought Process:  Goal Directed and Descriptions of Associations: Circumstantial  Orientation:  Full (Time, Place, and Person)  Thought Content: Logical   Suicidal Thoughts:  No  Homicidal Thoughts:  No  Memory:  WNL  Judgement:  Good  Insight:  Good  Psychomotor Activity:   Unable to assess  Concentration:  Concentration: Good and Attention Span: Good  Recall:  Good  Fund of Knowledge: Good  Language: Good  Assets:  Desire for Improvement  ADL's:  Intact  Cognition: WNL  Prognosis:  Good   Primary CARE follows lipids and glucose.  DIAGNOSES:    ICD-10-CM   1. Bipolar disorder in full remission, most recent episode unspecified type ([place])  F31.70     2. Attention deficit hyperactivity disorder (ADHD), unspecified ADHD type  F90.9     3. Generalized anxiety disorder  F41.1     4. Insomnia due to other mental disorder  F51.05    F99     5. Grief  F43.21       Receiving Psychotherapy: Yes   RECOMMENDATIONS:  PDMP was reviewed.  Last Xanax filled 04/05/2022.  Last Adderall 04/12/2022.  Also on oxycodone. Also on Fentanyl. I provided 30  minutes of non-face-to-face time during this encounter, including time spent before and after the visit in records review, medical decision making, counseling pertinent to today's  visit, and charting.   My condolences and the loss of her son.  We again discussed the anxiety.  I would really like to wean her off of the Adderall.  She is not ready to make any major changes right now and I can understand that.  But she was made aware that the anxiety could be better treated if she was not on a stimulant.  She verbalizes understanding. Because of her current grief and anxiety associated with that I we will increase the quantity of Xanax per month but again reminded her to take as sparingly as possible.  Congratulations on quitting smoking and staying off any nicotine products.  Sleep hygiene discussed.  Continue Xanax 0.5 mg, 1/2-1 p.o. twice daily as needed but uses sparingly as possible. Continue Adderall 30 mg, 1 p.o. twice daily.  Continue Abilify 10 mg every morning. Continue Cymbalta 60 mg 1 p.o. every morning. Continue hydroxyzine 10 mg, 1-3 p.o. 3 times daily as needed.  Encouraged her to use this and not rely on the Xanax. Continue trazodone 100 mg, 1-2 p.o. nightly as needed sleep. Return in 4 months.  Donnal Moat, PA-C

## 2022-05-10 ENCOUNTER — Encounter: Payer: Self-pay | Admitting: Internal Medicine

## 2022-05-23 ENCOUNTER — Ambulatory Visit (AMBULATORY_SURGERY_CENTER): Payer: Medicare Other | Admitting: *Deleted

## 2022-05-23 VITALS — Ht 61.0 in | Wt 160.0 lb

## 2022-05-23 DIAGNOSIS — Z800 Family history of malignant neoplasm of digestive organs: Secondary | ICD-10-CM

## 2022-05-23 MED ORDER — NA SULFATE-K SULFATE-MG SULF 17.5-3.13-1.6 GM/177ML PO SOLN: 1.0000 | Freq: Once | ORAL | 0 refills | Status: AC

## 2022-05-23 NOTE — Progress Notes (Signed)
No egg or soy allergy known to patient  No issues known to pt with past sedation with any surgeries or procedures Patient denies ever being told they had issues or difficulty with intubation  No FH of Malignant Hyperthermia Pt is not on diet pills Pt is not on  home 02  Pt is not on blood thinners  Pt has issues with constipation , 2 day prep given. No A fib or A flutter Have any cardiac testing pending--no Pt instructed to use [email or web address] or GoodRx for a price reduction on prep

## 2022-05-30 ENCOUNTER — Encounter: Payer: Self-pay | Admitting: Internal Medicine

## 2022-06-05 ENCOUNTER — Telehealth: Payer: Self-pay | Admitting: Physician Assistant

## 2022-06-05 NOTE — Telephone Encounter (Signed)
Approved with pharmacy pt informed

## 2022-06-05 NOTE — Telephone Encounter (Signed)
Pt LVM  at 9:13a.  She was surprised with an anniversary cruise and will be leaving for [place] [employer].  She knows her script for Adderall '30mg'$  twice a day is not due until this Friday, but she wants to know if you can send it in early for her to pick up today or tomorrow morning.  She hopes insurance will pay for it but if not she will.  She doesn't want to be without it on the cruise and you can't get it on the ship. She said it is in stock at [facility]. Also she is asking for the refill of Xanax to be picked up at the same time, same pharmacy.  That one should not be an early refill.  Next appt 1/3

## 2022-06-05 NOTE — Telephone Encounter (Signed)
Please advise if she can get early

## 2022-06-05 NOTE — Telephone Encounter (Signed)
Yes please call the pharmacy and okay in early refill on the Adderall.  Let patient know that she will not be able to pick up next month's early though.  I recommend that she take only enough medications with her, especially the controlled substances, to last while she is gone. Thank you.

## 2022-06-13 ENCOUNTER — Encounter: Payer: Self-pay | Admitting: Internal Medicine

## 2022-06-13 ENCOUNTER — Ambulatory Visit (AMBULATORY_SURGERY_CENTER): Payer: Medicare Other | Admitting: Internal Medicine

## 2022-06-13 VITALS — BP 124/81 | HR 73 | Temp 98.0°F | Resp 15 | Ht 61.0 in | Wt 160.0 lb

## 2022-06-13 DIAGNOSIS — D124 Benign neoplasm of descending colon: Secondary | ICD-10-CM

## 2022-06-13 DIAGNOSIS — Z800 Family history of malignant neoplasm of digestive organs: Secondary | ICD-10-CM

## 2022-06-13 DIAGNOSIS — Z1211 Encounter for screening for malignant neoplasm of colon: Secondary | ICD-10-CM | POA: Diagnosis not present

## 2022-06-13 MED ORDER — SODIUM CHLORIDE 0.9 % IV SOLN: 500.0000 mL | Freq: Once | INTRAVENOUS | Status: AC

## 2022-06-13 NOTE — Op Note (Signed)
[facility] Patient Name: Diana Alvarado Procedure Date: 06/13/2022 11:29 AM MRN: [id number] Endoscopist: Docia Chuck. Henrene Pastor , MD Age: 66 Referring MD:  Date of Birth: [birth year] Gender: Female Account #: [id number] Procedure:                Colonoscopy with cold snare polypectomy x 1 Indications:              Screening for colorectal malignant neoplasm Medicines:                Monitored Anesthesia Care Procedure:                Pre-Anesthesia Assessment:                           - Prior to the procedure, a History and Physical                            was performed, and patient medications and                            allergies were reviewed. The patient's tolerance of                            previous anesthesia was also reviewed. The risks                            and benefits of the procedure and the sedation                            options and risks were discussed with the patient.                            All questions were answered, and informed consent                            was obtained. Prior Anticoagulants: The patient has                            taken no previous anticoagulant or antiplatelet                            agents. ASA Grade Assessment: II - A patient with                            mild systemic disease. After reviewing the risks                            and benefits, the patient was deemed in                            satisfactory condition to undergo the procedure.                           After obtaining informed consent, the colonoscope  was passed under direct vision. Throughout the                            procedure, the patient's blood pressure, pulse, and                            oxygen saturations were monitored continuously. The                            Olympus CF-HQ190L ([id number]) Colonoscope was                            introduced through the anus and advanced to the the                             cecum, identified by appendiceal orifice and                            ileocecal valve. The ileocecal valve, appendiceal                            orifice, and rectum were photographed. The quality                            of the bowel preparation was excellent. The                            colonoscopy was performed without difficulty. The                            patient tolerated the procedure well. The bowel                            preparation used was SUPREP via split dose                            instruction. Scope In: 11:40:05 AM Scope Out: 11:56:04 AM Scope Withdrawal Time: 0 hours 11 minutes 16 seconds  Total Procedure Duration: 0 hours 15 minutes 59 seconds  Findings:                 A 2 mm polyp was found in the descending colon. The                            polyp was sessile. The polyp was removed with a                            cold snare. Resection and retrieval were complete.                           The exam was otherwise without abnormality on                            direct and retroflexion views. Complications:  No immediate complications. Estimated blood loss:                            None. Estimated Blood Loss:     Estimated blood loss: none. Impression:               - One 2 mm polyp in the descending colon, removed                            with a cold snare. Resected and retrieved.                           - The examination was otherwise normal on direct                            and retroflexion views. Recommendation:           - Repeat colonoscopy in 10 years for surveillance.                           - Patient has a contact number available for                            emergencies. The signs and symptoms of potential                            delayed complications were discussed with the                            patient. Return to normal activities tomorrow.                            Written discharge  instructions were provided to the                            patient.                           - Resume previous diet.                           - Continue present medications.                           - Await pathology results. Docia Chuck. Henrene Pastor, MD 06/13/2022 12:02:08 PM This report has been signed electronically.

## 2022-06-13 NOTE — Patient Instructions (Signed)
Impression/Recommendations:  Polyp handout given to patient.  Resume previous diet. Continue present medications. Await pathology results.  Repeat colonoscopy in 10 years for surveillance.  YOU HAD AN ENDOSCOPIC PROCEDURE TODAY AT [facility]:   Refer to the procedure report that was given to you for any specific questions about what was found during the examination.  If the procedure report does not answer your questions, please call your gastroenterologist to clarify.  If you requested that your care partner not be given the details of your procedure findings, then the procedure report has been included in a sealed envelope for you to review at your convenience later.  YOU SHOULD EXPECT: Some feelings of bloating in the abdomen. Passage of more gas than usual.  Walking can help get rid of the air that was put into your GI tract during the procedure and reduce the bloating. If you had a lower endoscopy (such as a colonoscopy or flexible sigmoidoscopy) you may notice spotting of blood in your stool or on the toilet paper. If you underwent a bowel prep for your procedure, you may not have a normal bowel movement for a few days.  Please Note:  You might notice some irritation and congestion in your nose or some drainage.  This is from the oxygen used during your procedure.  There is no need for concern and it should clear up in a day or so.  SYMPTOMS TO REPORT IMMEDIATELY:  Following lower endoscopy (colonoscopy or flexible sigmoidoscopy):  Excessive amounts of blood in the stool  Significant tenderness or worsening of abdominal pains  Swelling of the abdomen that is new, acute  Fever of 100F or higher  For urgent or emergent issues, a gastroenterologist can be reached at any hour by calling [phone]. Do not use MyChart messaging for urgent concerns.    DIET:  We do recommend a small meal at first, but then you may proceed to your regular diet.  Drink plenty of  fluids but you should avoid alcoholic beverages for 24 hours.  ACTIVITY:  You should plan to take it easy for the rest of today and you should NOT DRIVE or use heavy machinery until tomorrow (because of the sedation medicines used during the test).    FOLLOW UP: Our staff will call the number listed on your records the next business day following your procedure.  We will call around 7:15- 8:00 am to check on you and address any questions or concerns that you may have regarding the information given to you following your procedure. If we do not reach you, we will leave a message.     If any biopsies were taken you will be contacted by phone or by letter within the next 1-3 weeks.  Please call us at [phone] if you have not heard about the biopsies in 3 weeks.    SIGNATURES/CONFIDENTIALITY: You and/or your care partner have signed paperwork which will be entered into your electronic medical record.  These signatures attest to the fact that that the information above on your After Visit Summary has been reviewed and is understood.  Full responsibility of the confidentiality of this discharge information lies with you and/or your care-partner.

## 2022-06-13 NOTE — Progress Notes (Signed)
Called to room to assist during endoscopic procedure.  Patient ID and intended procedure confirmed with present staff. Received instructions for my participation in the procedure from the performing physician.  

## 2022-06-13 NOTE — Progress Notes (Signed)
Pt's states no medical or surgical changes since previsit or office visit. 

## 2022-06-13 NOTE — Progress Notes (Signed)
HISTORY OF PRESENT ILLNESS:  Diana Alvarado is a 66 y.o. female who presents today for screening colonoscopy.  Prior examination 2013 was normal  REVIEW OF SYSTEMS:  All non-GI ROS negative .  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 She has a 10.00 pack-year smoking history. She has been exposed to tobacco smoke. She has never used smokeless tobacco. She reports that she does not drink alcohol and does not use drugs.  family history includes Arthritis in an other family member; Cancer in an other family member; Colon cancer in her maternal aunt; Diabetes in an other family member.  Allergies  Allergen Reactions   Aspirin Hives   Sulfonamide Derivatives Hives       PHYSICAL EXAMINATION: Vital signs: BP 127/88   Pulse 86   Temp 98 F (36.7 C)   Resp 11   Ht '5\' 1"'$  (1.549 m)   Wt 160 lb (72.6 kg)   SpO2 98%   BMI 30.23 kg/m  General: Well-developed, well-nourished, no acute distress HEENT: Sclerae are anicteric, conjunctiva pink. Oral mucosa intact Lungs: Clear Heart: Regular Abdomen: soft,  nontender, nondistended, no obvious ascites, no peritoneal signs, normal bowel sounds. No organomegaly. Extremities: No edema Psychiatric: alert and oriented x3. Cooperative      ASSESSMENT:  Colon cancer screening   PLAN:    Screening colonoscopy

## 2022-06-13 NOTE — Progress Notes (Signed)
PT taken to PACU. Monitors in place. VSS. Report given to RN. 

## 2022-06-14 ENCOUNTER — Telehealth: Payer: Self-pay | Admitting: *Deleted

## 2022-06-14 NOTE — Telephone Encounter (Signed)
Left message on f/u call 

## 2022-06-20 ENCOUNTER — Other Ambulatory Visit: Payer: Self-pay | Admitting: Physician Assistant

## 2022-06-21 ENCOUNTER — Encounter: Payer: Self-pay | Admitting: Internal Medicine

## 2022-07-20 ENCOUNTER — Other Ambulatory Visit: Payer: Self-pay | Admitting: Physician Assistant

## 2022-08-01 ENCOUNTER — Other Ambulatory Visit: Payer: Self-pay | Admitting: Physician Assistant

## 2022-08-01 ENCOUNTER — Telehealth: Payer: Self-pay | Admitting: Physician Assistant

## 2022-08-01 NOTE — Telephone Encounter (Signed)
Patient called in for refill on Adderall '30mg'$ . Ph: [phone] [facility] [name]

## 2022-08-01 NOTE — Telephone Encounter (Signed)
Last filled 11/5, due 12/4

## 2022-08-07 ENCOUNTER — Other Ambulatory Visit: Payer: Self-pay

## 2022-08-07 MED ORDER — AMPHETAMINE-DEXTROAMPHETAMINE 30 MG PO TABS: 30.0000 mg | ORAL_TABLET | Freq: Two times a day (BID) | ORAL | 0 refills | Status: AC

## 2022-08-07 NOTE — Telephone Encounter (Signed)
Pended.

## 2022-08-16 ENCOUNTER — Other Ambulatory Visit: Payer: Self-pay | Admitting: Physician Assistant

## 2022-08-16 ENCOUNTER — Telehealth: Payer: Self-pay | Admitting: Physician Assistant

## 2022-08-16 MED ORDER — VARENICLINE TARTRATE (STARTER) 0.5 MG X 11 & 1 MG X 42 PO TBPK: ORAL_TABLET | ORAL | 0 refills | Status: AC

## 2022-08-16 NOTE — Telephone Encounter (Signed)
Pt lvm that she would like to go on the generic of chantix beginner pack. She wants to stop smoking and is wondering if teresa would send [facility] [place]

## 2022-08-16 NOTE — Telephone Encounter (Signed)
Please see message from patient - requesting generic Chantix for smoking cessation.

## 2022-08-16 NOTE — Telephone Encounter (Signed)
Rx sent 

## 2022-08-19 ENCOUNTER — Other Ambulatory Visit: Payer: Self-pay | Admitting: Physician Assistant

## 2022-09-03 ENCOUNTER — Other Ambulatory Visit: Payer: Self-pay | Admitting: Physician Assistant

## 2022-09-06 ENCOUNTER — Ambulatory Visit: Payer: Medicare Other | Admitting: Physician Assistant

## 2022-09-06 NOTE — Telephone Encounter (Signed)
LVM to [facility]. How is it working, does she need a RF?

## 2022-09-08 NOTE — Telephone Encounter (Signed)
Left another VM to RC.  

## 2022-09-14 NOTE — Telephone Encounter (Signed)
Called twice today and a child answers the phone and then hangs up.

## 2022-10-04 ENCOUNTER — Other Ambulatory Visit: Payer: Self-pay | Admitting: Physician Assistant

## 2022-10-04 NOTE — Telephone Encounter (Signed)
Please call to schedule an appt, was a no show last appt.

## 2022-10-06 NOTE — Telephone Encounter (Signed)
Please call to schedule an appt, canceled/no showed last appt.

## 2022-10-06 NOTE — Telephone Encounter (Signed)
Lvm for patient to call and schedule

## 2022-10-12 ENCOUNTER — Ambulatory Visit (INDEPENDENT_AMBULATORY_CARE_PROVIDER_SITE_OTHER): Payer: 59 | Admitting: Podiatry

## 2022-10-12 DIAGNOSIS — L84 Corns and callosities: Secondary | ICD-10-CM | POA: Diagnosis not present

## 2022-10-12 DIAGNOSIS — M2042 Other hammer toe(s) (acquired), left foot: Secondary | ICD-10-CM | POA: Diagnosis not present

## 2022-10-12 DIAGNOSIS — M216X2 Other acquired deformities of left foot: Secondary | ICD-10-CM | POA: Diagnosis not present

## 2022-10-12 NOTE — Progress Notes (Signed)
  Subjective:  Patient ID: Diana Alvarado, female    DOB: [birth year],  MRN: [id number]  Chief Complaint  Patient presents with   Plantar Warts    Left forefoot plantar wart, which started a year ago, patient states it feels like a stepping on a rock when walking,      67 y.o. female presents with the above complaint. History confirmed with patient.  She had treatment for this which sounds like a curettage type debridement.  She says it was left to fill in on its own.  Says it did not help and what she was left with is worse than it was.  Also feels like the toe is sticking up somewhat in the air.  Objective:  Physical Exam: warm, good capillary refill, no trophic changes or ulcerative lesions, normal DP and PT pulses, normal sensory exam, and painful porokeratosis submetatarsal 2 on the left foot, she has lesser hammertoe deformities that are semireducible, prominent metatarsal head plantar submetatarsal 2    Assessment:   1. Callus of foot   2. Hammertoe of left foot   3. Plantar flexed metatarsal bone of left foot      Plan:  Patient was evaluated and treated and all questions answered.  All symptomatic hyperkeratoses were safely debrided with a sterile #15 blade to patient's level of comfort without incident as a courtesy. We discussed preventative and palliative care of these lesions including supportive and accommodative shoegear, padding, prefabricated and custom molded accommodative orthoses, use of a pumice stone and lotions/creams daily.  I recommend she begin using foam and foot callus pads as well as urea cream and salicylic acid.  The lesion is quite large and deep.  We also discussed surgical consideration.  I discussed that excision as well as hammertoe correction metatarsal osteotomy is a possibility but she would need to stop smoking prior to this.  Currently she is smoking a pack per day.  She is starting Chantix soon.  I will see her back in 3 months to  reevaluate.   Return in about 3 months (around 01/10/2023) for follow up on callus / hammertoes.

## 2022-10-12 NOTE — Patient Instructions (Addendum)
Look for foam or felt callus pads (horsehoe pads or aperture pads) on [place], whichever is most comfortable  Urea cream 39% with Salicylic acid 2%

## 2022-12-13 ENCOUNTER — Other Ambulatory Visit: Payer: Self-pay | Admitting: Physician Assistant

## 2022-12-27 ENCOUNTER — Ambulatory Visit (INDEPENDENT_AMBULATORY_CARE_PROVIDER_SITE_OTHER): Payer: 59 | Admitting: Podiatry

## 2022-12-27 ENCOUNTER — Ambulatory Visit (INDEPENDENT_AMBULATORY_CARE_PROVIDER_SITE_OTHER): Payer: 59

## 2022-12-27 ENCOUNTER — Encounter: Payer: Self-pay | Admitting: Podiatry

## 2022-12-27 DIAGNOSIS — M79672 Pain in left foot: Secondary | ICD-10-CM

## 2022-12-27 DIAGNOSIS — M7752 Other enthesopathy of left foot: Secondary | ICD-10-CM

## 2022-12-27 DIAGNOSIS — M2042 Other hammer toe(s) (acquired), left foot: Secondary | ICD-10-CM | POA: Diagnosis not present

## 2022-12-27 DIAGNOSIS — L84 Corns and callosities: Secondary | ICD-10-CM

## 2022-12-27 NOTE — Progress Notes (Signed)
Subjective:   Patient ID: Diana Alvarado, female   DOB: 67 y.o.   MRN: [id number]   HPI Patient states that the lesion came back again very quickly after treated by Dr. Lilian Kapur in February.  Patient stated she completely stopped smoking 2 weeks ago and is can have to have something done with this   ROS      Objective:  Physical Exam  Neurovascular status intact with a severe keratotic lesion subsecond metatarsal left very painful when pressed with the prominence of the metatarsal in the area and digital deformity      Assessment:  Chronic lesion with plantarflexed metatarsal that is causing a lot of pain with digital deformity also noted     Plan:  Reviewed at great length and discussed the possibility for digital fusion possible elevating osteotomy floating osteotomy and possible excision of plantar lesion.  Patient will see Dr. Lilian Kapur in 2 weeks with today courtesy debridement accomplished and he will consider the surgery that he had discussed with her in February  X-rays were taken today indicating digital deformities and lesion directly on the second metatarsal head left

## 2023-01-09 ENCOUNTER — Ambulatory Visit (INDEPENDENT_AMBULATORY_CARE_PROVIDER_SITE_OTHER): Payer: 59 | Admitting: Podiatry

## 2023-01-09 DIAGNOSIS — M216X2 Other acquired deformities of left foot: Secondary | ICD-10-CM

## 2023-01-09 DIAGNOSIS — Z720 Tobacco use: Secondary | ICD-10-CM

## 2023-01-09 DIAGNOSIS — L84 Corns and callosities: Secondary | ICD-10-CM

## 2023-01-09 DIAGNOSIS — M2042 Other hammer toe(s) (acquired), left foot: Secondary | ICD-10-CM | POA: Diagnosis not present

## 2023-01-14 NOTE — Progress Notes (Signed)
  Subjective:  Patient ID: Diana Alvarado, female    DOB: Nov 09, 1955,  MRN: [id number]  Chief Complaint  Patient presents with   Callouses    3 months (around 01/10/2023) for follow up on callus / hammertoes.    67 y.o. female presents with the above complaint. History confirmed with patient.  She returns for follow-up has been able to stop smoking and is ready to have surgery scheduled  Objective:  Physical Exam: warm, good capillary refill, no trophic changes or ulcerative lesions, normal DP and PT pulses, normal sensory exam, and painful porokeratosis submetatarsal 2 on the left foot, she has lesser hammertoe deformities that are semireducible, prominent metatarsal head plantar submetatarsal 2    Assessment:   1. Tobacco use   2. Hammertoe of left foot   3. Plantar flexed metatarsal bone of left foot   4. Callus of foot      Plan:  Patient was evaluated and treated and all questions answered.  So far has not had much success with nonsurgical treatment with offloading pads and salicylic acid.  Discussed surgical treatment options including metatarsal osteotomy and second toe correction.  Would prefer to avoid excision of the lesion if possible.  Discussed risk benefits and potential complications of the procedure including but not limited to  pain, swelling, infection, scar, numbness which may be temporary or permanent, chronic pain, stiffness, nerve pain or damage, wound healing problems, bone healing problems including delayed or non-union.  She understands and wishes to proceed.  Informed consent signed and reviewed.  Noninvasive vascular testing was ordered as well   Surgical plan:  Procedure: -Left foot second hammertoe correction and metatarsal osteotomy  Location: -[facility]  Anesthesia plan: -IV sedation with local  Postoperative pain plan: - Tylenol 1000 mg every 6 hours, ibuprofen 600 mg every 6 hours, gabapentin 300 mg every 8 hours x5 days, oxycodone 5 mg 1-2 tabs every  6 hours only as needed  DVT prophylaxis: -None required  WB Restrictions / DME needs: -WBAT in CAM boot postop     No follow-ups on file.

## 2023-01-22 ENCOUNTER — Ambulatory Visit (HOSPITAL_COMMUNITY): Payer: 59

## 2023-01-25 ENCOUNTER — Ambulatory Visit (HOSPITAL_COMMUNITY)
Admission: RE | Admit: 2023-01-25 | Discharge: 2023-01-25 | Disposition: A | Discharge status: Home / Self Care | Payer: 59 | Source: Ambulatory Visit | Attending: Podiatry | Admitting: Podiatry

## 2023-01-25 DIAGNOSIS — Z720 Tobacco use: Secondary | ICD-10-CM

## 2023-01-25 DIAGNOSIS — I70201 Unspecified atherosclerosis of native arteries of extremities, right leg: Secondary | ICD-10-CM

## 2023-01-25 DIAGNOSIS — Z01810 Encounter for preprocedural cardiovascular examination: Secondary | ICD-10-CM

## 2023-01-25 LAB — VAS US PAD ABI
Left ABI: 1.12
Right ABI: 0.94

## 2023-03-07 ENCOUNTER — Ambulatory Visit (INDEPENDENT_AMBULATORY_CARE_PROVIDER_SITE_OTHER): Payer: 59 | Admitting: Podiatry

## 2023-03-07 ENCOUNTER — Encounter: Payer: Self-pay | Admitting: Podiatry

## 2023-03-07 DIAGNOSIS — M2042 Other hammer toe(s) (acquired), left foot: Secondary | ICD-10-CM

## 2023-03-07 DIAGNOSIS — M216X2 Other acquired deformities of left foot: Secondary | ICD-10-CM

## 2023-03-07 DIAGNOSIS — B070 Plantar wart: Secondary | ICD-10-CM | POA: Diagnosis not present

## 2023-03-07 NOTE — Progress Notes (Signed)
Subjective:   Patient ID: Diana Alvarado, female   DOB: 67 y.o.   MRN: [id number]   HPI Patient presents with severe lesion plantar aspect left that is most likely to require surgery in the fall and is very tender currently   ROS      Objective:  Physical Exam  Neurovascular status intact with keratotic lesion submetatarsal left that upon debridement did also show some pinpoint bleeding within it     Assessment:  This is a condition that has a combination of structural deformity lesion formation possible verruca plantaris     Plan:  Try to give her relief I want to treat this is a verruca and see if he can get some reduction of the stress.  Sharp sterile debridement accomplished apply chemical agent to create immune response with sterile dressing explained what to do if blistering were to occur reappoint as needed and Dr. Lilian Kapur tentatively has her scheduled for September

## 2023-04-17 ENCOUNTER — Ambulatory Visit: Payer: 59 | Admitting: Podiatry

## 2023-04-17 ENCOUNTER — Telehealth: Payer: Self-pay | Admitting: Urology

## 2023-04-17 DIAGNOSIS — B070 Plantar wart: Secondary | ICD-10-CM

## 2023-04-17 NOTE — Telephone Encounter (Signed)
DOS - 05/18/23  METATARSAL OSTEOTOMY 2ND LEFT --- 28308 HAMMERTOE REPAIR 2ND LEFT --- [id number]  [facility] EFFECTIVE DATE - 09/04/22  PER [employer] WEBSITE FOR CPT CODES 16109 AND [phone] Notification or Prior Authorization is not required for the requested services You are not required to submit a notification/prior authorization based on the information provided. If you have general questions about the prior authorization requirements, visit [email or web address] > Clinician Resources > Advance and Admission Notification Requirements. The number above acknowledges your notification. Please write this reference number down for future reference. If you would like to request an organization determination, please call us at [phone].   Decision ID #: [id number]

## 2023-04-22 ENCOUNTER — Encounter: Payer: Self-pay | Admitting: Podiatry

## 2023-04-22 NOTE — Progress Notes (Signed)
  Subjective:  Patient ID: Diana Alvarado, female    DOB: [birth year],  MRN: [id number]  Chief Complaint  Patient presents with   Callouses    Callouse on the bottom of the left foot.    67 y.o. female presents with the above complaint. History confirmed with patient.  She returns for follow-up, it has been feeling a little bit better, she had treated by Dr. Charlsie Merles recently and this helped quite a bit and is not hurting nearly as much.  Objective:  Physical Exam: warm, good capillary refill, no trophic changes or ulcerative lesions, normal DP and PT pulses, normal sensory exam, and painful verrucous appearing lesion submetatarsal 2 on the left foot, she has lesser hammertoe deformities that are semireducible, prominent metatarsal head plantar submetatarsal 2    Assessment:   1. Plantar wart      Plan:  Patient was evaluated and treated and all questions answered.  She had recent treatment with Cantharone that has helped her quite a bit.  I recommend we continue a repeat treatment, she has scheduled upcoming surgery next month to alleviate the structural portions of this but we might be able to avoid this if she has continued relief of the skin lesion.  I recommended repeat treatment Cantharone today.  The lesion was debrided of the nonviable tissue and hyperkeratosis.  Following this and application of Cantharone was applied with an offloading pad and bandage.  It was advised to leave on for the appropriate time and then she will take it off wash with soap and water.  I will see her back prior to surgery and we will reassess if we should like to proceed or not.   Return in about 4 weeks (around 05/15/2023).

## 2023-05-15 ENCOUNTER — Ambulatory Visit (INDEPENDENT_AMBULATORY_CARE_PROVIDER_SITE_OTHER): Payer: 59 | Admitting: Podiatry

## 2023-05-15 ENCOUNTER — Encounter: Payer: Self-pay | Admitting: Podiatry

## 2023-05-15 DIAGNOSIS — B070 Plantar wart: Secondary | ICD-10-CM | POA: Diagnosis not present

## 2023-05-15 DIAGNOSIS — M216X2 Other acquired deformities of left foot: Secondary | ICD-10-CM

## 2023-05-15 NOTE — Patient Instructions (Signed)
Look for salicylic 40% cream or ointment and apply to the thickened dry skin / calluses. This can be bought over the counter, at a pharmacy or online such as [employer].

## 2023-05-20 NOTE — Progress Notes (Signed)
Subjective:  Patient ID: Diana Alvarado, female    DOB: [birth year],  MRN: [id number]  Chief Complaint  Patient presents with   Plantar Warts    Patient is here for evaluation of wart , and also to not move forward with surgery as her foot is feeling much better    67 y.o. female presents with the above complaint. History confirmed with patient.  She is doing well would like to wait on surgery now   Objective:  Physical Exam: warm, good capillary refill, no trophic changes or ulcerative lesions, normal DP and PT pulses, normal sensory exam, and resolved lesion submetatarsal 2 on the left foot, she has lesser hammertoe deformities that are semireducible, prominent metatarsal head plantar submetatarsal 2    Assessment:   1. Plantar wart   2. Plantar flexed metatarsal bone of left foot      Plan:  Patient was evaluated and treated and all questions answered.  Improved. She will continue to use sal acid at home and f/u with me PRN and we can revisit surgery in the future if it worsens or returns.   No follow-ups on file.

## 2023-05-24 ENCOUNTER — Encounter: Payer: 59 | Admitting: Podiatry

## 2023-06-07 ENCOUNTER — Encounter: Payer: 59 | Admitting: Podiatry

## 2023-06-28 ENCOUNTER — Encounter: Payer: 59 | Admitting: Podiatry

## 2024-01-08 ENCOUNTER — Ambulatory Visit: Admitting: Podiatry

## 2024-01-15 ENCOUNTER — Ambulatory Visit: Admitting: Podiatry

## 2024-01-17 ENCOUNTER — Emergency Department (HOSPITAL_BASED_OUTPATIENT_CLINIC_OR_DEPARTMENT_OTHER)
Admission: EM | Admit: 2024-01-17 | Discharge: 2024-01-17 | Disposition: A | Discharge status: Home / Self Care | Attending: Emergency Medicine | Admitting: Emergency Medicine

## 2024-01-17 ENCOUNTER — Encounter (HOSPITAL_BASED_OUTPATIENT_CLINIC_OR_DEPARTMENT_OTHER): Payer: Self-pay | Admitting: Emergency Medicine

## 2024-01-17 ENCOUNTER — Other Ambulatory Visit: Payer: Self-pay

## 2024-01-17 DIAGNOSIS — B0222 Postherpetic trigeminal neuralgia: Secondary | ICD-10-CM | POA: Diagnosis not present

## 2024-01-17 DIAGNOSIS — Z7951 Long term (current) use of inhaled steroids: Secondary | ICD-10-CM | POA: Diagnosis not present

## 2024-01-17 DIAGNOSIS — J449 Chronic obstructive pulmonary disease, unspecified: Secondary | ICD-10-CM | POA: Diagnosis not present

## 2024-01-17 DIAGNOSIS — R21 Rash and other nonspecific skin eruption: Secondary | ICD-10-CM | POA: Diagnosis present

## 2024-01-17 MED ORDER — VALACYCLOVIR HCL 500 MG PO TABS
1000.0000 mg | ORAL_TABLET | Freq: Once | ORAL | Status: AC
  Administered 2024-01-17: 1000 mg via ORAL
  Filled 2024-01-17: qty 2

## 2024-01-17 MED ORDER — VALACYCLOVIR HCL 1 G PO TABS: 1000.0000 mg | ORAL_TABLET | Freq: Three times a day (TID) | ORAL | 0 refills | Status: AC

## 2024-01-17 MED ORDER — LIDOCAINE 5 % EX OINT: 1.0000 | TOPICAL_OINTMENT | CUTANEOUS | 0 refills | Status: AC | PRN

## 2024-01-17 NOTE — ED Provider Notes (Signed)
 [facility] Provider Note   CSN: [id number] Arrival date & time: 01/17/24  1001     History  Chief Complaint  Patient presents with   Rash    Diana Alvarado is a 68 y.o. female with history of COPD, OSA, presents with concern for a painful rash over her left eyebrow and left side of the forehead that started about 4 days ago.  She denies any rash in her nose, ears, mouth, or elsewhere.  Denies any new soaps or lotions, new medications or foods. Denies any systemic symptoms such as fever or chills.    Rash      Home Medications Prior to Admission medications   Medication Sig Start Date End Date Taking? Authorizing Provider  lidocaine (XYLOCAINE) 5 % ointment Apply 1 Application topically as needed. Do not apply to eyelid 01/17/24  Yes Rexie Catena, PA-C  valACYclovir (VALTREX) 1000 MG tablet Take 1 tablet (1,000 mg total) by mouth 3 (three) times daily for 10 days. 01/17/24 01/27/24 Yes Rexie Catena, PA-C  acetaminophen  (TYLENOL ) 500 MG tablet Take 500 mg by mouth every 6 (six) hours as needed for mild pain or moderate pain.    [provider]  ALPRAZolam  (XANAX ) 0.5 MG tablet Take 0.5-1 tablets (0.25-0.5 mg total) by mouth 2 (two) times daily as needed. Must last 30 days 05/05/22   Marvia Slocumb T, PA-C  amphetamine -dextroamphetamine  (ADDERALL) 30 MG tablet Take 1 tablet by mouth 2 (two) times daily with breakfast and lunch. 07/08/22   Marvia Slocumb T, PA-C  amphetamine -dextroamphetamine  (ADDERALL) 30 MG tablet Take 1 tablet by mouth 2 (two) times daily with breakfast and lunch. 05/11/22   Marvia Slocumb T, PA-C  amphetamine -dextroamphetamine  (ADDERALL) 30 MG tablet Take 1 tablet by mouth 2 (two) times daily with breakfast and lunch. 08/07/22   Marvia Slocumb T, PA-C  ARIPiprazole  (ABILIFY ) 10 MG tablet TAKE 1 TABLET(10 MG) BY MOUTH DAILY 08/21/22   Chet Cota, Ammon Bales T, PA-C  docusate sodium  (COLACE) 100 MG capsule Take 1 capsule (100 mg  total) by mouth 2 (two) times daily. 11/26/20   Elisa Guest, PA-C  DULoxetine  (CYMBALTA ) 60 MG capsule TAKE 1 CAPSULE(60 MG) BY MOUTH DAILY 07/20/22   Marvia Slocumb T, PA-C  fluticasone  furoate-vilanterol (BREO ELLIPTA ) 100-25 MCG/INH AEPB Inhale 1 puff into the lungs daily at 12 noon.    [provider]  hydrOXYzine  (ATARAX /VISTARIL ) 10 MG tablet TAKE 1 TO 3 TABLETS(10 TO 30 MG) BY MOUTH THREE TIMES DAILY AS NEEDED FOR ANXIETY 10/28/20   Hurst, Ammon Bales T, PA-C  ipratropium-albuterol  (DUONEB) 0.5-2.5 (3) MG/3ML SOLN Take 3 mLs by nebulization every 6 (six) hours as needed. 12/31/20   Ezenduka, Nkeiruka J, MD  NARCAN  4 MG/0.1ML LIQD nasal spray kit Place 4 mg into the nose once. 06/17/20   [provider]  ondansetron  (ZOFRAN ) 4 MG tablet Take 1 tablet (4 mg total) by mouth every 6 (six) hours as needed for nausea. 11/26/20   Elisa Guest, PA-C  Oxycodone  HCl 10 MG TABS Take 10 mg by mouth 5 (five) times daily as needed. 10/30/21   [provider]  senna (SENOKOT) 8.6 MG TABS tablet Take 1 tablet (8.6 mg total) by mouth 2 (two) times daily. 11/26/20   Elisa Guest, PA-C  traZODone  (DESYREL ) 100 MG tablet TAKE 1 TO 2 TABLETS(100 TO 200 MG) BY MOUTH AT BEDTIME AS NEEDED FOR SLEEP 06/20/22   Marvia Slocumb T, PA-C  Varenicline  Tartrate, Starter, (CHANTIX  STARTING  MONTH PAK) 0.5 MG X 11 & 1 MG X 42 TBPK Use starter pack as directed 08/16/22   Marvia Slocumb T, PA-C      Allergies    Aspirin and Sulfonamide derivatives    Review of Systems   Review of Systems  Skin:  Positive for rash.    Physical Exam Updated Vital Signs BP (!) 143/92 (BP Location: Left Arm)   Pulse 87   Temp 98.1 F (36.7 C) (Oral)   Resp 17   Wt 74.4 kg   SpO2 97%   BMI 30.99 kg/m  Physical Exam Vitals and nursing note reviewed.  Constitutional:      Appearance: Normal appearance.  HENT:     Head: Atraumatic.     Right Ear: Tympanic membrane normal.     Left Ear: Tympanic membrane  normal.     Ears:     Comments: No rash noted in the ear canal or on external aspect of ear Pulmonary:     Effort: Pulmonary effort is normal.  Skin:    Comments: Crusted over vesicles over the left eyebrow. Erythematous nodules on left side of forehead into hairline  No involvement of oral mucosa, or nose. No rash on chest, back, arms, or legs  Neurological:     General: No focal deficit present.     Mental Status: She is alert.  Psychiatric:        Mood and Affect: Mood normal.        Behavior: Behavior normal.     ED Results / Procedures / Treatments   Labs (all labs ordered are listed, but only abnormal results are displayed) Labs Reviewed - No data to display  EKG None  Radiology No results found.  Procedures Procedures    Medications Ordered in ED Medications  valACYclovir (VALTREX) tablet 1,000 mg (1,000 mg Oral Given 01/17/24 1123)    ED Course/ Medical Decision Making/ A&P                                 Medical Decision Making Risk Prescription drug management.     Differential diagnosis includes but is not limited to herpes zoster, contact dermatitis, cellulitis, SJS, medication reaction  ED Course:  Upon initial evaluation, patient is well-appearing with stable vital signs aside from elevated blood pressure 143/92.  Has custody of her vesicles on the left eyebrow, and mildly erythematous nodules on the left side of forehead into the left side of the hair line.  No rash elsewhere including mucosal surfaces, nose, or ear.  She denies any new medications, soaps, lotions, and given appearance, lower concern for a contact dermatitis.  Given unilateral nature in V1 distribution and painful nature, suspect herpes zoster.  Will treat with valacyclovir  Medications Given: Valacyclovir for herpes zoster   Impression: Herpes zoster rash  Disposition:  The patient was discharged home with instructions to take 10-day course of valacyclovir as prescribed  for the zoster rash.  Topical lidocaine as needed for pain.  Follow-up with PCP within the next week for recheck of symptoms. Return precautions given.     This chart was dictated using voice recognition software, Dragon. Despite the best efforts of this provider to proofread and correct errors, errors may still occur which can change documentation meaning.          Final Clinical Impression(s) / ED Diagnoses Final diagnoses:  Trigeminal herpes zoster    Rx /  DC Orders ED Discharge Orders          Ordered    valACYclovir (VALTREX) 1000 MG tablet  3 times daily        01/17/24 1112    lidocaine (XYLOCAINE) 5 % ointment  As needed        01/17/24 1112              Rexie Catena, PA-C 01/17/24 1218    Hershel Los, MD 01/17/24 1311

## 2024-01-17 NOTE — Discharge Instructions (Addendum)
 Your rash today appears to be from shingles.  You are being treated with an antiviral called valacyclovir.  Please take 1000 mg 3 times daily for the next 10 days. You were given a first dose here today.  You have been prescribed a topical lidocaine cream to help with pain.  You may apply this to the rash to 3 times daily to help with pain.  Do not apply to the eyelid.  Follow-up with your PCP within the next week for a recheck of your symptoms.  Return to the ER for any worsening pain, fevers, any other new or concerning symptoms

## 2024-01-17 NOTE — ED Triage Notes (Signed)
 Rash around eye , stinging and painful to forehead . X 4 days . "Feels bad all over ". Reports had shingle shot 6 months-1 year ago

## 2024-02-07 ENCOUNTER — Ambulatory Visit (INDEPENDENT_AMBULATORY_CARE_PROVIDER_SITE_OTHER)

## 2024-02-07 ENCOUNTER — Ambulatory Visit (INDEPENDENT_AMBULATORY_CARE_PROVIDER_SITE_OTHER): Admitting: Podiatry

## 2024-02-07 ENCOUNTER — Encounter: Payer: Self-pay | Admitting: Podiatry

## 2024-02-07 VITALS — Ht 61.0 in | Wt 164.0 lb

## 2024-02-07 DIAGNOSIS — M2042 Other hammer toe(s) (acquired), left foot: Secondary | ICD-10-CM

## 2024-02-07 DIAGNOSIS — M216X2 Other acquired deformities of left foot: Secondary | ICD-10-CM | POA: Diagnosis not present

## 2024-02-07 NOTE — Progress Notes (Signed)
  Subjective:  Patient ID: Diana Alvarado, female    DOB: 03/06/1956,  MRN: [id number]  Chief Complaint  Patient presents with   Foot Pain    Pt is here due to left foot pain, she states the pain is back and its worst then ever, states she was to have surgery a year ago and decline, thinks she may need it now.    68 y.o. female presents with the above complaint. History confirmed with patient.  She returns for follow-up, the lesion has returned starting to have it on her right foot as well she would like to proceed with surgery.  She only smokes occasionally not daily now  Objective:  Physical Exam: warm, good capillary refill, no trophic changes or ulcerative lesions, normal DP and PT pulses, normal sensory exam, and painful porokeratosis submetatarsal 2 on the left foot, she has lesser hammertoe deformities that are reducible of 3 4 and 5, prominent metatarsal head plantar submetatarsal 2    Assessment:   1. Hammertoe of left foot   2. Plantar flexed metatarsal bone of left foot      Plan:  Patient was evaluated and treated and all questions answered.  We again reviewed treatment options.  Discussed surgical treatment options including metatarsal osteotomy and second toe correction.  Would prefer to avoid excision of the lesion if possible.  Discussed risk benefits and potential complications of the procedure including but not limited to  pain, swelling, infection, scar, numbness which may be temporary or permanent, chronic pain, stiffness, nerve pain or damage, wound healing problems, bone healing problems including delayed or non-union.  She understands and wishes to proceed.  Informed consent signed and reviewed.  ABI last year was adequate.  Recovery process including the period of weightbearing in the cam boot for 6 weeks was also reviewed.   Surgical plan:  Procedure: -Left foot second hammertoe correction and metatarsal osteotomy, flexor tenotomy 3rd, 4th and 5th  toes  Location: -[facility]  Anesthesia plan: -IV sedation with local  Postoperative pain plan: - Tylenol  1000 mg every 6 hours, ibuprofen 600 mg every 6 hours, gabapentin  300 mg every 8 hours x5 days, oxycodone  5 mg 1-2 tabs every 6 hours only as needed  DVT prophylaxis: -None required  WB Restrictions / DME needs: -WBAT in CAM boot postop     No follow-ups on file.

## 2024-02-14 ENCOUNTER — Telehealth: Payer: Self-pay | Admitting: Podiatry

## 2024-02-14 NOTE — Telephone Encounter (Signed)
 Received surgical consent forms.  Called and left message for pt to call me back to get her surgery scheduled.

## 2024-02-22 ENCOUNTER — Ambulatory Visit (HOSPITAL_BASED_OUTPATIENT_CLINIC_OR_DEPARTMENT_OTHER): Attending: Student | Admitting: Physical Therapy

## 2024-02-22 NOTE — Therapy (Incomplete)
 OUTPATIENT PHYSICAL THERAPY LOWER EXTREMITY EVALUATION   Patient Name: Diana Alvarado MRN: [id number] DOB:07-16-1956, 68 y.o., female Today's Date: 02/22/2024  END OF SESSION:   Past Medical History:  Diagnosis Date   ADHD    Anxiety 11/17/2011   Asthma 04/19/2011   Chronic kidney disease    Stage III   Chronic LBP 04/19/2011   Depression 04/19/2011   DJD (degenerative joint disease), cervical 04/19/2011   Emphysema, unspecified (HCC)    Lumbar disc disease 04/19/2011   Pulmonary nodule    Splenic artery aneurysm (HCC)    UTI (lower urinary tract infection)    Past Surgical History:  Procedure Laterality Date   ABDOMINAL HYSTERECTOMY  1997   APPENDECTOMY     CHOLECYSTECTOMY     COLONOSCOPY     gall bladder  2005   NECK SURGERY  1995   ruptured disc  1992   repaired ruptured disc, neck   SHOULDER SURGERY Right    march 2023   TOTAL HIP ARTHROPLASTY Right 11/25/2020   Procedure: TOTAL HIP ARTHROPLASTY ANTERIOR APPROACH;  Surgeon: Adonica Hoose, MD;  Location: [facility];  Service: Orthopedics;  Laterality: Right;   Patient Active Problem List   Diagnosis Date Noted   Pneumonia of both lungs due to infectious organism 12/28/2020   Asthma, chronic, unspecified asthma severity, with acute exacerbation 12/28/2020   Osteoarthritis of right hip 11/25/2020   Chronic lumbar radicular pain (Left) 04/29/2018   Cervicalgia (Bilateral) 04/29/2018   Osteoarthritis of hands (Bilateral) 04/29/2018   Cervical fusion syndrome 04/29/2018   History of fusion of cervical spine 04/29/2018   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   Methadone use 04/29/2018   History of substance use disorder 04/29/2018   Cervical facet arthropathy (Bilateral) 04/29/2018   Cervical facet syndrome (Bilateral) 04/29/2018   Sacroiliac joint dysfunction (Left) 04/29/2018   Chronic sacroiliac joint pain (Left) 04/29/2018   Lumbar facet syndrome (Bilateral) (L>R) 04/29/2018   Lumbar facet arthropathy (Bilateral) 04/29/2018    Spondylosis without myelopathy or radiculopathy, lumbosacral region 04/29/2018   Other specified dorsopathies, sacral and sacrococcygeal region 04/29/2018   DDD (degenerative disc disease), lumbar 04/29/2018   Osteoarthritis of hips (Bilateral) 04/29/2018   Chronic myofascial pain 04/29/2018   Neurogenic pain 04/29/2018   Chronic low back pain (Primary Area of Pain) (Bilateral) (L>R) w/ sciatica (Left) 03/26/2018   Chronic lower extremity pain (Secondary Area of Pain) (Bilateral) (L>R) 03/26/2018   Chronic neck pain (Tertiary Area of Pain) (Bilateral) 03/26/2018   Chronic hand pain (Fourth Area of Pain) (Bilateral) 03/26/2018   Chronic hip pain (Left) 03/26/2018   Chronic pain syndrome 03/26/2018   Long term current use of opiate analgesic 03/26/2018   Pharmacologic therapy 03/26/2018   Disorder of skeletal system 03/26/2018   Problems influencing health status 03/26/2018   Impingement syndrome of shoulder region (Right) 02/04/2018   Splenic artery aneurysm (HCC) 09/12/2016   Insomnia 12/08/2013   Ganglion cyst 12/24/2012   Protrusion of cervical intervertebral disc 12/24/2012   Cigarette nicotine  dependence without complication 06/05/2012   Personal history of drug dependence (HCC) 06/05/2012   Cervical post-laminectomy syndrome 02/06/2012   Anxiety state 11/17/2011   Asthma 04/19/2011   Preventative health care 04/19/2011   DDD (degenerative disc disease), cervical 04/19/2011   Major depression 03/04/2009   Attention deficit disorder 03/04/2009   Generalized anxiety disorder 03/04/2009    PCP: Tristan Furlough, NP   REFERRING PROVIDER:  Harman Lightning, PA-C     REFERRING DIAG:  Diagnosis  M70.51 (ICD-10-CM) -  Other bursitis of knee, right knee    THERAPY DIAG:  No diagnosis found.  Rationale for Evaluation and Treatment: Rehabilitation  ONSET DATE: ***  SUBJECTIVE:   SUBJECTIVE STATEMENT: ***  PERTINENT HISTORY: *** PAIN:  Are you having pain? Yes:  NPRS scale: *** Pain location: *** Pain description: *** Aggravating factors: *** Relieving factors: ***  PRECAUTIONS: {Therapy precautions:24002}  RED FLAGS: {PT Red Flags:29287}   WEIGHT BEARING RESTRICTIONS: {Yes ***/No:24003}  FALLS:  Has patient fallen in last 6 months? {fallsyesno:27318}  LIVING ENVIRONMENT: Lives with: {[facility] lives with:25569::lives with their family} Lives in: {Lives in:25570} Stairs: {opstairs:27293} Has following equipment at home: {Assistive devices:23999}  OCCUPATION: ***  PLOF: {PLOF:24004}  PATIENT GOALS: ***   OBJECTIVE:  Note: Objective measures were completed at Evaluation unless otherwise noted.  DIAGNOSTIC FINDINGS: N/A  PATIENT SURVEYS:  LEFS  Extreme difficulty/unable (0), Quite a bit of difficulty (1), Moderate difficulty (2), Little difficulty (3), No difficulty (4) Survey date:    Any of your usual work, housework or school activities   2. Usual hobbies, recreational or sporting activities   3. Getting into/out of the bath   4. Walking between rooms   5. Putting on socks/shoes   6. Squatting    7. Lifting an object, like a bag of groceries from the floor   8. Performing light activities around your home   9. Performing heavy activities around your home   10. Getting into/out of a car   11. Walking 2 blocks   12. Walking 1 mile   13. Going up/down 10 stairs (1 flight)   14. Standing for 1 hour   15.  sitting for 1 hour   16. Running on even ground   17. Running on uneven ground   18. Making sharp turns while running fast   19. Hopping    20. Rolling over in bed   Score total:  ***    SENSATION: WFL     MUSCLE LENGTH: Positive Ely's    POSTURE: No Significant postural limitations   ****   GAIT: Comments: ***   AROM R  eval L  eval  Hip flexion    Hip extension    Hip abduction    Hip adduction    Hip internal rotation    Hip external rotation    Knee flexion    Knee extension    Ankle  dorsiflexion    Ankle plantarflexion      Ankle inversion      Ankle eversion       (Blank rows = not tested)      MMT Right 5/9 Left 5/9  Hip flexion      Hip extension      Hip abduction      Hip adduction      Hip internal rotation      Hip external rotation      Knee flexion 29.0 29.7  Knee extension 37.8 @ 45 26.1 @ 90 34.2  Ankle dorsiflexion      Ankle plantarflexion      Ankle inversion      Ankle eversion       (Blank rows = not tested)   TODAY'S TREATMENT:  DATE:        PATIENT EDUCATION:  Education details: MOI, diagnosis, prognosis, anatomy, exercise progression, DOMS expectations, muscle firing,  envelope of function, HEP, POC Person educated: Patient Education method: Explanation, VC, handouts Education comprehension: verbalized understanding, requires further education   HOME EXERCISE PROGRAM:       CLINICAL IMPRESSION: Patient is a 68 y.o. female who was seen today for physical therapy evaluation and treatment for c/c of R knee pain. Pt's s/s appear consistent with ***.  No internal derangement or ligamentous instability noted with clinical testing. Clinical testing in NWB position ***. Pt's pain is minimally sensitive and irritable with movement. Plan to continue with R quad strength, edema management,  and ROM at future sessions. Pt would benefit from continued skilled therapy in order to reach goals and maximize functional L LE strength and ROM for full return to normal ADL and occupation.    OBJECTIVE IMPAIRMENTS decreased activity tolerance, decreased mobility, difficulty walking, decreased ROM, decreased strength, hypomobility, increased edema, increased muscle spasms, impaired flexibility, improper body mechanics, and pain.    ACTIVITY LIMITATIONS cleaning, community activity, driving, meal prep, occupation, laundry, yard  work, shopping, school.    PERSONAL FACTORS  Age, fitness, and 1-2 comorbidities  are also affecting patient's functional outcome.     REHAB POTENTIAL: Good   CLINICAL DECISION MAKING: Stable/uncomplicated   EVALUATION COMPLEXITY: Low     GOALS:     SHORT TERM GOALS: Target date: 04/04/2024       Pt will become independent with HEP in order to demonstrate synthesis of PT education.     Goal status: INITIAL   2. Pt will have an at least 9 pt improvement in LEFS measure in order to demonstrate MCID improvement in daily function.     Goal status: INITIAL   3.  Pt will be able to demonstrate at least 10lb increase in HHD knee extension measure at 90 deg of flexion in order to demonstrate functional improvement in R knee extensor strength for improvement in functional mobility, gait quality, and community ambulation.     Goal status: INITIAL       LONG TERM GOALS: Target date: 05/22/2024         Pt  will become independent with final HEP in order to demonstrate synthesis of PT education.     Goal status: INITIAL   2. Pt will have an at least 18 pt improvement in LEFS measure in order to demonstrate MCID improvement in daily function.     Goal status: INITIAL   3.  Pt will be able to lift/squat/hold >30 lbs in order to demonstrate functional improvement in R  LE strength for return to PLOF and occupation.    Goal status: INITIAL   4.  Pt will be able to demonstrate/report ability to walk 30 mins on varying terrain without pain in order to demonstrate functional improvement and tolerance to exercise and community mobility.     Goal status: INITIAL     PLAN: PT FREQUENCY: 1-2x/week   PT DURATION: 12 weeks (plan to DC by 8)   PLANNED INTERVENTIONS: Therapeutic exercises, Therapeutic activity, Neuromuscular re-education, Balance training, Gait training, Patient/Family education, Joint manipulation, Joint mobilization, Stair training, Orthotic/Fit training, DME  instructions, Aquatic Therapy, Dry Needling, Electrical stimulation, Spinal manipulation, Spinal mobilization, Cryotherapy, Moist heat, Compression bandaging, scar mobilization, Taping, Vasopneumatic device, Traction, Ultrasound, Ionotophoresis 4mg /ml Dexamethasone , and Manual therapy   PLAN FOR NEXT SESSION: review HEP, progress ROM, R knee TKE, R quad  strength       Silver Dross, PT 02/22/2024, 9:49 AM

## 2024-03-19 ENCOUNTER — Ambulatory Visit (HOSPITAL_BASED_OUTPATIENT_CLINIC_OR_DEPARTMENT_OTHER): Attending: Student | Admitting: Physical Therapy

## 2024-03-19 NOTE — Therapy (Incomplete)
 OUTPATIENT PHYSICAL THERAPY LOWER EXTREMITY EVALUATION   Patient Name: Diana Alvarado MRN: [id number] DOB:02/11/1956, 68 y.o., female Today's Date: 03/19/2024  END OF SESSION:   Past Medical History:  Diagnosis Date   ADHD    Anxiety 11/17/2011   Asthma 04/19/2011   Chronic kidney disease    Stage III   Chronic LBP 04/19/2011   Depression 04/19/2011   DJD (degenerative joint disease), cervical 04/19/2011   Emphysema, unspecified (HCC)    Lumbar disc disease 04/19/2011   Pulmonary nodule    Splenic artery aneurysm (HCC)    UTI (lower urinary tract infection)    Past Surgical History:  Procedure Laterality Date   ABDOMINAL HYSTERECTOMY  1997   APPENDECTOMY     CHOLECYSTECTOMY     COLONOSCOPY     gall bladder  2005   NECK SURGERY  1995   ruptured disc  1992   repaired ruptured disc, neck   SHOULDER SURGERY Right    march 2023   TOTAL HIP ARTHROPLASTY Right 11/25/2020   Procedure: TOTAL HIP ARTHROPLASTY ANTERIOR APPROACH;  Surgeon: Fidel Rogue, MD;  Location: [facility];  Service: Orthopedics;  Laterality: Right;   Patient Active Problem List   Diagnosis Date Noted   Pneumonia of both lungs due to infectious organism 12/28/2020   Asthma, chronic, unspecified asthma severity, with acute exacerbation 12/28/2020   Osteoarthritis of right hip 11/25/2020   Chronic lumbar radicular pain (Left) 04/29/2018   Cervicalgia (Bilateral) 04/29/2018   Osteoarthritis of hands (Bilateral) 04/29/2018   Cervical fusion syndrome 04/29/2018   History of fusion of cervical spine 04/29/2018   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   Methadone use 04/29/2018   History of substance use disorder 04/29/2018   Cervical facet arthropathy (Bilateral) 04/29/2018   Cervical facet syndrome (Bilateral) 04/29/2018   Sacroiliac joint dysfunction (Left) 04/29/2018   Chronic sacroiliac joint pain (Left) 04/29/2018   Lumbar facet syndrome (Bilateral) (L>R) 04/29/2018   Lumbar facet arthropathy (Bilateral) 04/29/2018    Spondylosis without myelopathy or radiculopathy, lumbosacral region 04/29/2018   Other specified dorsopathies, sacral and sacrococcygeal region 04/29/2018   DDD (degenerative disc disease), lumbar 04/29/2018   Osteoarthritis of hips (Bilateral) 04/29/2018   Chronic myofascial pain 04/29/2018   Neurogenic pain 04/29/2018   Chronic low back pain (Primary Area of Pain) (Bilateral) (L>R) w/ sciatica (Left) 03/26/2018   Chronic lower extremity pain (Secondary Area of Pain) (Bilateral) (L>R) 03/26/2018   Chronic neck pain (Tertiary Area of Pain) (Bilateral) 03/26/2018   Chronic hand pain (Fourth Area of Pain) (Bilateral) 03/26/2018   Chronic hip pain (Left) 03/26/2018   Chronic pain syndrome 03/26/2018   Long term current use of opiate analgesic 03/26/2018   Pharmacologic therapy 03/26/2018   Disorder of skeletal system 03/26/2018   Problems influencing health status 03/26/2018   Impingement syndrome of shoulder region (Right) 02/04/2018   Splenic artery aneurysm (HCC) 09/12/2016   Insomnia 12/08/2013   Ganglion cyst 12/24/2012   Protrusion of cervical intervertebral disc 12/24/2012   Cigarette nicotine  dependence without complication 06/05/2012   Personal history of drug dependence (HCC) 06/05/2012   Cervical post-laminectomy syndrome 02/06/2012   Anxiety state 11/17/2011   Asthma 04/19/2011   Preventative health care 04/19/2011   DDD (degenerative disc disease), cervical 04/19/2011   Major depression 03/04/2009   Attention deficit disorder 03/04/2009   Generalized anxiety disorder 03/04/2009    PCP: Bobie Setters  REFERRING PROVIDER: Leigh Valery RAMAN, PA-C   REFERRING DIAG: M70.51 (ICD-10-CM) - Other bursitis of knee, right knee   THERAPY  DIAG:  No diagnosis found.  Rationale for Evaluation and Treatment: {HABREHAB:27488}  ONSET DATE: ***  SUBJECTIVE:   SUBJECTIVE STATEMENT: ***  PERTINENT HISTORY: Pes bursitis bilateral knees.  Bilateral knee osteoarthritis, left more  symptomatic than right.  Status post right total hip arthroplasty, doing well.  History of chronic pain requiring opioids.  PAIN:  Are you having pain? {OPRCPAIN:27236}  PRECAUTIONS: {Therapy precautions:24002}  RED FLAGS: {PT Red Flags:29287}   WEIGHT BEARING RESTRICTIONS: {Yes ***/No:24003}  FALLS:  Has patient fallen in last 6 months? {fallsyesno:27318}  LIVING ENVIRONMENT: Lives with: {OPRC lives with:25569::lives with their family} Lives in: {Lives in:25570} Stairs: {opstairs:27293} Has following equipment at home: {Assistive devices:23999}  OCCUPATION: ***  PLOF: {PLOF:24004}  PATIENT GOALS: ***  NEXT MD VISIT: ***  OBJECTIVE:  Note: Objective measures were completed at Evaluation unless otherwise noted.  DIAGNOSTIC FINDINGS: ***  PATIENT SURVEYS:  {rehab surveys:24030}  COGNITION: Overall cognitive status: {cognition:24006}     SENSATION: {sensation:27233}  EDEMA:  {edema:24020}  MUSCLE LENGTH: Hamstrings: Right *** deg; Left *** deg Debby test: Right *** deg; Left *** deg  POSTURE: {posture:25561}  PALPATION: ***  LOWER EXTREMITY ROM:  {AROM/PROM:27142} ROM Right eval Left eval  Hip flexion    Hip extension    Hip abduction    Hip adduction    Hip internal rotation    Hip external rotation    Knee flexion    Knee extension    Ankle dorsiflexion    Ankle plantarflexion    Ankle inversion    Ankle eversion     (Blank rows = not tested)  LOWER EXTREMITY MMT:  MMT Right eval Left eval  Hip flexion    Hip extension    Hip abduction    Hip adduction    Hip internal rotation    Hip external rotation    Knee flexion    Knee extension    Ankle dorsiflexion    Ankle plantarflexion    Ankle inversion    Ankle eversion     (Blank rows = not tested)  LOWER EXTREMITY SPECIAL TESTS:  {LEspecialtests:26242}  FUNCTIONAL TESTS:  {Functional tests:24029}  GAIT: Distance walked: *** Assistive device utilized: {Assistive  devices:23999} Level of assistance: {Levels of assistance:24026} Comments: ***                                                                                                                                TREATMENT DATE: ***    PATIENT EDUCATION:  Education details: *** Person educated: {Person educated:25204} Education method: {Education Method:25205} Education comprehension: {Education Comprehension:25206}  HOME EXERCISE PROGRAM: ***  ASSESSMENT:  CLINICAL IMPRESSION: Patient is a *** y.o. *** who was seen today for physical therapy evaluation and treatment for ***.   OBJECTIVE IMPAIRMENTS: {opptimpairments:25111}.   ACTIVITY LIMITATIONS: {activitylimitations:27494}  PARTICIPATION LIMITATIONS: {participationrestrictions:25113}  PERSONAL FACTORS: {Personal factors:25162} are also affecting patient's functional outcome.   REHAB POTENTIAL: {rehabpotential:25112}  CLINICAL DECISION MAKING: {clinical decision  making:25114}  EVALUATION COMPLEXITY: {Evaluation complexity:25115}   GOALS: Goals reviewed with patient? {yes/no:20286}  SHORT TERM GOALS: Target date: *** *** Baseline: Goal status: INITIAL  2.  *** Baseline:  Goal status: INITIAL  3.  *** Baseline:  Goal status: INITIAL  4.  *** Baseline:  Goal status: INITIAL  5.  *** Baseline:  Goal status: INITIAL  6.  *** Baseline:  Goal status: INITIAL  LONG TERM GOALS: Target date: ***  *** Baseline:  Goal status: INITIAL  2.  *** Baseline:  Goal status: INITIAL  3.  *** Baseline:  Goal status: INITIAL  4.  *** Baseline:  Goal status: INITIAL  5.  *** Baseline:  Goal status: INITIAL  6.  *** Baseline:  Goal status: INITIAL   PLAN:  PT FREQUENCY: {rehab frequency:25116}  PT DURATION: {rehab duration:25117}  PLANNED INTERVENTIONS: {rehab planned interventions:25118::97110-Therapeutic exercises,97530- Therapeutic [phone]- Neuromuscular re-education,97535- Self  Rjmz,02859- Manual therapy}  PLAN FOR NEXT SESSION: PIERRETTE Shuck Kem) Vaun Hyndman MPT 03/19/24 8:25 AM [facility] [address] [place], [place], [address] Phone: [phone]   Fax:  [phone]

## 2024-04-23 ENCOUNTER — Telehealth: Payer: Self-pay | Admitting: *Deleted

## 2024-04-23 NOTE — Telephone Encounter (Signed)
 She called wanting to know when her surgery is scheduled for, she has forgotten the date.

## 2024-05-22 ENCOUNTER — Ambulatory Visit (INDEPENDENT_AMBULATORY_CARE_PROVIDER_SITE_OTHER): Admitting: Podiatry

## 2024-05-22 VITALS — Ht 61.0 in | Wt 164.0 lb

## 2024-05-22 DIAGNOSIS — L84 Corns and callosities: Secondary | ICD-10-CM | POA: Diagnosis not present

## 2024-05-22 DIAGNOSIS — B070 Plantar wart: Secondary | ICD-10-CM | POA: Diagnosis not present

## 2024-05-22 DIAGNOSIS — M2042 Other hammer toe(s) (acquired), left foot: Secondary | ICD-10-CM | POA: Diagnosis not present

## 2024-05-22 DIAGNOSIS — M216X2 Other acquired deformities of left foot: Secondary | ICD-10-CM

## 2024-05-22 NOTE — Progress Notes (Signed)
  Subjective:  Patient ID: Diana Alvarado, female    DOB: 07-Feb-1956,  MRN: [id number]  Chief Complaint  Patient presents with   Callouses    Rm 21 Patient is here for callous trim. Callus on ball of the right and left foot. Patient states pain when walking.    68 y.o. female presents with the above complaint. History confirmed with patient.  She returns for follow-up, lesion is painful again and has some questions about surgery as a new issue on the right foot similar to the left foot  Objective:  Physical Exam: warm, good capillary refill, no trophic changes or ulcerative lesions, normal DP and PT pulses, normal sensory exam, and painful porokeratosis submetatarsal 2 on the left foot, she has lesser hammertoe deformities that are reducible of 3 4 and 5, prominent metatarsal head plantar submetatarsal 2, now has new skin lesion develop on the right foot    Assessment:   1. Plantar flexed metatarsal bone of left foot   2. Callus of foot   3. Hammertoe of left foot   4. Plantar wart      Plan:  Patient was evaluated and treated and all questions answered.  Lesion was debrided to expose the central core today and salicylic acid treatment applied to destroy the lesion.  Surgery scheduled for the bone work on the plantarflexed metatarsal a few weeks.  All questions addressed.  CAM boot dispensed for surgery and I will see her at surgery.   Surgical plan:  Procedure: -Left foot second hammertoe correction and metatarsal osteotomy, flexor tenotomy 3rd, 4th and 5th toes  Location: -[facility]  Anesthesia plan: -IV sedation with local  Postoperative pain plan: - Tylenol  1000 mg every 6 hours, gabapentin  300 mg every 8 hours x5 days, oxycodone  5 mg 1-2 tabs every 6 hours only as needed  DVT prophylaxis: -None required  WB Restrictions / DME needs: -WBAT in CAM boot postop.  CAM boot was dispensed today     No follow-ups on file.

## 2024-06-06 ENCOUNTER — Ambulatory Visit: Admitting: Pulmonary Disease

## 2024-06-06 NOTE — Progress Notes (Deleted)
 Subjective:   PATIENT ID: Diana Alvarado GENDER: female DOB: 1956-04-08, MRN: [id number]   HPI Discussed the use of AI scribe software for clinical note transcription with the patient, who gave verbal consent to proceed.  History of Present Illness            No chief complaint on file.   ***  Past Medical History:  Diagnosis Date   ADHD    Anxiety 11/17/2011   Asthma 04/19/2011   Chronic kidney disease    Stage III   Chronic LBP 04/19/2011   Depression 04/19/2011   DJD (degenerative joint disease), cervical 04/19/2011   Emphysema, unspecified (HCC)    Lumbar disc disease 04/19/2011   Pulmonary nodule    Splenic artery aneurysm    UTI (lower urinary tract infection)      Family History  Problem Relation Age of Onset   Colon cancer Maternal Aunt    Arthritis Other    Cancer Other        cancer   Diabetes Other    Esophageal cancer Neg Hx    Rectal cancer Neg Hx    Stomach cancer Neg Hx    Colon polyps Neg Hx    Crohn's disease Neg Hx    Ulcerative colitis Neg Hx      Social History   Socioeconomic History   Marital status: Married    Spouse name: Not on file   Number of children: Not on file   Years of education: 16   Highest education level: Not on file  Occupational History   Occupation: RN  Tobacco Use   Smoking status: Every Day    Current packs/day: 0.00    Average packs/day: 1 pack/day for 10.0 years (10.0 ttl pk-yrs)    Types: Cigarettes    Start date: 10/24/2011    Last attempt to quit: 10/23/2021    Years since quitting: 2.6    Passive exposure: Current (husband smokes)   Smokeless tobacco: Never  Vaping Use   Vaping status: Never Used  Substance and Sexual Activity   Alcohol use: No   Drug use: No   Sexual activity: Not on file  Other Topics Concern   Not on file  Social History Narrative   Left Handed   Lives in a two story home. Never really gores upstairs.   Drinks Caffeine    Social Drivers of Corporate investment banker  Strain: Low Risk  (02/18/2024)   Received from [facility]   Overall Financial Resource Strain (CARDIA)    Difficulty of Paying Living Expenses: Not hard at all  Food Insecurity: No Food Insecurity (02/18/2024)   Received from [facility]   Hunger Vital Sign    Within the past 12 months, you worried that your food would run out before you got the money to buy more.: Never true    Within the past 12 months, the food you bought just didn't last and you didn't have money to get more.: Never true  Transportation Needs: No Transportation Needs (02/18/2024)   Received from [facility] - Transportation    Lack of Transportation (Medical): No    Lack of Transportation (Non-Medical): No  Physical Activity: Inactive (02/18/2024)   Received from [facility]   Exercise Vital Sign    On average, how many days per week do you engage in moderate to strenuous exercise (like a brisk walk)?: 0 days    On average, how many minutes do you  engage in exercise at this level?: 10 min  Stress: No Stress Concern Present (02/18/2024)   Received from [facility] - Occupational Stress Questionnaire    Feeling of Stress : Only a little  Social Connections: Moderately Integrated (02/18/2024)   Received [facility]   Social Network    How would you rate your social network (family, work, friends)?: Adequate participation with social networks  Intimate Partner Violence: Not At Risk (02/18/2024)   Received from [facility]   HITS    Over the last 12 months how often did your partner physically hurt you?: Never    Over the last 12 months how often did your partner insult you or talk down to you?: Rarely    Over the last 12 months how often did your partner threaten you with physical harm?: Never    Over the last 12 months how often did your partner scream or curse at you?: Rarely     Allergies  Allergen Reactions   Aspirin Hives   Sulfonamide  Derivatives Hives     Outpatient Medications Prior to Visit  Medication Sig Dispense Refill   acetaminophen  (TYLENOL ) 500 MG tablet Take 500 mg by mouth every 6 (six) hours as needed for mild pain or moderate pain.     ALPRAZolam  (XANAX ) 0.5 MG tablet Take 0.5-1 tablets (0.25-0.5 mg total) by mouth 2 (two) times daily as needed. Must last 30 days 30 tablet 3   amphetamine -dextroamphetamine  (ADDERALL) 30 MG tablet Take 1 tablet by mouth 2 (two) times daily with breakfast and lunch. 60 tablet 0   amphetamine -dextroamphetamine  (ADDERALL) 30 MG tablet Take 1 tablet by mouth 2 (two) times daily with breakfast and lunch. 60 tablet 0   amphetamine -dextroamphetamine  (ADDERALL) 30 MG tablet Take 1 tablet by mouth 2 (two) times daily with breakfast and lunch. 60 tablet 0   ARIPiprazole  (ABILIFY ) 10 MG tablet TAKE 1 TABLET(10 MG) BY MOUTH DAILY 90 tablet 0   docusate sodium  (COLACE) 100 MG capsule Take 1 capsule (100 mg total) by mouth 2 (two) times daily. 60 capsule 0   DULoxetine  (CYMBALTA ) 60 MG capsule TAKE 1 CAPSULE(60 MG) BY MOUTH DAILY 90 capsule 0   fluticasone  furoate-vilanterol (BREO ELLIPTA ) 100-25 MCG/INH AEPB Inhale 1 puff into the lungs daily at 12 noon.     hydrOXYzine  (ATARAX /VISTARIL ) 10 MG tablet TAKE 1 TO 3 TABLETS(10 TO 30 MG) BY MOUTH THREE TIMES DAILY AS NEEDED FOR ANXIETY 60 tablet 1   ipratropium-albuterol  (DUONEB) 0.5-2.5 (3) MG/3ML SOLN Take 3 mLs by nebulization every 6 (six) hours as needed. 360 mL 0   lidocaine  (XYLOCAINE ) 5 % ointment Apply 1 Application topically as needed. Do not apply to eyelid 35.44 g 0   NARCAN  4 MG/0.1ML LIQD nasal spray kit Place 4 mg into the nose once.     ondansetron  (ZOFRAN ) 4 MG tablet Take 1 tablet (4 mg total) by mouth every 6 (six) hours as needed for nausea. 20 tablet 0   Oxycodone  HCl 10 MG TABS Take 10 mg by mouth 5 (five) times daily as needed.     senna (SENOKOT) 8.6 MG TABS tablet Take 1 tablet (8.6 mg total) by mouth 2 (two) times daily.  120 tablet 0   traZODone  (DESYREL ) 100 MG tablet TAKE 1 TO 2 TABLETS(100 TO 200 MG) BY MOUTH AT BEDTIME AS NEEDED FOR SLEEP 60 tablet 5   Varenicline  Tartrate, Starter, (CHANTIX  STARTING MONTH PAK) 0.5 MG X 11 &  1 MG X 42 TBPK Use starter pack as directed 1 each 0   Facility-Administered Medications Prior to Visit  Medication Dose Route Frequency Provider Last Rate Last Admin   0.9 %  sodium chloride  infusion  500 mL Intravenous Once Abran Norleen SAILOR, MD        Review of Systems  Constitutional:  Negative for chills, fever, malaise/fatigue and weight loss.  HENT:  Negative for congestion, sinus pain and sore throat.   Eyes: Negative.   Respiratory:  Negative for cough, hemoptysis, sputum production, shortness of breath and wheezing.   Cardiovascular:  Negative for chest pain, palpitations, orthopnea, claudication and leg swelling.  Gastrointestinal:  Negative for abdominal pain, heartburn, nausea and vomiting.  Genitourinary: Negative.   Musculoskeletal:  Negative for joint pain and myalgias.  Skin:  Negative for rash.  Neurological:  Negative for weakness.  Endo/Heme/Allergies: Negative.   Psychiatric/Behavioral: Negative.        Objective:  There were no vitals filed for this visit.   Physical Exam Constitutional:      General: She is not in acute distress.    Appearance: Normal appearance.  Eyes:     General: No scleral icterus.    Conjunctiva/sclera: Conjunctivae normal.  Cardiovascular:     Rate and Rhythm: Normal rate and regular rhythm.  Pulmonary:     Breath sounds: No wheezing, rhonchi or rales.  Musculoskeletal:     Right lower leg: No edema.     Left lower leg: No edema.  Skin:    General: Skin is warm and dry.  Neurological:     General: No focal deficit present.       CBC    Component Value Date/Time   WBC 9.7 12/31/2020 0434   RBC 3.29 (L) 12/31/2020 0434   HGB 9.6 (L) 12/31/2020 0434   HCT 30.3 (L) 12/31/2020 0434   PLT 238 12/31/2020 0434    MCV 92.1 12/31/2020 0434   MCH 29.2 12/31/2020 0434   MCHC 31.7 12/31/2020 0434   RDW 13.6 12/31/2020 0434   LYMPHSABS 3.3 12/31/2020 0434   MONOABS 0.8 12/31/2020 0434   EOSABS 0.0 12/31/2020 0434   BASOSABS 0.0 12/31/2020 0434     Chest imaging:  PFT:     No data to display          Labs:  Path:  Echo:  Heart Catheterization:       Assessment & Plan:   No diagnosis found. Assessment and Plan              Discussion: ***  Dorn Chill, MD [facility] Office: [phone]   Current Outpatient Medications:    acetaminophen  (TYLENOL ) 500 MG tablet, Take 500 mg by mouth every 6 (six) hours as needed for mild pain or moderate pain., Disp: , Rfl:    ALPRAZolam  (XANAX ) 0.5 MG tablet, Take 0.5-1 tablets (0.25-0.5 mg total) by mouth 2 (two) times daily as needed. Must last 30 days, Disp: 30 tablet, Rfl: 3   amphetamine -dextroamphetamine  (ADDERALL) 30 MG tablet, Take 1 tablet by mouth 2 (two) times daily with breakfast and lunch., Disp: 60 tablet, Rfl: 0   amphetamine -dextroamphetamine  (ADDERALL) 30 MG tablet, Take 1 tablet by mouth 2 (two) times daily with breakfast and lunch., Disp: 60 tablet, Rfl: 0   amphetamine -dextroamphetamine  (ADDERALL) 30 MG tablet, Take 1 tablet by mouth 2 (two) times daily with breakfast and lunch., Disp: 60 tablet, Rfl: 0   ARIPiprazole  (ABILIFY ) 10 MG tablet, TAKE 1 TABLET(10  MG) BY MOUTH DAILY, Disp: 90 tablet, Rfl: 0   docusate sodium  (COLACE) 100 MG capsule, Take 1 capsule (100 mg total) by mouth 2 (two) times daily., Disp: 60 capsule, Rfl: 0   DULoxetine  (CYMBALTA ) 60 MG capsule, TAKE 1 CAPSULE(60 MG) BY MOUTH DAILY, Disp: 90 capsule, Rfl: 0   fluticasone  furoate-vilanterol (BREO ELLIPTA ) 100-25 MCG/INH AEPB, Inhale 1 puff into the lungs daily at 12 noon., Disp: , Rfl:    hydrOXYzine  (ATARAX /VISTARIL ) 10 MG tablet, TAKE 1 TO 3 TABLETS(10 TO 30 MG) BY MOUTH THREE TIMES DAILY AS NEEDED FOR ANXIETY, Disp: 60  tablet, Rfl: 1   ipratropium-albuterol  (DUONEB) 0.5-2.5 (3) MG/3ML SOLN, Take 3 mLs by nebulization every 6 (six) hours as needed., Disp: 360 mL, Rfl: 0   lidocaine  (XYLOCAINE ) 5 % ointment, Apply 1 Application topically as needed. Do not apply to eyelid, Disp: 35.44 g, Rfl: 0   NARCAN  4 MG/0.1ML LIQD nasal spray kit, Place 4 mg into the nose once., Disp: , Rfl:    ondansetron  (ZOFRAN ) 4 MG tablet, Take 1 tablet (4 mg total) by mouth every 6 (six) hours as needed for nausea., Disp: 20 tablet, Rfl: 0   Oxycodone  HCl 10 MG TABS, Take 10 mg by mouth 5 (five) times daily as needed., Disp: , Rfl:    senna (SENOKOT) 8.6 MG TABS tablet, Take 1 tablet (8.6 mg total) by mouth 2 (two) times daily., Disp: 120 tablet, Rfl: 0   traZODone  (DESYREL ) 100 MG tablet, TAKE 1 TO 2 TABLETS(100 TO 200 MG) BY MOUTH AT BEDTIME AS NEEDED FOR SLEEP, Disp: 60 tablet, Rfl: 5   Varenicline  Tartrate, Starter, (CHANTIX  STARTING MONTH PAK) 0.5 MG X 11 & 1 MG X 42 TBPK, Use starter pack as directed, Disp: 1 each, Rfl: 0  Current Facility-Administered Medications:    0.9 %  sodium chloride  infusion, 500 mL, Intravenous, Once, Abran Norleen SAILOR, MD

## 2024-06-10 ENCOUNTER — Telehealth: Payer: Self-pay | Admitting: Lab

## 2024-06-10 NOTE — Telephone Encounter (Signed)
 Patient states has surgery scheduled for this 06-21-24 has to cancel due to death in family wants to reschedule for end of month please contact patient.

## 2024-06-11 ENCOUNTER — Telehealth: Payer: Self-pay | Admitting: Podiatry

## 2024-06-11 NOTE — Telephone Encounter (Signed)
 Patient called in and rescheduled surgery for 2024/06/15 to 29-Jun-2024 due to death in the family. Have called and notified Montie at the surgery center of the date change and rescheduled post-op appointments.

## 2024-06-19 ENCOUNTER — Encounter

## 2024-06-19 ENCOUNTER — Ambulatory Visit

## 2024-06-19 VITALS — BP 117/71 | HR 88 | Temp 98.0°F | Ht 61.0 in | Wt 166.0 lb

## 2024-06-19 DIAGNOSIS — R0989 Other specified symptoms and signs involving the circulatory and respiratory systems: Secondary | ICD-10-CM

## 2024-06-19 DIAGNOSIS — F17210 Nicotine dependence, cigarettes, uncomplicated: Secondary | ICD-10-CM | POA: Diagnosis not present

## 2024-06-19 DIAGNOSIS — G4730 Sleep apnea, unspecified: Secondary | ICD-10-CM

## 2024-06-19 DIAGNOSIS — J45909 Unspecified asthma, uncomplicated: Secondary | ICD-10-CM | POA: Diagnosis not present

## 2024-06-19 DIAGNOSIS — J45901 Unspecified asthma with (acute) exacerbation: Secondary | ICD-10-CM

## 2024-06-19 NOTE — Patient Instructions (Addendum)
 Call 1-800-quit-NOW to get free nicotine  replacement and counseling from the state of [place]      VISIT SUMMARY: Today, we discussed your respiratory symptoms and sleep apnea management. You have a history of sleep apnea and asthma, and you are currently experiencing respiratory issues. We reviewed your smoking history and discussed strategies for smoking cessation. We also talked about your annual lung cancer screening.  YOUR PLAN: -ASTHMA VERSUS CHRONIC OBSTRUCTIVE PULMONARY DISEASE (COPD): You have chronic respiratory symptoms and a long history of smoking, which could be due to asthma or COPD. We will continue your current medications, including Symbicort and albuterol , and use a nebulizer if needed. We recommend getting flu and pneumonia vaccinations to prevent infections.  -NICOTINE  DEPENDENCE, CIGARETTES: You have a long history of smoking and are currently using nicotine  patches. We discussed adding nicotine  lozenges to help manage cravings. We also provided information on 1-800-QUIT-NOW for additional support. If nicotine  replacement therapy is not enough, we may consider prescribing Wellbutrin.  -MILD OBSTRUCTIVE SLEEP APNEA: You have mild obstructive sleep apnea and are using an oral appliance since you couldn't tolerate the CPAP machine. This should help you sleep through the night without interruptions.  -ANNUAL LUNG CANCER SCREENING: You are undergoing annual lung cancer screening with CT scans. Your last scan in August 2025 showed no significant findings. We will schedule your next scan for August 2026.  INSTRUCTIONS: Please follow up with repeat pulmonary function tests (PFTs) if you continue care here. Continue using your Symbicort inhaler as prescribed, and use your albuterol  inhaler and nebulizer as needed. Make sure to get your flu and pneumonia vaccinations. For smoking cessation, use nicotine  lozenges along with patches and contact 1-800-QUIT-NOW for support. If needed,  we can discuss prescribing Wellbutrin. Schedule your next lung cancer screening CT scan for August 2026.                      Contains text generated by [employer]bridge.                                 Contains text generated by [employer]bridge.

## 2024-06-19 NOTE — Progress Notes (Signed)
 Subjective:   PATIENT ID: Diana Alvarado GENDER: female DOB: 1956/07/25, MRN: [id number]   HPI Discussed the use of AI scribe software for clinical note transcription with the patient, who gave verbal consent to proceed.  History of Present Illness Diana Alvarado is a 67 year old female with sleep apnea and asthma who presents for evaluation of her respiratory symptoms and sleep apnea management. She was referred by her primary care provider, for further evaluation.  She has a history of sleep apnea, initially diagnosed as mild. She previously attempted to use a CPAP machine but was unable to tolerate it. She is planning to start using an oral appliance to manage her condition, with the goal of sleeping through the night without interruptions.  She reports that about a year ago, her respiratory symptoms, previously described as bad colds, were thought to be asthma. This year, she experienced a significant episode of bronchitis lasting seven weeks, requiring two rounds of antibiotics and a prednisone  taper. She uses a nebulizer during exacerbations and has been prescribed Symbicort, which she reports is at home, and also has a rescue inhaler, albuterol . She experiences shortness of breath after walking a mile and finds it difficult to walk uphill or climb stairs. No daily cough, but she reports a nonproductive cough during bronchitis episodes, with associated mild night sweats but no fever or chills.  She has a significant smoking history, having smoked since age 71, with a current habit of half a pack per day. She has attempted to quit multiple times, using nicotine  patches and previously Chantix , which caused nausea and nightmares. She is currently using nicotine  patches and considering lozenges to aid in smoking cessation.  Her grandchildren, aged 78 and five, frequently visit and often have colds, which she believes contributes to her respiratory issues.  I am not able to see her PFTs that were  done at [facility] but described as having hyperinflation and air trapping.  Presumed normal spirometry otherwise.     Past Medical History:  Diagnosis Date   ADHD    Anxiety 11/17/2011   Asthma 04/19/2011   Chronic kidney disease    Stage III   Chronic LBP 04/19/2011   Depression 04/19/2011   DJD (degenerative joint disease), cervical 04/19/2011   Emphysema, unspecified (HCC)    Lumbar disc disease 04/19/2011   Pulmonary nodule    Splenic artery aneurysm    UTI (lower urinary tract infection)      Family History  Problem Relation Age of Onset   Colon cancer Maternal Aunt    Arthritis Other    Cancer Other        cancer   Diabetes Other    Esophageal cancer Neg Hx    Rectal cancer Neg Hx    Stomach cancer Neg Hx    Colon polyps Neg Hx    Crohn's disease Neg Hx    Ulcerative colitis Neg Hx      Social History   Socioeconomic History   Marital status: Married    Spouse name: Not on file   Number of children: Not on file   Years of education: 16   Highest education level: Not on file  Occupational History   Occupation: RN  Tobacco Use   Smoking status: Every Day    Current packs/day: 0.50    Average packs/day: 0.9 packs/day for 12.7 years (11.8 ttl pk-yrs)    Types: Cigarettes    Start date: 10/24/2011    Passive exposure: Current (husband  smokes)   Smokeless tobacco: Never  Vaping Use   Vaping status: Never Used  Substance and Sexual Activity   Alcohol use: No   Drug use: No   Sexual activity: Not on file  Other Topics Concern   Not on file  Social History Narrative   Left Handed   Lives in a two story home. Never really gores upstairs.   Drinks Caffeine    Social Drivers of Corporate investment banker Strain: Low Risk  (02/18/2024)   Received from [facility]   Overall Financial Resource Strain (CARDIA)    Difficulty of Paying Living Expenses: Not hard at all  Food Insecurity: No Food Insecurity (02/18/2024)   [facility]   Hunger  Vital Sign    Within the past 12 months, you worried that your food would run out before you got the money to buy more.: Never true    Within the past 12 months, the food you bought just didn't last and you didn't have money to get more.: Never true  Transportation Needs: No Transportation Needs (02/18/2024)   Received from [facility] - Transportation    Lack of Transportation (Medical): No    Lack of Transportation (Non-Medical): No  Physical Activity: Inactive (02/18/2024)   Received [facility]   Exercise Vital Sign    On average, how many days per week do you engage in moderate to strenuous exercise (like a brisk walk)?: 0 days    On average, how many minutes do you engage in exercise at this level?: 10 min  Stress: No Stress Concern Present (02/18/2024)   Received from [facility] - Occupational Stress Questionnaire    Feeling of Stress : Only a little  Social Connections: Moderately Integrated (02/18/2024)   Received from [facility]   Social Network    How would you rate your social network (family, work, friends)?: Adequate participation with social networks  Intimate Partner Violence: Not At Risk (02/18/2024)   Received from [facility]   HITS    Over the last 12 months how often did your partner physically hurt you?: Never    Over the last 12 months how often did your partner insult you or talk down to you?: Rarely    Over the last 12 months how often did your partner threaten you with physical harm?: Never    Over the last 12 months how often did your partner scream or curse at you?: Rarely     Allergies  Allergen Reactions   Aspirin Hives   Sulfonamide Derivatives Hives     Outpatient Medications Prior to Visit  Medication Sig Dispense Refill   acetaminophen  (TYLENOL ) 500 MG tablet Take 500 mg by mouth every 6 (six) hours as needed for mild pain or moderate pain.     ALPRAZolam  (XANAX ) 0.5 MG tablet  Take 0.5-1 tablets (0.25-0.5 mg total) by mouth 2 (two) times daily as needed. Must last 30 days 30 tablet 3   amphetamine -dextroamphetamine  (ADDERALL) 30 MG tablet Take 1 tablet by mouth 2 (two) times daily with breakfast and lunch. 60 tablet 0   ARIPiprazole  (ABILIFY ) 10 MG tablet TAKE 1 TABLET(10 MG) BY MOUTH DAILY 90 tablet 0   docusate sodium  (COLACE) 100 MG capsule Take 1 capsule (100 mg total) by mouth 2 (two) times daily. 60 capsule 0   DULoxetine  (CYMBALTA ) 60 MG capsule TAKE 1 CAPSULE(60 MG) BY MOUTH DAILY 90 capsule 0  ipratropium-albuterol  (DUONEB) 0.5-2.5 (3) MG/3ML SOLN Take 3 mLs by nebulization every 6 (six) hours as needed. 360 mL 0   lidocaine  (XYLOCAINE ) 5 % ointment Apply 1 Application topically as needed. Do not apply to eyelid 35.44 g 0   NARCAN  4 MG/0.1ML LIQD nasal spray kit Place 4 mg into the nose once.     ondansetron  (ZOFRAN ) 4 MG tablet Take 1 tablet (4 mg total) by mouth every 6 (six) hours as needed for nausea. 20 tablet 0   Varenicline  Tartrate, Starter, (CHANTIX  STARTING MONTH PAK) 0.5 MG X 11 & 1 MG X 42 TBPK Use starter pack as directed 1 each 0   senna (SENOKOT) 8.6 MG TABS tablet Take 1 tablet (8.6 mg total) by mouth 2 (two) times daily. (Patient not taking: Reported on 06/19/2024) 120 tablet 0   amphetamine -dextroamphetamine  (ADDERALL) 30 MG tablet Take 1 tablet by mouth 2 (two) times daily with breakfast and lunch. 60 tablet 0   amphetamine -dextroamphetamine  (ADDERALL) 30 MG tablet Take 1 tablet by mouth 2 (two) times daily with breakfast and lunch. 60 tablet 0   fluticasone  furoate-vilanterol (BREO ELLIPTA ) 100-25 MCG/INH AEPB Inhale 1 puff into the lungs daily at 12 noon.     hydrOXYzine  (ATARAX /VISTARIL ) 10 MG tablet TAKE 1 TO 3 TABLETS(10 TO 30 MG) BY MOUTH THREE TIMES DAILY AS NEEDED FOR ANXIETY 60 tablet 1   Oxycodone  HCl 10 MG TABS Take 10 mg by mouth 5 (five) times daily as needed.     traZODone  (DESYREL ) 100 MG tablet TAKE 1 TO 2 TABLETS(100 TO 200 MG)  BY MOUTH AT BEDTIME AS NEEDED FOR SLEEP 60 tablet 5   Facility-Administered Medications Prior to Visit  Medication Dose Route Frequency Provider Last Rate Last Admin   0.9 %  sodium chloride  infusion  500 mL Intravenous Once Abran Norleen SAILOR, MD        ROS Reviewed all systems and reported negative except as above     Objective:   Vitals:   06/19/24 0932  BP: 117/71  Pulse: 88  Temp: 98 F (36.7 C)  TempSrc: Oral  SpO2: 95%  Weight: 166 lb (75.3 kg)  Height: 5' 1 (1.549 m)    Physical Exam Constitutional:      Appearance: Normal appearance.  HENT:     Head: Normocephalic and atraumatic.     Nose: Nose normal.     Mouth/Throat:     Mouth: Mucous membranes are moist.  Cardiovascular:     Rate and Rhythm: Normal rate and regular rhythm.     Heart sounds: Murmur heard.  Pulmonary:     Effort: Pulmonary effort is normal.     Breath sounds: Normal breath sounds.  Abdominal:     General: Abdomen is flat.  Musculoskeletal:        General: Normal range of motion.  Skin:    General: Skin is warm.     Capillary Refill: Capillary refill takes less than 2 seconds.  Neurological:     General: No focal deficit present.     Mental Status: She is alert.    Physical Exam CHEST: Lungs clear to auscultation, no wheezing, no crackles. CARDIOVASCULAR: Heart sounds normal, possible tiny murmur. EXTREMITIES: No swelling in the legs.     CBC    Component Value Date/Time   WBC 9.7 12/31/2020 0434   RBC 3.29 (L) 12/31/2020 0434   HGB 9.6 (L) 12/31/2020 0434   HCT 30.3 (L) 12/31/2020 0434   PLT 238 12/31/2020 0434  MCV 92.1 12/31/2020 0434   MCH 29.2 12/31/2020 0434   MCHC 31.7 12/31/2020 0434   RDW 13.6 12/31/2020 0434   LYMPHSABS 3.3 12/31/2020 0434   MONOABS 0.8 12/31/2020 0434   EOSABS 0.0 12/31/2020 0434   BASOSABS 0.0 12/31/2020 0434     Chest imaging: I reviewed the CT chest performed in 2022 which does not show any evidence of emphysema or lung nodules.  At  that time she had a pneumonia.  There are multiple CAT scans since then that are performed at a different facility which do not show any concerning findings  PFT: No PFTs on file         Assessment & Plan:   Assessment and Plan Assessment & Plan Asthma versus Chronic Obstructive Pulmonary Disease (COPD) Chronic respiratory symptoms with a 50-year smoking history. Differential diagnosis includes asthma and COPD. Previous PFTs showed hyperinflation and air trapping, consistent with both conditions. Further evaluation needed. - Order repeat pulmonary function tests (PFTs) if she continues care here, alternatively can continue getting her pulmonary care at [facility] if she chooses to. - Continue Symbicort inhaler, two puffs twice daily. - Use albuterol  as a rescue inhaler if needed. - Use nebulizer if symptoms persist despite inhaler use. - Encourage flu and pneumonia vaccinations (patient states that she has already received those).  Nicotine  dependence, cigarettes Long history of smoking, currently smoking half a pack per day. Previously used Chantix  with adverse effects. Currently using nicotine  patches and considering lozenges. Discussed combination therapy with patches and lozenges. - Encourage use of nicotine  lozenges with patches to manage cravings. - Provide information on 1-800-QUIT-NOW for support. - Consider Wellbutrin if nicotine  replacement therapy is insufficient.    Mild obstructive sleep apnea Previously diagnosed with mild obstructive sleep apnea. Unable to tolerate CPAP, using an oral appliance instead.  Annual lung cancer screening Undergoing annual lung cancer screening with CT scans. Last CT scan in August 2025 showed no significant findings. - Schedule next lung cancer screening CT scan for August 2026 if she continues care here.  Smoking/Tobacco Cessation Counseling Shenise Wolgamott is a current user of tobacco or nicotine  products. She is considering quitting at this  time. Counseling provided today addressed the risks of continued use and the benefits of cessation. Discussed tobacco/nicotine  use history, readiness to quit, and evidence-based treatment options including behavioral strategies, support resources, and pharmacologic therapies. Provided encouragement and educational materials on steps and resources to quit smoking. Patient questions were addressed, and follow-up recommended for continued support. Total time spent on counseling: 4 minutes.        Zola Herter, MD [facility] Office: [phone]

## 2024-06-26 ENCOUNTER — Telehealth: Payer: Self-pay | Admitting: Lab

## 2024-06-26 ENCOUNTER — Telehealth: Payer: Self-pay | Admitting: Podiatry

## 2024-06-26 NOTE — Telephone Encounter (Signed)
 Patient calling to cancel surgery her husband had a heart attack and is unable to have surgery tomorrow.

## 2024-06-26 NOTE — Telephone Encounter (Signed)
 Called and left message for patient as she had notified [facility] that she needed to cancel surgery due to her husband having a heart attack. Left message stating patient could reach back out to office to reschedule when ready if she chose to.

## 2024-07-03 ENCOUNTER — Encounter

## 2024-07-17 ENCOUNTER — Encounter

## 2024-07-24 ENCOUNTER — Encounter: Admitting: Podiatry

## 2024-08-07 ENCOUNTER — Encounter: Admitting: Podiatry
